# Patient Record
Sex: Female | Born: 1965 | Race: White | Hispanic: No | Marital: Married | Smoking: Never smoker
Health system: Southern US, Community
[De-identification: ages and names within clinical notes are randomized; demographics above are authoritative.]

## PROBLEM LIST (undated history)

## (undated) DIAGNOSIS — E782 Mixed hyperlipidemia: Secondary | ICD-10-CM

## (undated) DIAGNOSIS — T7840XA Allergy, unspecified, initial encounter: Secondary | ICD-10-CM

## (undated) DIAGNOSIS — K760 Fatty (change of) liver, not elsewhere classified: Secondary | ICD-10-CM

## (undated) DIAGNOSIS — R079 Chest pain, unspecified: Secondary | ICD-10-CM

## (undated) DIAGNOSIS — G4730 Sleep apnea, unspecified: Secondary | ICD-10-CM

## (undated) DIAGNOSIS — R002 Palpitations: Secondary | ICD-10-CM

## (undated) DIAGNOSIS — N979 Female infertility, unspecified: Secondary | ICD-10-CM

## (undated) DIAGNOSIS — K219 Gastro-esophageal reflux disease without esophagitis: Secondary | ICD-10-CM

## (undated) DIAGNOSIS — R1013 Epigastric pain: Secondary | ICD-10-CM

## (undated) DIAGNOSIS — F419 Anxiety disorder, unspecified: Secondary | ICD-10-CM

## (undated) DIAGNOSIS — F32A Depression, unspecified: Secondary | ICD-10-CM

## (undated) DIAGNOSIS — S0300XA Dislocation of jaw, unspecified side, initial encounter: Secondary | ICD-10-CM

## (undated) HISTORY — DX: Allergy, unspecified, initial encounter: T78.40XA

## (undated) HISTORY — DX: Dislocation of jaw, unspecified side, initial encounter: S03.00XA

## (undated) HISTORY — DX: Anxiety disorder, unspecified: F41.9

## (undated) HISTORY — DX: Mixed hyperlipidemia: E78.2

## (undated) HISTORY — DX: Palpitations: R00.2

## (undated) HISTORY — DX: Chest pain, unspecified: R07.9

## (undated) HISTORY — DX: Female infertility, unspecified: N97.9

## (undated) HISTORY — PX: BREAST BIOPSY: SHX20

## (undated) HISTORY — DX: Sleep apnea, unspecified: G47.30

## (undated) HISTORY — DX: Fatty (change of) liver, not elsewhere classified: K76.0

## (undated) HISTORY — DX: Depression, unspecified: F32.A

---
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
## 1998-08-13 ENCOUNTER — Other Ambulatory Visit: Admission: RE | Admit: 1998-08-13 | Discharge: 1998-08-13 | Payer: Self-pay | Admitting: *Deleted

## 1999-09-01 ENCOUNTER — Other Ambulatory Visit: Admission: RE | Admit: 1999-09-01 | Discharge: 1999-09-01 | Payer: Self-pay | Admitting: Obstetrics and Gynecology

## 2000-07-02 ENCOUNTER — Inpatient Hospital Stay (HOSPITAL_COMMUNITY): Admission: AD | Admit: 2000-07-02 | Discharge: 2000-07-04 | Payer: Self-pay | Admitting: Obstetrics and Gynecology

## 2000-08-08 ENCOUNTER — Other Ambulatory Visit: Admission: RE | Admit: 2000-08-08 | Discharge: 2000-08-08 | Payer: Self-pay | Admitting: Obstetrics and Gynecology

## 2001-08-23 ENCOUNTER — Other Ambulatory Visit: Admission: RE | Admit: 2001-08-23 | Discharge: 2001-08-23 | Payer: Self-pay | Admitting: Obstetrics and Gynecology

## 2002-04-19 ENCOUNTER — Encounter: Admission: RE | Admit: 2002-04-19 | Discharge: 2002-04-19 | Payer: Self-pay | Admitting: Obstetrics and Gynecology

## 2002-04-19 ENCOUNTER — Encounter: Payer: Self-pay | Admitting: Obstetrics and Gynecology

## 2002-06-13 ENCOUNTER — Ambulatory Visit (HOSPITAL_COMMUNITY): Admission: RE | Admit: 2002-06-13 | Discharge: 2002-06-13 | Payer: Self-pay | Admitting: Family Medicine

## 2002-06-13 ENCOUNTER — Encounter: Payer: Self-pay | Admitting: Family Medicine

## 2002-06-18 ENCOUNTER — Encounter (HOSPITAL_COMMUNITY): Admission: RE | Admit: 2002-06-18 | Discharge: 2002-07-18 | Payer: Self-pay | Admitting: Family Medicine

## 2002-09-11 ENCOUNTER — Other Ambulatory Visit: Admission: RE | Admit: 2002-09-11 | Discharge: 2002-09-11 | Payer: Self-pay | Admitting: Obstetrics and Gynecology

## 2002-09-18 ENCOUNTER — Ambulatory Visit (HOSPITAL_COMMUNITY): Admission: RE | Admit: 2002-09-18 | Discharge: 2002-09-18 | Payer: Self-pay | Admitting: Neurosurgery

## 2002-09-18 ENCOUNTER — Encounter: Payer: Self-pay | Admitting: Neurosurgery

## 2002-09-19 ENCOUNTER — Ambulatory Visit (HOSPITAL_COMMUNITY): Admission: RE | Admit: 2002-09-19 | Discharge: 2002-09-19 | Payer: Self-pay | Admitting: Neurosurgery

## 2002-09-20 ENCOUNTER — Encounter: Payer: Self-pay | Admitting: Neurosurgery

## 2002-10-02 ENCOUNTER — Encounter: Admission: RE | Admit: 2002-10-02 | Discharge: 2002-10-02 | Payer: Self-pay | Admitting: Neurosurgery

## 2002-10-02 ENCOUNTER — Encounter: Payer: Self-pay | Admitting: Neurosurgery

## 2002-10-15 ENCOUNTER — Encounter: Admission: RE | Admit: 2002-10-15 | Discharge: 2002-10-15 | Payer: Self-pay | Admitting: Neurosurgery

## 2002-10-15 ENCOUNTER — Encounter: Payer: Self-pay | Admitting: Neurosurgery

## 2002-11-06 ENCOUNTER — Encounter: Admission: RE | Admit: 2002-11-06 | Discharge: 2002-11-06 | Payer: Self-pay | Admitting: Neurosurgery

## 2002-11-06 ENCOUNTER — Encounter: Payer: Self-pay | Admitting: Neurosurgery

## 2003-04-04 ENCOUNTER — Encounter: Payer: Self-pay | Admitting: Radiology

## 2003-04-04 ENCOUNTER — Encounter: Payer: Self-pay | Admitting: Neurosurgery

## 2003-04-04 ENCOUNTER — Encounter: Admission: RE | Admit: 2003-04-04 | Discharge: 2003-04-04 | Payer: Self-pay | Admitting: Neurosurgery

## 2003-08-07 ENCOUNTER — Ambulatory Visit (HOSPITAL_COMMUNITY): Admission: RE | Admit: 2003-08-07 | Discharge: 2003-08-07 | Payer: Self-pay | Admitting: Internal Medicine

## 2003-09-26 ENCOUNTER — Other Ambulatory Visit: Admission: RE | Admit: 2003-09-26 | Discharge: 2003-09-26 | Payer: Self-pay | Admitting: Obstetrics and Gynecology

## 2005-01-26 ENCOUNTER — Other Ambulatory Visit: Admission: RE | Admit: 2005-01-26 | Discharge: 2005-01-26 | Payer: Self-pay | Admitting: Obstetrics and Gynecology

## 2005-01-26 ENCOUNTER — Encounter: Admission: RE | Admit: 2005-01-26 | Discharge: 2005-01-26 | Payer: Self-pay | Admitting: Obstetrics and Gynecology

## 2005-02-23 ENCOUNTER — Ambulatory Visit (HOSPITAL_COMMUNITY): Admission: RE | Admit: 2005-02-23 | Discharge: 2005-02-23 | Payer: Self-pay | Admitting: Family Medicine

## 2005-10-10 HISTORY — PX: KNEE SURGERY: SHX244

## 2006-02-16 ENCOUNTER — Encounter: Admission: RE | Admit: 2006-02-16 | Discharge: 2006-02-16 | Payer: Self-pay | Admitting: Obstetrics and Gynecology

## 2006-02-16 ENCOUNTER — Other Ambulatory Visit: Admission: RE | Admit: 2006-02-16 | Discharge: 2006-02-16 | Payer: Self-pay | Admitting: Obstetrics and Gynecology

## 2006-05-17 ENCOUNTER — Ambulatory Visit (HOSPITAL_BASED_OUTPATIENT_CLINIC_OR_DEPARTMENT_OTHER): Admission: RE | Admit: 2006-05-17 | Discharge: 2006-05-17 | Payer: Self-pay | Admitting: Orthopedic Surgery

## 2007-03-08 ENCOUNTER — Encounter: Admission: RE | Admit: 2007-03-08 | Discharge: 2007-03-08 | Payer: Self-pay | Admitting: Obstetrics and Gynecology

## 2007-11-19 ENCOUNTER — Ambulatory Visit (HOSPITAL_COMMUNITY): Admission: RE | Admit: 2007-11-19 | Discharge: 2007-11-19 | Payer: Self-pay | Admitting: Family Medicine

## 2008-04-16 ENCOUNTER — Encounter: Admission: RE | Admit: 2008-04-16 | Discharge: 2008-04-16 | Payer: Self-pay | Admitting: Obstetrics and Gynecology

## 2008-10-10 HISTORY — PX: BLADDER REPAIR: SHX76

## 2009-01-13 ENCOUNTER — Ambulatory Visit (HOSPITAL_COMMUNITY): Admission: RE | Admit: 2009-01-13 | Discharge: 2009-01-13 | Payer: Self-pay | Admitting: Family Medicine

## 2009-08-20 ENCOUNTER — Ambulatory Visit (HOSPITAL_COMMUNITY): Admission: RE | Admit: 2009-08-20 | Discharge: 2009-08-21 | Payer: Self-pay | Admitting: Obstetrics and Gynecology

## 2009-10-08 ENCOUNTER — Encounter: Admission: RE | Admit: 2009-10-08 | Discharge: 2009-10-08 | Payer: Self-pay | Admitting: Obstetrics and Gynecology

## 2010-09-27 ENCOUNTER — Ambulatory Visit: Payer: Self-pay | Admitting: Internal Medicine

## 2010-10-13 ENCOUNTER — Encounter
Admission: RE | Admit: 2010-10-13 | Discharge: 2010-10-13 | Payer: Self-pay | Source: Home / Self Care | Attending: Obstetrics and Gynecology | Admitting: Obstetrics and Gynecology

## 2010-10-20 ENCOUNTER — Encounter
Admission: RE | Admit: 2010-10-20 | Discharge: 2010-10-20 | Payer: Self-pay | Source: Home / Self Care | Attending: Obstetrics and Gynecology | Admitting: Obstetrics and Gynecology

## 2010-10-31 ENCOUNTER — Encounter: Payer: Self-pay | Admitting: Obstetrics and Gynecology

## 2010-12-02 ENCOUNTER — Encounter (HOSPITAL_BASED_OUTPATIENT_CLINIC_OR_DEPARTMENT_OTHER): Payer: BC Managed Care – PPO | Admitting: Internal Medicine

## 2010-12-02 ENCOUNTER — Ambulatory Visit (HOSPITAL_COMMUNITY)
Admission: RE | Admit: 2010-12-02 | Discharge: 2010-12-02 | Disposition: A | Discharge status: Home / Self Care | Payer: BC Managed Care – PPO | Source: Ambulatory Visit | Attending: Internal Medicine | Admitting: Internal Medicine

## 2010-12-02 DIAGNOSIS — E785 Hyperlipidemia, unspecified: Secondary | ICD-10-CM | POA: Insufficient documentation

## 2010-12-02 DIAGNOSIS — K921 Melena: Secondary | ICD-10-CM | POA: Insufficient documentation

## 2010-12-02 DIAGNOSIS — K5909 Other constipation: Secondary | ICD-10-CM | POA: Insufficient documentation

## 2010-12-02 DIAGNOSIS — K644 Residual hemorrhoidal skin tags: Secondary | ICD-10-CM | POA: Insufficient documentation

## 2010-12-02 DIAGNOSIS — K5900 Constipation, unspecified: Secondary | ICD-10-CM

## 2010-12-25 NOTE — Op Note (Signed)
  NAMEKEELIE, Nancy Robertson                ACCOUNT NO.:  [id number]  MEDICAL RECORD NO.:  [id number]           PATIENT TYPE:  O  LOCATION:  DAYP                          FACILITY:  [facility]  PHYSICIAN:  Lionel December, M.D.    DATE OF BIRTH:  [birth year]  DATE OF PROCEDURE: DATE OF DISCHARGE:                              OPERATIVE REPORT   PROCEDURE:  Colonoscopy.  INDICATION:  Randal is a 45 year old Caucasian female who had recurrent hematochezia starting some time in July last year through end of November.  She recalls that she was taking lot of Advil during this time.  Most of these episodes were associated with constipation.  She is using the MiraLax on p.r.n. basis.  She is undergoing diagnostic colonoscopy.  FAMILY HISTORY:  Negative for colorectal carcinoma.  Procedure risk reviewed with the patient, informed consent was obtained.  MEDS FOR CONSCIOUS SEDATION:  Demerol 50 mg IV, Versed 10 mg IV.  FINDINGS:  Procedure performed in endoscopy suite.  The patient's vital signs and O2 sat were monitored during the procedure and remained stable.  The patient was placed in left lateral recumbent position, rectal examination performed.  No abnormality noted on external or digital exam.  Pentax videoscope was placed through rectum and advanced under vision into sigmoid colon and beyond.  Preparation was satisfactory.  Scope was passed into cecum which was identified by appendiceal orifice and ileocecal valve.  Pictures were taken for the record.  As the scope was withdrawn, colonic mucosa was carefully examined.  No polyps, tumor, masses, or diverticular changes were noted. Rectal mucosa similarly was normal.  Scope was retroflexed to examine anorectal junction and small hemorrhoids noted below the dentate line. Endoscope was then withdrawn.  Withdrawal time was 7 minutes.  The patient tolerated the procedure well.  FINAL DIAGNOSIS:  Normal colonoscopy except external  hemorrhoids.  RECOMMENDATIONS:  She is continue high-fiber diet.  She can take MiraLax daily or every other day, but she can titrate the dose down.     Lionel December, M.D.     NR/MEDQ  D:  12/02/2010  T:  12/02/2010  Job:  [id number]  cc:   Donna Bernard, M.D. Fax: [phone]  Electronically Signed by Lionel December M.D. on 12/25/2010 02:21:49 PM

## 2011-01-12 LAB — BASIC METABOLIC PANEL
BUN: 6 mg/dL (ref 6–23)
CO2: 26 mEq/L (ref 19–32)
Calcium: 8.5 mg/dL (ref 8.4–10.5)
Chloride: 109 mEq/L (ref 96–112)
Creatinine, Ser: 0.56 mg/dL (ref 0.4–1.2)
GFR calc Af Amer: >60 mL/min (ref >60)
GFR calc non Af Amer: >60 mL/min (ref >60)
Glucose, Bld: 109 mg/dL — ABNORMAL HIGH (ref 70–99)
Potassium: 3.7 mEq/L (ref 3.5–5.1)
Sodium: 138 mEq/L (ref 135–145)

## 2011-01-12 LAB — CBC
HCT: 36.3 % (ref 36.0–46.0)
HCT: 41.5 % (ref 36.0–46.0)
Hemoglobin: 12.3 g/dL (ref 12.0–15.0)
Hemoglobin: 14.1 g/dL (ref 12.0–15.0)
MCHC: 33.9 g/dL (ref 30.0–36.0)
MCHC: 34 g/dL (ref 30.0–36.0)
MCV: 93 fL (ref 78.0–100.0)
MCV: 93.4 fL (ref 78.0–100.0)
Platelets: 191 10*3/uL (ref 150–400)
Platelets: 199 10*3/uL (ref 150–400)
RBC: 3.89 MIL/uL (ref 3.87–5.11)
RBC: 4.47 MIL/uL (ref 3.87–5.11)
RDW: 12.5 % (ref 11.5–15.5)
RDW: 12.7 % (ref 11.5–15.5)
WBC: 6.1 10*3/uL (ref 4.0–10.5)
WBC: 9.8 10*3/uL (ref 4.0–10.5)

## 2011-01-12 LAB — HCG, SERUM, QUALITATIVE: Preg, Serum: NEGATIVE

## 2011-02-22 NOTE — Procedures (Signed)
Nancy Robertson, Nancy Robertson                ACCOUNT NO.:  [id number]   MEDICAL RECORD NO.:  [id number]          PATIENT TYPE:  OUT   LOCATION:  DFTL                          FACILITY:  [facility]   PHYSICIAN:  Scott A. Gerda Diss, MD    DATE OF BIRTH:  1966/09/03   DATE OF PROCEDURE:  01/13/2009  DATE OF DISCHARGE:                                  STRESS TEST   CHIEF COMPLAINT:  Left upper chest discomfort, left arm pain with  activity multiple times over the past weekend, also family history of  heart disease.   RESTING EKG:  No acute ST-segment changes, normal sinus rhythm.   ARRHYTHMIAS TO EXERCISE:  None.   SYMPTOMATOLOGY:  The patient did get a little short of breath when she  has had maximal heart rate of 176.   Blood pressure response to exercise had a mild increase in blood  pressure, but no significant hypertensive response with exercise.   ST-SEGMENT RESPONSE TO EXERCISE:  The patient did not have any  significant ST-segment depressions, they were normal and in recovery  phase they looked great.  No acute ST-segment changes were noted.   RECOVERY PHASE:  Uneventful.   INTERPRETATION:  Normal stress test.  No sign of cardiac involvement,  should do fine overall notifies ongoing problems.      Scott A. Gerda Diss, MD  Electronically Signed     SAL/MEDQ  D:  01/13/2009  T:  01/13/2009  Job:  [id number]

## 2011-02-25 NOTE — Op Note (Signed)
Nancy Robertson, MOLDEN                          ACCOUNT NO.:  [id number]   MEDICAL RECORD NO.:  [id number]                   PATIENT TYPE:  AMB   LOCATION:  DAY                                  FACILITY:  [facility]   PHYSICIAN:  Lionel December, M.D.                 DATE OF BIRTH:  1965/12/15   DATE OF PROCEDURE:  08/07/2003  DATE OF DISCHARGE:                                 OPERATIVE REPORT   PROCEDURE:  Esophagogastroduodenoscopy followed by total colonoscopy.   INDICATIONS FOR PROCEDURE:  Aariana is a 45 year old Caucasian female with  chronic back pain who is on Celebrex who has nocturnal regurgitation,  epigastric pain, as well as lower abdominal pain, constipation, and  intermittent hematochezia.  She is doing better with MiraLax.  She is  undergoing diagnostic EGD and colonoscopy.  Family history is significant  for colonic adenomas in her father.  The procedure risks were reviewed with  the patient, and informed consent was obtained.   PREOPERATIVE MEDICATIONS:  Cetacaine spray for pharyngeal topical  anesthesia, Demerol 40 mg IV, Versed 17 mg IV in divided doses.   FINDINGS:  The procedure was performed in the endoscopy suite.  The  patient's vital signs and O2 saturations were monitored during the procedure  and remained stable.   PROCEDURE #1, ESOPHAGOGASTRODUODENOSCOPY:  The patient was placed in the  left lateral recumbent position, and the Olympus videoscope was passed via  the oropharynx without any difficulty into the esophagus.   Esophagus:  The mucosa of the esophagus was normal throughout.  The  squamocolumnar junction was unremarkable.  No hernia was noted.   Stomach:  It was empty and distended very well with insufflation.  The folds  of the proximal stomach were normal.  Examination of  the mucosa at body,  antrum, pyloric channel, as well as angularis, fundus, and cardia and was  normal.   Duodenum:  Examination of the bulb revealed normal mucosa.  The scope  was  passed to the second part of the duodenum where the mucosa and folds were  normal.  The endoscope was withdrawn and the patient prepared for procedure  #2.   PROCEDURE #2, TOTAL COLONOSCOPY:  Rectal was examination performed.  No  abnormality noted on external or digital exam.  The Olympus videoscope was  placed into the rectum and advanced into the region of the sigmoid colon and  beyond.  The preparation was satisfactory.  The scope was passed into the  cecum which was identified by the appendiceal orifice and ileocecal valve.  Pictures were taken for the record.  As the scope was withdrawn, the colonic  mucosa was once again carefully examined and was normal throughout.  The  rectal mucosa similarly was normal.  The scope was retroflexed to examine  the anorectal junction, and small hemorrhoids were noted below the dentate  line.  The endoscope was straightened and withdrawn.  The patient tolerated  the procedures well.    FINAL DIAGNOSES:  1. Normal esophagogastroduodenoscopy.  No evidence of peptic ulcer disease     secondary to chronic nonsteroidal anti-inflammatory drug therapy or     endoscopic evidence of reflux esophagitis.  2. Small external hemorrhoids; otherwise, normal colonoscopy.   RECOMMENDATIONS:  1. She should continue antireflux measures and use Nexium on demand.  2. High fiber diet and Citrucel one tablespoon full daily.  3. She will stay on MiraLax.  However, when MiraLax quits working, will     consider Zelnorm.  4. She will return to our office on an as needed basis.      ___________________________________________                                            Lionel December, M.D.   NR/MEDQ  D:  08/07/2003  T:  08/07/2003  Job:  [id number]   cc:   Lorin Picket A. Gerda Diss, M.D.  [address], [address]  [place]  [place] [address]  Fax: [phone]

## 2011-02-25 NOTE — Consult Note (Signed)
[name], Robertson                            ACCOUNT NO.:  [id number]   MEDICAL RECORD NO.:  [id number]                  PATIENT TYPE:   LOCATION:                                       FACILITY:  [facility]   PHYSICIAN:  Lionel December, M.D.                 DATE OF BIRTH:  August 07, 1966   DATE OF CONSULTATION:  07/17/2003  DATE OF DISCHARGE:                                   CONSULTATION   GASTROENTEROLOGY CONSULTATION:   PHYSICIAN REQUESTING CONSULTATION:  Scott A. Gerda Diss, M.D.   REASON FOR CONSULTATION:  Abdominal pain.   HISTORY OF PRESENT ILLNESS:  Nancy Robertson is a pleasant 45 year old Caucasian female  patient of Dr. Lilyan Punt who presents today for further evaluation of  abdominal pain.  She states that she has been having constipation for years.  She generally has a bowel movement every 3 days.  In June and July she was  noticing bright red blood per rectum, particularly on the outside of the  stool as well as on the toilet tissue.  She is also having some internal  rectal discomfort.  She notes that she was taking a significant amount of  Aleve during this time for back pain.  In the past she had been on  ibuprofen, Advil, and Vioxx.  Currently she is taking Celebrex.  She has had  some lower abdominal discomfort.  Sometimes crampy and bloating-type  feeling.  She massages her abdomen and it seems to help.  Denies any nausea  or vomiting or heartburn.  She saw Dr. Lilyan Punt who obtained blood work  which revealed normal CBC, MET-7, TSH.  He started her on MiraLax which is  working Adult nurse.  She is now having at least one to two bowel movements  daily.  She has seen no further rectal bleeding.  Her lower abdominal  discomfort has improved as well.  The patient tells me that she was told by  Dr. Marina Goodell in 1998 that she needed to have a colonoscopy in 3 years.  He had  performed colonoscopy at that time but she does not recall the results.  We  are trying to obtain a copy of this.   Her father, Aurelio Brash, whom we have  seen as a patient has had multiple precancerous polyps removed.  There is no  colon cancer in the family, however.  No history of inflammatory bowel  disease.   CURRENT MEDICATIONS:  1. Celebrex 200 mg once daily.  2. MiraLax 17 g at bedtime.  3. Singulair 10 mg once daily.  4. Entex seasonal.  5. Nexium 40 mg once daily.   ALLERGIES:  PENICILLIN and ERYTHROMYCIN.   PAST MEDICAL HISTORY:  Internal hemorrhoids.  L5-S1 disk problem receiving  epidural and facet injections by Dr. Newell Coral in [place].  She is  contemplating surgery.  She has been on multiple NSAIDs over the last 1-2  years, currently on Celebrex.  History of seasonal allergies with associated  asthma.   PAST SURGICAL HISTORY:  Colonoscopy as outlined above.   FAMILY HISTORY:  Father has a history of precancerous polyps as outlined  above.  He is also a diabetic.  No family history of inflammatory bowel  disease.   SOCIAL HISTORY:  She has been married for 20 years and has two children.  She works for [employer].  Denies any tobacco or alcohol  use.   REVIEW OF SYSTEMS:  GI:  Please see HPI.  GENERAL:  Denies any weight loss.  CARDIOPULMONARY:  Denies any chest pain or shortness of breath.   PHYSICAL EXAMINATION:  VITAL SIGNS:  Weight 166, height 5 feet 6-1/2 inches,  temperature 98.3, blood pressure 110/70, pulse 74.  GENERAL:  Pleasant, well-nourished, well-developed Caucasian female in no  acute distress.  SKIN:  Warm and dry; no jaundice.  HEENT:  Conjunctivae are pink.  Sclerae are nonicteric.  Oropharyngeal  mucosa moist and pink; no lesions, erythema, or exudate.  No lymphadenopathy  or thyromegaly.  CHEST:  Lungs are clear to auscultation.  CARDIAC:  Regular rate and rhythm.  Normal S1, S2.  No murmurs, rubs, or  gallops.  ABDOMEN:  Positive bowel sounds, soft, nondistended.  She has mild to  moderate tenderness in the lower abdomen to deep  palpation (right lower  quadrant greater than left lower quadrant).  No rebound tenderness or  guarding.  No organomegaly or masses.  EXTREMITIES:  No edema.   IMPRESSION:  [name] is a pleasant 45 year old female who has several-month  history of lower abdominal discomfort, chronic constipation, chronic  intermittent hematochezia.  As outlined above, bleeding seemed to be worse  couple months ago while she was on Aleve.  She is now on MiraLax and moving  her bowels regularly and is feeling better.  Her rectal bleeding has now  stopped.  She is requesting colonoscopy at this time for further evaluation  of her symptoms.  In addition, she was told by Dr. Marina Goodell she needed a  colonoscopy every 3 years.  I feel it is reasonable at this time to further  evaluate her symptoms to proceed with colonoscopy.   PLAN:  1. Colonoscopy.  2. We will obtain copy of colonoscopy report done in 1998 by Dr. Marina Goodell.  3. She will continue MiraLax for now, prescription given for 527 g take 17 g     p.o. at bedtime with three refills.  4. She will continue Nexium 40 mg daily for now.   I would like to thank Dr. Lilyan Punt for allowing [place] to take part in the  care of this patient.     ________________________________________  ___________________________________________  Tana Coast, P.A.                         Lionel December, M.D.   LL/MEDQ  D:  07/17/2003  T:  07/17/2003  Job:  [id number]   cc:   Lorin Picket A. Gerda Diss, M.D.  [address], [address]  [place]  [place] [address]  Fax: [phone]

## 2011-02-25 NOTE — Op Note (Signed)
[name], Nancy Robertson                ACCOUNT NO.:  [id number]   MEDICAL RECORD NO.:  [id number]          PATIENT TYPE:  AMB   LOCATION:  DSC                          FACILITY:  [facility]   PHYSICIAN:  Mila Homer. Sherlean Foot, M.D. DATE OF BIRTH:  [birth year]   DATE OF PROCEDURE:  05/17/2006  DATE OF DISCHARGE:                                 OPERATIVE REPORT   SURGEON:  Mila Homer. Sherlean Foot, M.D.   ASSISTANT:  None.   PREOPERATIVE DIAGNOSIS:  Right knee medial meniscal tear.   POSTOPERATIVE DIAGNOSIS:  Right knee medial meniscal tear.   PROCEDURE:  Right knee arthroscopy, partial medial meniscectomy.   ANESTHESIA:  MAC.   INDICATIONS FOR PROCEDURE:  The patient is a 45 year old white female with  mechanical symptoms and MRI evidence of a small tear.  Informed consent was  obtained.   DESCRIPTION OF PROCEDURE:  The patient was taken to the operating room and  administered MAC anesthesia.  The right leg was prepped and draped in the  usual sterile fashion.  Inferolateral and inferomedial portals were created  with a #11 blade, blunt trocar and cannula.  Diagnostic arthroscopy revealed  no chondromalacia in any of the 3 compartments.  The ACL and the PCL  appeared normal.  The lateral compartment was completely pristine.  The  medial compartment had a large posterior horn medial meniscus tear.  This  was inconsistent with the MRI, and it actually extended from the root of the  meniscus on around to the midportion at the insertion of the MCL and the  peripheral fibers.  Obviously, this was the pathologic lesion.  A posterior  horn partial medial meniscectomy was performed with straight and upbiting  basket forceps and a Biochemist, clinical.  I then blunted the tissue back  further with an ArthroCare Capture Wand.  I then lavaged the knee, closed  with 4-0 nylon sutures, dressed with Adaptic, 4 x 4, sterile Webril and an  Ace wrap.  Complications:  None.  Drains:  None.     ______________________________  Mila Homer. Sherlean Foot, M.D.     SDL/MEDQ  D:  05/17/2006  T:  05/18/2006  Job:  [id number]

## 2011-10-13 ENCOUNTER — Other Ambulatory Visit: Payer: Self-pay | Admitting: Obstetrics and Gynecology

## 2011-10-27 ENCOUNTER — Encounter (HOSPITAL_COMMUNITY): Payer: Self-pay | Admitting: Pharmacist

## 2011-10-28 NOTE — Patient Instructions (Addendum)
   Your procedure is scheduled on: Thursday, Jan 31st  Enter through the Main Entrance of [facility] at: 8am Pick up the phone at the desk and dial [phone] and inform us of your arrival.  Please call this number if you have any problems the morning of surgery: [phone]  Remember: Do not eat food after midnight: Wednesday Do not drink clear liquids after: Wednesday Take these medicines the morning of surgery with a SIP OF WATER:  Do not wear jewelry, make-up, or FINGER nail polish Do not wear lotions, powders, perfumes or deodorant. Do not shave 48 hours prior to surgery. Do not bring valuables to the hospital.  Leave suitcase in the car. After Surgery it may be brought to your room. For patients being admitted to the hospital, checkout time is 11:00am the day of discharge.  Patients discharged on the day of surgery will not be allowed to drive home.     Remember to use your hibiclens as instructed.Please shower with 1/2 bottle the evening before your surgery and the other 1/2 bottle the morning of surgery.  20 Mairi KIALEE KHAM  10/31/2011   Your procedure is scheduled on:  11/10/11  Enter through the Main Entrance of [facility] at 8 AM.  Pick up the phone at the desk and dial [phone].   Call this number if you have problems the morning of surgery: [phone]   Remember:   Do not eat food:After Midnight.  Do not drink clear liquids: After Midnight.  Take these medicines the morning of surgery with A SIP OF WATER: Protonix and bring Inhaler   Do not wear jewelry, make-up or nail polish.  Do not wear lotions, powders, or perfumes. You may wear deodorant.  Do not shave 48 hours prior to surgery.  Do not bring valuables to the hospital.  Contacts, dentures or bridgework may not be worn into surgery.  Leave suitcase in the car. After surgery it may be brought to your room.  For patients admitted to the hospital, checkout time is 11:00 AM the day of discharge.   Patients  discharged the day of surgery will not be allowed to drive home.  Name and phone number of your driver: NA  Special Instructions: CHG Shower Use Special Wash: 1/2 bottle night before surgery and 1/2 bottle morning of surgery.   Please read over the following fact sheets that you were given: MRSA Information

## 2011-10-29 NOTE — H&P (Addendum)
Nancy Robertson, Nancy Robertson                ACCOUNT NO.:  [id number]  MEDICAL RECORD NO.:  [id number]  LOCATION:  PERIO                         FACILITY:  [facility]  PHYSICIAN:  Osborn Coho, M.D.   DATE OF BIRTH:  [birth year]  DATE OF ADMISSION:  10/12/2011 DATE OF DISCHARGE:                             HISTORY & PHYSICAL   Nancy Robertson is a 46 year old married white female, para 2-0-0-2, presenting for a total vaginal hysterectomy because of symptomatic uterine fibroids and menorrhagia.  Nancy Robertson reports that traditionally her menstrual cycles have been between 21-35 days. However, for Nancy last several months, she has had a menstrual-like flow every 12 days that would last 7 days each time.  During that time, she is required to change a pad every 1 to 1-1/2 hours, but she admits that there is no cramping associated with these events.  She reports feeling bloated, irritable, and surges of heat, but denies any nausea, vomiting, diarrhea, dyspareunia, changes in her bowel movements or urinary function.  A pelvic ultrasound done on October 05, 2011, showed a uterus measuring 10.6 x 6.82 x 5.53 cm containing a fundal submucosal fibroid measuring 1.78 x 1.43 x 1.71 cm and a left lateral submucosal fibroid measuring 3.61 x 3.50 x 2.84 cm.  Both of Nancy Robertson's ovaries appeared normal on that study.  Additionally, Nancy Robertson's Pap smear at that same time showed epithelial cell abnormality-glandular cells with atypical endocervical cells, not otherwise specified.  Consequently, Nancy Robertson underwent a colposcopy and endometrial biopsy with results showing chronic cervicitis and endocervicitis with reactive epithelial changes.  There were no features, however, indicative of hyperplasia atypia, or malignancy.  Both medical and surgical options were given to Nancy Robertson for management of her symptom , however, given that she has completed her childbearing and Nancy life altering nature of her symptoms,  Nancy Robertson desires to proceed with definitive therapy in Nancy form of hysterectomy.  PAST MEDICAL HISTORY:  OB History:  Gravida 2, para 2-0-0-2.  Nancy Robertson had 2 spontaneous vaginal births, weighing 8 pounds 1 ounce and 8 pounds 5 ounces respectively.  Menarche at 46 years old.  Last menstrual period September 22, 2011.  Nancy Robertson uses vasectomy as her method of contraception.  She denies any history of sexually transmitted diseases.  Nancy Robertson's only abnormal Pap smear was that mentioned in her history of present illness.  Medical History:  Mitral valve prolapse, asthma, endometriosis, stress urinary incontinence, anal lichen simplex, sacral back pain, and hyperprolactinemia.  Surgical History:  1989 laparoscopy, laparoscopic ovarian cystectomy, and endometriosis; 2007 right knee surgery; 2010 placement of tension- free vaginal tape and anterior-posterior colporrhaphy.  Denies any problems with anesthesia or history of blood transfusions.  FAMILY HISTORY:  Cardiovascular disease, asthma, emphysema, hypertension, diabetes, ovarian cancer (maternal aunt in her fourth decade), colon cancer, and renal stones.  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  HABITS:  She rarely consumes alcohol.  She denies any tobacco or illicit drug use.  CURRENT MEDICATIONS: 1. Omega-3 essential fatty acids. 2. Detrol LA 4 mg daily. 3. Protonix 40 mg daily.  Nancy  Robertson is allergic to PENICILLIN, but does not know her reaction. She also admits to sensitivity to MACROLIDES in that they cause severe gastrointestinal upset.  Denies any sensitivities to latex shellfish, soy, or peanuts.  REVIEW OF SYSTEMS:  Nancy Robertson wears glasses.  She occasionally has heartburn.  She has difficulty from time to time stopping her urine flow, but she denies any chest pain, shortness of breath, headache, vision changes, nausea,  vomiting, diarrhea, dysuria, hematuria, incontinence myalgias, arthralgias, skin rashes and except as is mentioned in history of present illness, Nancy Robertson's review of systems is otherwise negative.  PHYSICAL EXAMINATION:  VITAL SIGNS:  Blood pressure 108/66, pulse is 68, temperature 98.9 degrees Fahrenheit orally, weight is 185, height is 5 feet 6 inches tall, body mass index is 29.4. NECK:  Supple without masses.  There is no thyromegaly or cervical adenopathy. HEART:  Regular rate and rhythm. LUNGS:  Clear. BACK:  No CVA tenderness. ABDOMEN:  No tenderness, guarding, rebound, or organomegaly.  There are no palpable masses. EXTREMITIES:  No clubbing, cyanosis, or edema. PELVIC:  EGBUS is normal.  Vagina is normal.  Cervix is nontender without lesions.  Uterus appears normal size, shape, and consistency. It is retroverted.  There is no tenderness.  Adnexa, no tenderness or masses.  IMPRESSION: 1. Menorrhagia. 2. Uterine fibroids.  DISPOSITION:  A discussion was held with Nancy Robertson regarding indications for her procedure along with its risks which include but are not limited to reaction to anesthesia, damage to adjacent organs, infection, excessive bleeding, and Nancy possible need for an open abdominal incision.  Nancy Robertson verbalized understanding of these risks and she has consented to proceed with a total vaginal hysterectomy with Nancy possibility of a bilateral salpingo-oophorectomy and Nancy possibility of cystoscopy at [facility] on November 10, 2011, at 9:30 a.m.     Kalisa Girtman J. Lowell Guitar, P.A.-C   ______________________________ Osborn Coho, M.D.    EJP/MEDQ  D:  10/28/2011  T:  10/29/2011  Job:  [id number]  No change in H&P. No change in plan of care. Pt wants ovaries and tubes removed even if I need to convert to laparoscopy to remove them.  Pt understands Nancy risks/benefits/alternatives of removing ovaries and says they are already failing and  she just wants them out and has done a lot of research regarding Nancy issue. - AYR

## 2011-10-31 ENCOUNTER — Encounter (HOSPITAL_COMMUNITY): Payer: Self-pay

## 2011-10-31 ENCOUNTER — Encounter (HOSPITAL_COMMUNITY)
Admission: RE | Admit: 2011-10-31 | Discharge: 2011-10-31 | Disposition: A | Discharge status: Home / Self Care | Payer: BC Managed Care – PPO | Source: Ambulatory Visit | Attending: Obstetrics and Gynecology | Admitting: Obstetrics and Gynecology

## 2011-10-31 HISTORY — DX: Gastro-esophageal reflux disease without esophagitis: K21.9

## 2011-10-31 LAB — CBC
HCT: 42.1 % (ref 36.0–46.0)
Hemoglobin: 14.3 g/dL (ref 12.0–15.0)
MCH: 30.7 pg (ref 26.0–34.0)
MCHC: 34 g/dL (ref 30.0–36.0)
MCV: 90.3 fL (ref 78.0–100.0)
Platelets: 205 10*3/uL (ref 150–400)
RBC: 4.66 MIL/uL (ref 3.87–5.11)
RDW: 13 % (ref 11.5–15.5)
WBC: 6.3 10*3/uL (ref 4.0–10.5)

## 2011-10-31 LAB — SURGICAL PCR SCREEN
MRSA, PCR: NEGATIVE
Staphylococcus aureus: NEGATIVE

## 2011-11-09 MED ORDER — GENTAMICIN SULFATE 40 MG/ML IJ SOLN
INTRAVENOUS | Status: AC
  Administered 2011-11-10: 100 mL via INTRAVENOUS
  Filled 2011-11-09: qty 2.5

## 2011-11-10 ENCOUNTER — Encounter (HOSPITAL_COMMUNITY): Payer: Self-pay | Admitting: Anesthesiology

## 2011-11-10 ENCOUNTER — Encounter (HOSPITAL_COMMUNITY): Payer: Self-pay | Admitting: *Deleted

## 2011-11-10 ENCOUNTER — Encounter (HOSPITAL_COMMUNITY)
Admission: RE | Disposition: A | Discharge status: Home / Self Care | Payer: Self-pay | Source: Ambulatory Visit | Attending: Obstetrics and Gynecology

## 2011-11-10 ENCOUNTER — Ambulatory Visit (HOSPITAL_COMMUNITY)
Admission: RE | Admit: 2011-11-10 | Discharge: 2011-11-11 | Disposition: A | Discharge status: Home / Self Care | Payer: BC Managed Care – PPO | Source: Ambulatory Visit | Attending: Obstetrics and Gynecology | Admitting: Obstetrics and Gynecology

## 2011-11-10 ENCOUNTER — Ambulatory Visit (HOSPITAL_COMMUNITY): Payer: BC Managed Care – PPO | Admitting: Anesthesiology

## 2011-11-10 ENCOUNTER — Other Ambulatory Visit: Payer: Self-pay | Admitting: Obstetrics and Gynecology

## 2011-11-10 DIAGNOSIS — D251 Intramural leiomyoma of uterus: Secondary | ICD-10-CM | POA: Insufficient documentation

## 2011-11-10 DIAGNOSIS — D250 Submucous leiomyoma of uterus: Secondary | ICD-10-CM | POA: Insufficient documentation

## 2011-11-10 DIAGNOSIS — N831 Corpus luteum cyst of ovary, unspecified side: Secondary | ICD-10-CM | POA: Insufficient documentation

## 2011-11-10 DIAGNOSIS — N920 Excessive and frequent menstruation with regular cycle: Secondary | ICD-10-CM | POA: Insufficient documentation

## 2011-11-10 DIAGNOSIS — D252 Subserosal leiomyoma of uterus: Secondary | ICD-10-CM | POA: Insufficient documentation

## 2011-11-10 HISTORY — PX: CYSTOSCOPY: SHX5120

## 2011-11-10 HISTORY — PX: SALPINGOOPHORECTOMY: SHX82

## 2011-11-10 HISTORY — PX: VAGINAL HYSTERECTOMY: SHX2639

## 2011-11-10 LAB — HCG, SERUM, QUALITATIVE: Preg, Serum: NEGATIVE

## 2011-11-10 SURGERY — HYSTERECTOMY, VAGINAL
Anesthesia: General | Wound class: Clean Contaminated

## 2011-11-10 MED ORDER — FENTANYL CITRATE 0.05 MG/ML IJ SOLN
INTRAMUSCULAR | Status: AC
  Administered 2011-11-10: 50 ug via INTRAVENOUS
  Filled 2011-11-10: qty 2

## 2011-11-10 MED ORDER — FENTANYL CITRATE 0.05 MG/ML IJ SOLN
INTRAMUSCULAR | Status: DC | PRN
  Administered 2011-11-10: 50 ug via INTRAVENOUS
  Administered 2011-11-10 (×2): 100 ug via INTRAVENOUS

## 2011-11-10 MED ORDER — ONDANSETRON HCL 4 MG/2ML IJ SOLN
INTRAMUSCULAR | Status: AC
  Administered 2011-11-10: 4 mg via INTRAVENOUS
  Filled 2011-11-10: qty 2

## 2011-11-10 MED ORDER — HYDROMORPHONE HCL PF 1 MG/ML IJ SOLN
INTRAMUSCULAR | Status: AC
  Administered 2011-11-10: 0.5 mg via INTRAVENOUS
  Filled 2011-11-10: qty 1

## 2011-11-10 MED ORDER — LIDOCAINE HCL (CARDIAC) 20 MG/ML IV SOLN
INTRAVENOUS | Status: AC
  Filled 2011-11-10: qty 5

## 2011-11-10 MED ORDER — INDIGOTINDISULFONATE SODIUM 8 MG/ML IJ SOLN
INTRAMUSCULAR | Status: AC
  Filled 2011-11-10: qty 5

## 2011-11-10 MED ORDER — PROPOFOL 10 MG/ML IV EMUL
INTRAVENOUS | Status: AC
  Filled 2011-11-10: qty 20

## 2011-11-10 MED ORDER — ONDANSETRON HCL 4 MG/2ML IJ SOLN
INTRAMUSCULAR | Status: AC
  Filled 2011-11-10: qty 2

## 2011-11-10 MED ORDER — ROCURONIUM BROMIDE 100 MG/10ML IV SOLN
INTRAVENOUS | Status: DC | PRN
  Administered 2011-11-10: 40 mg via INTRAVENOUS

## 2011-11-10 MED ORDER — GLYCOPYRROLATE 0.2 MG/ML IJ SOLN
INTRAMUSCULAR | Status: DC | PRN
  Administered 2011-11-10 (×2): 0.2 mg via INTRAVENOUS

## 2011-11-10 MED ORDER — LIDOCAINE HCL (CARDIAC) 20 MG/ML IV SOLN
INTRAVENOUS | Status: DC | PRN
  Administered 2011-11-10: 100 mg via INTRAVENOUS

## 2011-11-10 MED ORDER — INDIGOTINDISULFONATE SODIUM 8 MG/ML IJ SOLN
INTRAMUSCULAR | Status: DC | PRN
  Administered 2011-11-10: 3 mL via INTRAVENOUS

## 2011-11-10 MED ORDER — FENTANYL CITRATE 0.05 MG/ML IJ SOLN
INTRAMUSCULAR | Status: AC
  Filled 2011-11-10: qty 5

## 2011-11-10 MED ORDER — PROPOFOL 10 MG/ML IV EMUL
INTRAVENOUS | Status: DC | PRN
  Administered 2011-11-10: 200 mg via INTRAVENOUS

## 2011-11-10 MED ORDER — ALBUTEROL SULFATE HFA 108 (90 BASE) MCG/ACT IN AERS: 2.0000 | INHALATION_SPRAY | Freq: Four times a day (QID) | RESPIRATORY_TRACT | Status: DC | PRN

## 2011-11-10 MED ORDER — HYDROMORPHONE HCL PF 1 MG/ML IJ SOLN
INTRAMUSCULAR | Status: AC
  Filled 2011-11-10: qty 1

## 2011-11-10 MED ORDER — DOCUSATE SODIUM 100 MG PO CAPS: 100.0000 mg | ORAL_CAPSULE | Freq: Two times a day (BID) | ORAL | Status: DC | PRN

## 2011-11-10 MED ORDER — METOCLOPRAMIDE HCL 5 MG/ML IJ SOLN
10.0000 mg | Freq: Once | INTRAMUSCULAR | Status: AC | PRN
  Administered 2011-11-10: 10 mg via INTRAVENOUS

## 2011-11-10 MED ORDER — VASOPRESSIN 20 UNIT/ML IJ SOLN
INTRAMUSCULAR | Status: DC | PRN
  Administered 2011-11-10: 20 [IU] via INTRAMUSCULAR

## 2011-11-10 MED ORDER — KETOROLAC TROMETHAMINE 30 MG/ML IJ SOLN
15.0000 mg | Freq: Once | INTRAMUSCULAR | Status: AC | PRN
  Administered 2011-11-10: 30 mg via INTRAVENOUS

## 2011-11-10 MED ORDER — PANTOPRAZOLE SODIUM 40 MG PO TBEC
40.0000 mg | DELAYED_RELEASE_TABLET | Freq: Every day | ORAL | Status: DC
  Administered 2011-11-11: 40 mg via ORAL
  Filled 2011-11-10 (×2): qty 1

## 2011-11-10 MED ORDER — FENTANYL CITRATE 0.05 MG/ML IJ SOLN
25.0000 ug | INTRAMUSCULAR | Status: DC | PRN
  Administered 2011-11-10 (×3): 50 ug via INTRAVENOUS

## 2011-11-10 MED ORDER — MIDAZOLAM HCL 5 MG/5ML IJ SOLN
INTRAMUSCULAR | Status: DC | PRN
  Administered 2011-11-10: 2 mg via INTRAVENOUS

## 2011-11-10 MED ORDER — SODIUM CHLORIDE 0.9 % IJ SOLN: 9.0000 mL | INTRAMUSCULAR | Status: DC | PRN

## 2011-11-10 MED ORDER — KETOROLAC TROMETHAMINE 30 MG/ML IJ SOLN
INTRAMUSCULAR | Status: AC
  Administered 2011-11-10: 30 mg via INTRAVENOUS
  Filled 2011-11-10: qty 1

## 2011-11-10 MED ORDER — DIPHENHYDRAMINE HCL 12.5 MG/5ML PO ELIX: 12.5000 mg | ORAL_SOLUTION | Freq: Four times a day (QID) | ORAL | Status: DC | PRN

## 2011-11-10 MED ORDER — DIPHENHYDRAMINE HCL 50 MG/ML IJ SOLN: 12.5000 mg | Freq: Four times a day (QID) | INTRAMUSCULAR | Status: DC | PRN

## 2011-11-10 MED ORDER — STERILE WATER FOR IRRIGATION IR SOLN
Status: DC | PRN
  Administered 2011-11-10: 1000 mL via INTRAVESICAL

## 2011-11-10 MED ORDER — NALOXONE HCL 0.4 MG/ML IJ SOLN: 0.4000 mg | INTRAMUSCULAR | Status: DC | PRN

## 2011-11-10 MED ORDER — VASOPRESSIN 20 UNIT/ML IJ SOLN
INTRAMUSCULAR | Status: AC
  Filled 2011-11-10: qty 1

## 2011-11-10 MED ORDER — DEXAMETHASONE SODIUM PHOSPHATE 10 MG/ML IJ SOLN
INTRAMUSCULAR | Status: AC
  Filled 2011-11-10: qty 1

## 2011-11-10 MED ORDER — OXYCODONE-ACETAMINOPHEN 5-325 MG PO TABS
1.0000 | ORAL_TABLET | ORAL | Status: DC | PRN
  Administered 2011-11-11: 1 via ORAL
  Filled 2011-11-10: qty 1

## 2011-11-10 MED ORDER — NEOSTIGMINE METHYLSULFATE 1 MG/ML IJ SOLN
INTRAMUSCULAR | Status: DC | PRN
  Administered 2011-11-10: 1 mg via INTRAVENOUS

## 2011-11-10 MED ORDER — MENTHOL 3 MG MT LOZG: 1.0000 | LOZENGE | OROMUCOSAL | Status: DC | PRN

## 2011-11-10 MED ORDER — SODIUM CHLORIDE 0.9 % IJ SOLN
INTRAMUSCULAR | Status: DC | PRN
  Administered 2011-11-10: 50 mL

## 2011-11-10 MED ORDER — METOCLOPRAMIDE HCL 5 MG/ML IJ SOLN
INTRAMUSCULAR | Status: AC
  Administered 2011-11-10: 10 mg via INTRAVENOUS
  Filled 2011-11-10: qty 2

## 2011-11-10 MED ORDER — ONDANSETRON HCL 4 MG/2ML IJ SOLN
4.0000 mg | Freq: Four times a day (QID) | INTRAMUSCULAR | Status: DC | PRN
  Administered 2011-11-10 (×2): 4 mg via INTRAVENOUS
  Filled 2011-11-10: qty 2

## 2011-11-10 MED ORDER — HYDROMORPHONE 0.3 MG/ML IV SOLN
INTRAVENOUS | Status: AC
  Filled 2011-11-10: qty 25

## 2011-11-10 MED ORDER — IBUPROFEN 600 MG PO TABS
600.0000 mg | ORAL_TABLET | Freq: Four times a day (QID) | ORAL | Status: DC | PRN
  Administered 2011-11-11: 600 mg via ORAL
  Filled 2011-11-10: qty 1

## 2011-11-10 MED ORDER — MIDAZOLAM HCL 2 MG/2ML IJ SOLN
INTRAMUSCULAR | Status: AC
  Filled 2011-11-10: qty 2

## 2011-11-10 MED ORDER — HYDROMORPHONE 0.3 MG/ML IV SOLN
INTRAVENOUS | Status: DC
  Administered 2011-11-10: 1.15 mg via INTRAVENOUS
  Administered 2011-11-10: 4 mL via INTRAVENOUS
  Administered 2011-11-10: 15:00:00 via INTRAVENOUS
  Administered 2011-11-11: 1 mg via INTRAVENOUS

## 2011-11-10 MED ORDER — HYDROMORPHONE HCL PF 1 MG/ML IJ SOLN
INTRAMUSCULAR | Status: DC | PRN
  Administered 2011-11-10 (×2): 0.5 mg via INTRAVENOUS
  Administered 2011-11-10: 1 mg via INTRAVENOUS

## 2011-11-10 MED ORDER — GLYCOPYRROLATE 0.2 MG/ML IJ SOLN
INTRAMUSCULAR | Status: AC
  Filled 2011-11-10: qty 2

## 2011-11-10 MED ORDER — ONDANSETRON HCL 4 MG/2ML IJ SOLN
INTRAMUSCULAR | Status: DC | PRN
  Administered 2011-11-10: 4 mg via INTRAVENOUS

## 2011-11-10 MED ORDER — ROCURONIUM BROMIDE 50 MG/5ML IV SOLN
INTRAVENOUS | Status: AC
  Filled 2011-11-10: qty 1

## 2011-11-10 MED ORDER — LACTATED RINGERS IV SOLN
INTRAVENOUS | Status: DC
  Administered 2011-11-10: 125 mL/h via INTRAVENOUS
  Administered 2011-11-10 – 2011-11-11 (×2): via INTRAVENOUS

## 2011-11-10 MED ORDER — HYDROMORPHONE HCL PF 1 MG/ML IJ SOLN
0.2500 mg | INTRAMUSCULAR | Status: DC | PRN
  Administered 2011-11-10 (×4): 0.5 mg via INTRAVENOUS

## 2011-11-10 MED ORDER — LACTATED RINGERS IV SOLN
INTRAVENOUS | Status: DC
  Administered 2011-11-10 (×3): via INTRAVENOUS

## 2011-11-10 MED ORDER — DEXAMETHASONE SODIUM PHOSPHATE 4 MG/ML IJ SOLN
INTRAMUSCULAR | Status: DC | PRN
  Administered 2011-11-10: 10 mg via INTRAVENOUS

## 2011-11-10 MED ORDER — NEOSTIGMINE METHYLSULFATE 1 MG/ML IJ SOLN
INTRAMUSCULAR | Status: AC
  Filled 2011-11-10: qty 10

## 2011-11-10 SURGICAL SUPPLY — 39 items
CANISTER SUCTION 2500CC (MISCELLANEOUS) ×3 IMPLANT
CLOTH BEACON ORANGE TIMEOUT ST (SAFETY) ×3 IMPLANT
CONT PATH 16OZ SNAP LID 3702 (MISCELLANEOUS) IMPLANT
DECANTER SPIKE VIAL GLASS SM (MISCELLANEOUS) IMPLANT
DRAPE CAMERA CLOSED 9X96 (DRAPES) ×3 IMPLANT
DRAPE HYSTEROSCOPY (DRAPE) IMPLANT
DRAPE PROXIMA HALF (DRAPES) ×3 IMPLANT
DRAPE STERI URO 9X17 APER PCH (DRAPES) ×3 IMPLANT
GLOVE BIO SURGEON STRL SZ7.5 (GLOVE) ×6 IMPLANT
GLOVE BIOGEL PI IND STRL 6.5 (GLOVE) ×2 IMPLANT
GLOVE BIOGEL PI IND STRL 7.0 (GLOVE) IMPLANT
GLOVE BIOGEL PI IND STRL 7.5 (GLOVE) ×4 IMPLANT
GLOVE BIOGEL PI INDICATOR 6.5 (GLOVE) ×1
GLOVE BIOGEL PI INDICATOR 7.0 (GLOVE) ×1
GLOVE BIOGEL PI INDICATOR 7.5 (GLOVE) ×2
GLOVE INDICATOR 7.0 STRL GRN (GLOVE) ×2 IMPLANT
GLOVE NEODERM STER SZ 7 (GLOVE) ×2 IMPLANT
GLOVE SURG SS PI 7.0 STRL IVOR (GLOVE) ×1 IMPLANT
GOWN PREVENTION PLUS LG XLONG (DISPOSABLE) ×8 IMPLANT
GOWN STRL REIN XL XLG (GOWN DISPOSABLE) ×4 IMPLANT
GOWN SURGICAL LARGE (GOWNS) ×1 IMPLANT
NDL SPNL 22GX3.5 QUINCKE BK (NEEDLE) IMPLANT
NEEDLE HYPO 22GX1.5 SAFETY (NEEDLE) IMPLANT
NEEDLE MAYO .5 CIRCLE (NEEDLE) IMPLANT
NEEDLE SPNL 22GX3.5 QUINCKE BK (NEEDLE) IMPLANT
NS IRRIG 1000ML POUR BTL (IV SOLUTION) ×3 IMPLANT
PACK VAGINAL WOMENS (CUSTOM PROCEDURE TRAY) ×3 IMPLANT
SET CYSTO W/LG BORE CLAMP LF (SET/KITS/TRAYS/PACK) ×3 IMPLANT
SUT VIC AB 0 CT1 18XCR BRD8 (SUTURE) ×6 IMPLANT
SUT VIC AB 0 CT1 27 (SUTURE)
SUT VIC AB 0 CT1 27XBRD ANBCTR (SUTURE) IMPLANT
SUT VIC AB 0 CT1 8-18 (SUTURE) ×9
SUT VIC AB 2-0 SH 27 (SUTURE) ×9
SUT VIC AB 2-0 SH 27XBRD (SUTURE) ×2 IMPLANT
SUT VICRYL 0 TIES 12 18 (SUTURE) ×3 IMPLANT
SYR TB 1ML 25GX5/8 (SYRINGE) ×3 IMPLANT
TOWEL OR 17X24 6PK STRL BLUE (TOWEL DISPOSABLE) ×6 IMPLANT
TRAY FOLEY CATH 14FR (SET/KITS/TRAYS/PACK) ×3 IMPLANT
WATER STERILE IRR 1000ML POUR (IV SOLUTION) ×3 IMPLANT

## 2011-11-10 NOTE — Anesthesia Preprocedure Evaluation (Addendum)
Anesthesia Evaluation  Patient identified by MRN, date of birth, ID band Patient awake    Reviewed: Allergy & Precautions, H&P , NPO status , Patient's Chart, lab work & pertinent test results, reviewed documented beta blocker date and time   History of Anesthesia Complications Negative for: history of anesthetic complications  Airway Mallampati: II TM Distance: >3 FB Neck ROM: full    Dental  (+) Teeth Intact and Caps   Pulmonary asthma (last inhaler use 5/12, seasonal) , Recent URI  (recent cough, sinus drainage, no fever),  clear to auscultation  Pulmonary exam normal       Cardiovascular Exercise Tolerance: Good neg cardio ROS regular Normal    Neuro/Psych Low back pain x4-5 years Negative Neurological ROS  Negative Psych ROS   GI/Hepatic Neg liver ROS, GERD-  Medicated and Controlled,  Endo/Other  Negative Endocrine ROS  Renal/GU negative Renal ROS  Genitourinary negative   Musculoskeletal   Abdominal   Peds  Hematology negative hematology ROS (+)   Anesthesia Other Findings   Reproductive/Obstetrics negative OB ROS                          Anesthesia Physical Anesthesia Plan  ASA: II  Anesthesia Plan: General ETT   Post-op Pain Management:    Induction:   Airway Management Planned:   Additional Equipment:   Intra-op Plan:   Post-operative Plan:   Informed Consent: I have reviewed the patients History and Physical, chart, labs and discussed the procedure including the risks, benefits and alternatives for the proposed anesthesia with the patient or authorized representative who has indicated his/her understanding and acceptance.   Dental Advisory Given  Plan Discussed with: CRNA and Surgeon  Anesthesia Plan Comments:         Anesthesia Quick Evaluation

## 2011-11-10 NOTE — Addendum Note (Signed)
Addendum  created 11/10/11 1728 by Karleen Dolphin, CRNA   Modules edited:Notes Section

## 2011-11-10 NOTE — Anesthesia Postprocedure Evaluation (Signed)
Anesthesia Post Note  Patient: Nancy Robertson  Procedure(s) Performed:  HYSTERECTOMY VAGINAL - Total Vaginal Hysterectomy, bilateral salpingo-oopherectomy, cystoscopy; SALPINGO OOPHERECTOMY; CYSTOSCOPY  Anesthesia type: GA  Patient location: PACU  Post pain: Pain level controlled  Post assessment: Post-op Vital signs reviewed  Last Vitals:  Filed Vitals:   11/10/11 1300  BP: 126/81  Pulse: 63  Temp: 36.9 C  Resp: 16    Post vital signs: Reviewed  Level of consciousness: sedated  Complications: No apparent anesthesia complications

## 2011-11-10 NOTE — Progress Notes (Signed)
Day of Surgery Procedure(s): HYSTERECTOMY VAGINAL SALPINGO OOPHERECTOMY CYSTOSCOPY  Subjective: Patient reports nausea and vomiting earlier.  She says she feels better after she walked.  Small amount of spotting on pad.    Objective: I have reviewed patient's vital signs. UOP clear.  General: alert and no distress Resp: clear to auscultation bilaterally Cardio: regular rate and rhythm GI: soft, non-tender; bowel sounds normal; no masses,  no organomegaly Extremities: Homans sign is negative, no sign of DVT and scds are on. Vaginal Bleeding: minimal small spots on pad after walking.  Assessment: s/p Procedure(s): HYSTERECTOMY VAGINAL SALPINGO OOPHERECTOMY CYSTOSCOPY: stable  Plan: Encourage ambulation Clear liquids and npo if any further n/v. Continue SCDs Labs in am Encourage IS Good UOP  LOS: 0 days    Nancy Robertson Y 11/10/2011, 7:15 PM

## 2011-11-10 NOTE — Op Note (Addendum)
Preop Diagnosis: Fibroids, Menorrhagia   Postop Diagnosis: Fibroids, Menorrhagia   Procedure: 1. HYSTERECTOMY VAGINAL 2.CYSTOSCOPY  Anesthesia: General   Anesthesiologist: Chelsye L. Rodman Pickle, MD   Attending: Purcell Nails, MD   Assistant: Henreitta Leber, PA  Findings: Normal appearing bilateral ovaries and tubes  Pathology: Uterus, cervix, bilateral ovaries and tubes.  Wt 160 g  Fluids: 2000cc  UOP: 900cc  EBL: 175cc  Complications: None  Procedure: The patient was taken to the operating room after the risks, benefits and alternatives were discussed with the patient. The patient verbalized understanding and consent signed and witnessed. The patient was placed under general anesthesia per the anesthesiologist and a timeout was performed per protocol. The patient was prepped and draped in the normal sterile fashion in the dorsal lithotomy position. A weighted speculum was placed the patient's vagina and the anterior lip of the cervix was grasped single-tooth tenaculum. Dever retractors were placed for vaginal wall retraction. The cervix was circumscribed with the bovie after injecting the cervix with pitressin at a concentration of 20 units of Pitressin in 50 cc of normal saline.  A total of 10 cc was injected.  Once the cervix was circumscribed the posterior cul-de-sac was entered without difficulty with the mayo scissors.  Attention was then turned to the anterior cul-de-sac which was entered without difficulty as well.  Heaney clamps were used to clamp the uterosacral and cardinal ligaments and the tissue was then cut and suture ligated using 0 Vicryl. This was done bilaterally. The remaining parametrial tissue was clamped, cut and suture ligated using 0 Vicryl in a sequential and bilateral fashion up to the utero-ovarian ligaments. The fundus was exteriorized and the utero-ovarian pedicle on the patient's right was clamped with a Kelly cut and ligated with 0 Vicryl and suture  ligated with 0 Vicryl as well. The same was done on the contralateral side. The uterus was removed and handed off to be sent to pathology. The left ovary was identified and grasped with the Babcock as well as the fallopian tube on the ipsilateral side. The infundibulopelvic was clamped with a Kelly clamp and the ovary and fallopian tube were excised and the remaining pedicle was ligated with 0 Vicryl and suture-ligated with 0 Vicryl as well. The same was done on the contralateral side.  There some oozing of the right pedicle which was made hemostatic with 2-0 and 3-0 vicryl.  The bilateral ovaries and fallopian tubes appeared to be within normal limits. The angles of the cuff were sutured using the free needle and the already existent suture on the uterosacral ligament. This was done bilaterally.  The bilateral pedicles at the infundibulopelvic ligaments were noted to be hemostatic. The cuff was then repaired to the midline with figure-of-eight and interrupted stitches of 0 Vicryl.  The cuff was noted to be hemostatic.  The Foley was removed and cystoscopy was performed after administration of indigo carmine. Bilateral ureters were noted to eflux without difficulty and there were no inadvertent bladder injuries. The Foley was replaced to gravity.  The patient tolerated the procedure well and was returned to the recovery room in good condition.

## 2011-11-10 NOTE — Transfer of Care (Signed)
Immediate Anesthesia Transfer of Care Note  Patient: Nancy Robertson  Procedure(s) Performed:  HYSTERECTOMY VAGINAL - Total Vaginal Hysterectomy, bilateral salpingo-oopherectomy, cystoscopy; SALPINGO OOPHERECTOMY; CYSTOSCOPY  Patient Location: PACU  Anesthesia Type: General  Level of Consciousness: awake, alert  and oriented  Airway & Oxygen Therapy: Patient Spontanous Breathing and Patient connected to nasal cannula oxygen  Post-op Assessment: Report given to PACU RN and Post -op Vital signs reviewed and stable  Post vital signs: Reviewed and stable  Complications: No apparent anesthesia complications

## 2011-11-10 NOTE — Anesthesia Postprocedure Evaluation (Signed)
  Anesthesia Post-op Note  Patient: Nancy Robertson  Procedure(s) Performed:  HYSTERECTOMY VAGINAL - Total Vaginal Hysterectomy, bilateral salpingo-oopherectomy, cystoscopy; SALPINGO OOPHERECTOMY; CYSTOSCOPY  Patient Location: 306  Anesthesia Type: General  Level of Consciousness: awake, alert  and oriented  Airway and Oxygen Therapy: Patient Spontanous Breathing and Patient connected to nasal cannula oxygen  Post-op Pain: moderate, the patient has a PCA but is hesitant to use it because she does not like the "drugged" feeling & is afraid of becoming nauseated again. This was communicated to her assigned RN.  Post-op Assessment: Post-op Vital signs reviewed and Patient's Cardiovascular Status Stable  Post-op Vital Signs: Reviewed and stable  Complications: No apparent anesthesia complications

## 2011-11-11 ENCOUNTER — Encounter (HOSPITAL_COMMUNITY): Payer: Self-pay | Admitting: Obstetrics and Gynecology

## 2011-11-11 LAB — CBC
HCT: 37.8 % (ref 36.0–46.0)
Hemoglobin: 12.7 g/dL (ref 12.0–15.0)
MCH: 30 pg (ref 26.0–34.0)
MCHC: 33.6 g/dL (ref 30.0–36.0)
MCV: 89.4 fL (ref 78.0–100.0)
Platelets: 229 10*3/uL (ref 150–400)
RBC: 4.23 MIL/uL (ref 3.87–5.11)
RDW: 12.9 % (ref 11.5–15.5)
WBC: 10.4 10*3/uL (ref 4.0–10.5)

## 2011-11-11 MED ORDER — IBUPROFEN 600 MG PO TABS: 600.0000 mg | ORAL_TABLET | Freq: Four times a day (QID) | ORAL | Status: AC | PRN

## 2011-11-11 MED ORDER — OXYCODONE-ACETAMINOPHEN 5-325 MG PO TABS: 1.0000 | ORAL_TABLET | ORAL | Status: AC | PRN

## 2011-11-11 MED ORDER — ESTRADIOL 0.0375 MG/24HR TD PTWK
0.0375 mg | MEDICATED_PATCH | TRANSDERMAL | Status: DC
  Filled 2011-11-11: qty 1

## 2011-11-11 MED ORDER — ESTRADIOL 0.0375 MG/24HR TD PTWK: 0.0375 | MEDICATED_PATCH | TRANSDERMAL | Status: DC

## 2011-11-11 NOTE — Progress Notes (Signed)
Nancy Robertson is a89 y.o.  [id number]  Post Op Date # 1  Subjective: Patient is Doing well postoperatively. Patient has minimal pain, hasn't used PCA much as she doesn't like the way it makes her feel.  States that the heating pad is adequate and will take Ibuprofen once she eats. Diet:Liquids and crackers with peanut butter Patient has voided without difficulty and passed flatus  Activity: Ambulating in hall without difficulty  Objective: Vital signs in last 24 hours: Temp:  [97.4 F (36.3 C)-99.2 F (37.3 C)] 98.6 F (37 C) (02/01 0545) Pulse Rate:  [55-87] 76  (02/01 0545) Resp:  [16-20] 18  (02/01 0552) BP: (117-137)/(55-81) 124/73 mmHg (02/01 0545) SpO2:  [94 %-100 %] 98 % (02/01 0552) Weight:  [85.73 kg (189 lb)] 85.73 kg (189 lb) (01/31 1423)  Intake/Output from previous day: 01/31 0701 - 02/01 0700 In: 2700 [I.V.:2700] Out: 4850 [Urine:4675] Intake/Output this shift:    Lab 11/11/11 0543  WBC 10.4  HGB 12.7  HCT 37.8  PLT 229    No results found for this basename: NA:3,K:3,CL:3,CO2:3,BUN:3,CREATININE:3,CALCIUM:3,LABALBU:3,PROT:3,BILITOT:3,ALKPHOS:3,ALT:3,AST:3,GLUCOSE:3 in the last 168 hours  EXAM: General: alert and no distress Resp: clear to auscultation bilaterally Cardio: regular rate and rhythm, S1, S2 normal, no murmur, click, rub or gallop GI: soft, bowel sounds present, appropriately tender Extremities: Homans sign is negative, no sign of DVT and SCD hose in place and functioning Vaginal Bleeding: scant and dry stain   Assessment: s/p Procedure(s): HYSTERECTOMY VAGINAL SALPINGO OOPHERECTOMY CYSTOSCOPY: progressing well  Plan: Advance diet D/C PCA & IV once patient has tolerated breakfast  Begin estradiol therapy.  Discussion was held with patient about hormone therapy to include a review of risks & benefits as outlined in the [facility] Health Initiative Study for HRT & ERT  Probable discharge home today  LOS: 1 day    POWELL,ELMIRA, PA-C     11/11/2011 7:06 AM   Agree with above.  I explained the possible risks assoc with starting HRT immediately postop and pt verbalized understanding and may wait a few days before starting.  She is voiding without difficulty, tolerating po, ambulating without difficulty and pain is well controlled.  Patient was using heating pad when temp was 99.  Pt is ready for discharge this afternoon.

## 2011-11-11 NOTE — Discharge Summary (Signed)
Physician Discharge Summary  Patient ID: Nancy Robertson MRN: [id number] DOB/AGE: 47-20-1967 47 y.o.  Admit date: 11/10/2011 Discharge date: 11/11/2011  Admission Diagnoses: menorrhagia and fibroid  Discharge Diagnoses: menorrhagia and fibroid Active Problems:  * No active hospital problems. *    Discharged Condition: good  Hospital Course: s/p TVH/BSO doing well.  Routine postop course.  Consults: None  Significant Diagnostic Studies: n/a  Treatments: surgery: TVH/BSO  Discharge Exam: Blood pressure 125/75, pulse 74, temperature 99.4 F (37.4 C), temperature source Oral, resp. rate 18, height 5\' 6"  (1.676 m), weight 85.73 kg (189 lb), last menstrual period 11/04/2011, SpO2 95.00%. exam wnl at discharge documented in progress note  Disposition: Home or Self Care   Medication List  As of 11/11/2011 11:46 AM   TAKE these medications         acetaminophen 500 MG tablet   Commonly known as: TYLENOL   Take 1,000 mg by mouth as needed. For headache or pain.      albuterol 108 (90 BASE) MCG/ACT inhaler   Commonly known as: PROVENTIL HFA;VENTOLIN HFA   Inhale 2 puffs into the lungs every 6 (six) hours as needed. For shortness of breath/wheezing        celecoxib 200 MG capsule   Commonly known as: CELEBREX   Take 200 mg by mouth as needed. For Lower Back Pain.      DHA OMEGA 3 PO   Take 900 mg by mouth daily.      estradiol 0.0375 mg/24hr   Commonly known as: CLIMARA - Dosed in mg/24 hr   Place 1 patch (0.0375 mg total) onto the skin once a week.      ibuprofen 600 MG tablet   Commonly known as: ADVIL,MOTRIN   Take 1 tablet (600 mg total) by mouth every 6 (six) hours as needed for pain.      oxyCODONE-acetaminophen 5-325 MG per tablet   Commonly known as: PERCOCET   Take 1 tablet by mouth every 4 (four) hours as needed for pain.      pantoprazole 40 MG tablet   Commonly known as: PROTONIX   Take 40 mg by mouth daily.      tolterodine 4 MG 24 hr capsule   Commonly  known as: DETROL LA   Take 4 mg by mouth daily.           Follow-up Information    Follow up with Purcell Nails, MD on 11/28/2011. (as scheduled)    Contact information:   [address] [place] [address] [phone]          Signed: Purcell Nails 11/11/2011, 11:46 AM

## 2011-12-21 ENCOUNTER — Encounter (INDEPENDENT_AMBULATORY_CARE_PROVIDER_SITE_OTHER): Payer: BC Managed Care – PPO | Admitting: Obstetrics and Gynecology

## 2011-12-21 DIAGNOSIS — N920 Excessive and frequent menstruation with regular cycle: Secondary | ICD-10-CM

## 2011-12-21 DIAGNOSIS — D259 Leiomyoma of uterus, unspecified: Secondary | ICD-10-CM

## 2012-01-23 NOTE — Telephone Encounter (Signed)
This encounter was created in error - please disregard.

## 2012-02-29 ENCOUNTER — Other Ambulatory Visit: Payer: Self-pay | Admitting: Obstetrics and Gynecology

## 2012-02-29 NOTE — Telephone Encounter (Signed)
Can pt have refill on rx 

## 2012-03-01 NOTE — Telephone Encounter (Signed)
Yes, thank you.

## 2012-03-08 ENCOUNTER — Encounter (HOSPITAL_COMMUNITY): Payer: Self-pay | Admitting: *Deleted

## 2012-03-08 ENCOUNTER — Emergency Department (HOSPITAL_COMMUNITY): Payer: BC Managed Care – PPO

## 2012-03-08 ENCOUNTER — Emergency Department (HOSPITAL_COMMUNITY)
Admission: EM | Admit: 2012-03-08 | Discharge: 2012-03-08 | Disposition: A | Discharge status: Home / Self Care | Payer: BC Managed Care – PPO | Attending: Emergency Medicine | Admitting: Emergency Medicine

## 2012-03-08 DIAGNOSIS — K219 Gastro-esophageal reflux disease without esophagitis: Secondary | ICD-10-CM | POA: Insufficient documentation

## 2012-03-08 DIAGNOSIS — J45909 Unspecified asthma, uncomplicated: Secondary | ICD-10-CM | POA: Insufficient documentation

## 2012-03-08 DIAGNOSIS — R079 Chest pain, unspecified: Secondary | ICD-10-CM | POA: Insufficient documentation

## 2012-03-08 LAB — COMPREHENSIVE METABOLIC PANEL
ALT: 16 U/L (ref 0–35)
AST: 17 U/L (ref 0–37)
Albumin: 4.3 g/dL (ref 3.5–5.2)
Alkaline Phosphatase: 49 U/L (ref 39–117)
BUN: 16 mg/dL (ref 6–23)
CO2: 30 mEq/L (ref 19–32)
Calcium: 10.3 mg/dL (ref 8.4–10.5)
Chloride: 98 mEq/L (ref 96–112)
Creatinine, Ser: 0.68 mg/dL (ref 0.50–1.10)
GFR calc Af Amer: >90 mL/min (ref >90)
GFR calc non Af Amer: >90 mL/min (ref >90)
Glucose, Bld: 102 mg/dL — ABNORMAL HIGH (ref 70–99)
Potassium: 4 mEq/L (ref 3.5–5.1)
Sodium: 138 mEq/L (ref 135–145)
Total Bilirubin: 0.4 mg/dL (ref 0.3–1.2)
Total Protein: 7.5 g/dL (ref 6.0–8.3)

## 2012-03-08 LAB — DIFFERENTIAL
Basophils Absolute: 0 10*3/uL (ref 0.0–0.1)
Basophils Relative: 0 % (ref 0–1)
Eosinophils Absolute: 0.1 10*3/uL (ref 0.0–0.7)
Eosinophils Relative: 2 % (ref 0–5)
Lymphocytes Relative: 45 % (ref 12–46)
Lymphs Abs: 2.6 10*3/uL (ref 0.7–4.0)
Monocytes Absolute: 0.3 10*3/uL (ref 0.1–1.0)
Monocytes Relative: 5 % (ref 3–12)
Neutro Abs: 2.8 10*3/uL (ref 1.7–7.7)
Neutrophils Relative %: 48 % (ref 43–77)

## 2012-03-08 LAB — CBC
HCT: 39.4 % (ref 36.0–46.0)
Hemoglobin: 13.7 g/dL (ref 12.0–15.0)
MCH: 30.3 pg (ref 26.0–34.0)
MCHC: 34.8 g/dL (ref 30.0–36.0)
MCV: 87.2 fL (ref 78.0–100.0)
Platelets: 175 10*3/uL (ref 150–400)
RBC: 4.52 MIL/uL (ref 3.87–5.11)
RDW: 12.6 % (ref 11.5–15.5)
WBC: 5.7 10*3/uL (ref 4.0–10.5)

## 2012-03-08 LAB — TROPONIN I: Troponin I: <0.3 ng/mL (ref <0.30)

## 2012-03-08 NOTE — ED Provider Notes (Signed)
History     CSN: [id number]  Arrival date & time 03/08/12  1544   First MD Initiated Contact with Patient 03/08/12 1558      Chief Complaint  Patient presents with  . Chest Pain    (Consider location/radiation/quality/duration/timing/severity/associated sxs/prior treatment) HPI Comments: For the past month has had episodic tightness in chest into back.  Went to Dr. Fletcher Anon office this afternoon and was sent here.  Patient is a 46 y.o. female presenting with chest pain. The history is provided by the patient.  Chest Pain Episode onset: one month ago. Duration of episode(s) is 30 minutes. Chest pain occurs intermittently. The chest pain is resolved. The severity of the pain is mild. The quality of the pain is described as tightness. The pain does not radiate. Pertinent negatives for primary symptoms include no fever, no shortness of breath, no cough, no palpitations, no nausea and no vomiting.     Past Medical History  Diagnosis Date  . GERD (gastroesophageal reflux disease)   . Asthma     seasonal only    Past Surgical History  Procedure Date  . Knee surgery 2007    right  . Bladder repair 2010  . Vaginal hysterectomy 11/10/2011    Procedure: HYSTERECTOMY VAGINAL;  Surgeon: Purcell Nails, MD;  Location: [facility];  Service: Gynecology;  Laterality: N/A;  Total Vaginal Hysterectomy, bilateral salpingo-oopherectomy, cystoscopy  . Salpingoophorectomy 11/10/2011    Procedure: SALPINGO OOPHERECTOMY;  Surgeon: Purcell Nails, MD;  Location: [facility];  Service: Gynecology;  Laterality: Bilateral;  . Cystoscopy 11/10/2011    Procedure: CYSTOSCOPY;  Surgeon: Purcell Nails, MD;  Location: [facility];  Service: Gynecology;  Laterality: N/A;    History reviewed. No pertinent family history.  History  Substance Use Topics  . Smoking status: Never Smoker   . Smokeless tobacco: Not on file  . Alcohol Use: Yes     occasionally wine     OB History    Grav Para Term Preterm Abortions  TAB SAB Ect Mult Living   2 2              Review of Systems  Constitutional: Negative for fever.  Respiratory: Negative for cough and shortness of breath.   Cardiovascular: Positive for chest pain. Negative for palpitations.  Gastrointestinal: Negative for nausea and vomiting.  All other systems reviewed and are negative.    Allergies  Azithromycin; Erythromycin; and Penicillins  Home Medications   Current Outpatient Rx  Name Route Sig Dispense Refill  . ACETAMINOPHEN 500 MG PO TABS Oral Take 1,000 mg by mouth as needed. For headache or pain.    . ALBUTEROL SULFATE HFA 108 (90 BASE) MCG/ACT IN AERS Inhalation Inhale 2 puffs into the lungs every 6 (six) hours as needed. For shortness of breath/wheezing     . CELECOXIB 200 MG PO CAPS Oral Take 200 mg by mouth as needed. For Lower Back Pain.    Marland Kitchen DETROL LA 4 MG PO CP24  TAKE 1 CAPSULE BY MOUTH EVERY DAY 30 capsule 0  . DHA OMEGA 3 PO Oral Take 900 mg by mouth daily.    Marland Kitchen ESTRADIOL 0.0375 MG/24HR TD PTWK Transdermal Place 1 patch (0.0375 mg total) onto the skin once a week. 4 patch 0  . PANTOPRAZOLE SODIUM 40 MG PO TBEC Oral Take 40 mg by mouth daily.      BP 121/56  Pulse 71  Temp(Src) 97.7 F (36.5 C) (Oral)  Resp 20  Ht 5'  7" (1.702 m)  Wt 182 lb (82.555 kg)  BMI 28.51 kg/m2  SpO2 97%  LMP 10/13/2011  Physical Exam  Nursing note and vitals reviewed. Constitutional: She is oriented to person, place, and time. She appears well-developed and well-nourished. No distress.  HENT:  Head: Normocephalic and atraumatic.  Neck: Normal range of motion. Neck supple.  Cardiovascular: Normal rate and regular rhythm.  Exam reveals no gallop and no friction rub.   No murmur heard. Pulmonary/Chest: Effort normal and breath sounds normal. No respiratory distress. She has no wheezes.  Abdominal: Soft. Bowel sounds are normal. She exhibits no distension. There is no tenderness.  Musculoskeletal: Normal range of motion.  Neurological:  She is alert and oriented to person, place, and time.  Skin: Skin is warm and dry. She is not diaphoretic.    ED Course  Procedures (including critical care time)   Labs Reviewed  CBC  DIFFERENTIAL  COMPREHENSIVE METABOLIC PANEL  TROPONIN I   No results found.   No diagnosis found.   Date: 03/08/2012  Rate: 66  Rhythm: normal sinus rhythm  QRS Axis: normal  Intervals: normal  ST/T Wave abnormalities: normal  Conduction Disutrbances:none  Narrative Interpretation:   Old EKG Reviewed: none available    MDM  The labs and ekg are okay.  She is symptom-free and I believe stable for follow up with pcp to arrange outpatient stress testing.  To return prn if worsens.        Geoffery Lyons, MD 03/08/12 [phone]

## 2012-03-08 NOTE — ED Notes (Signed)
Pt c/o pressure in her central chest off and on x 1 month. Pt states that it started at 1130 and has stayed. States that sometimes it radiates to her back. Pt also c/o tightening in her jaws. Blood pressure was up at Dr. Cathlyn Parsons office and pt told to come to ED.

## 2012-03-13 ENCOUNTER — Telehealth: Payer: Self-pay

## 2012-03-13 NOTE — Telephone Encounter (Signed)
Called pt's [facility] pharmacy to authorize a 90 day supply of Detrol LA 4 mg tabs sig: 1 PO QD. No addt'l RF's at this time per AR. Alvino Chapel, CMA

## 2012-04-13 ENCOUNTER — Encounter: Payer: Self-pay | Admitting: Cardiovascular Disease

## 2012-04-13 ENCOUNTER — Ambulatory Visit: Payer: BC Managed Care – PPO | Admitting: Cardiology

## 2012-04-16 ENCOUNTER — Other Ambulatory Visit: Payer: Self-pay | Admitting: Obstetrics and Gynecology

## 2012-04-16 NOTE — Telephone Encounter (Signed)
[place] can this pt this rx. You did give pt this rx in 2/13.

## 2012-04-16 NOTE — Telephone Encounter (Signed)
LM FOR PT TO CALL BACK. 

## 2012-04-16 NOTE — Telephone Encounter (Signed)
Patient is S/P TVH/BSO in January 2013 requesting a refill on her Minivelle 0.375.  She may have a prescription with refills x 1 year.  Cordaro Mukai \, PA-C

## 2012-04-17 ENCOUNTER — Ambulatory Visit (INDEPENDENT_AMBULATORY_CARE_PROVIDER_SITE_OTHER): Payer: BC Managed Care – PPO | Admitting: Cardiovascular Disease

## 2012-04-17 ENCOUNTER — Telehealth: Payer: Self-pay

## 2012-04-17 ENCOUNTER — Other Ambulatory Visit: Payer: Self-pay | Admitting: Cardiovascular Disease

## 2012-04-17 ENCOUNTER — Encounter: Payer: Self-pay | Admitting: Cardiovascular Disease

## 2012-04-17 VITALS — BP 121/77 | HR 64 | Ht 66.0 in | Wt 185.4 lb

## 2012-04-17 DIAGNOSIS — R002 Palpitations: Secondary | ICD-10-CM

## 2012-04-17 DIAGNOSIS — IMO0001 Reserved for inherently not codable concepts without codable children: Secondary | ICD-10-CM | POA: Insufficient documentation

## 2012-04-17 DIAGNOSIS — R079 Chest pain, unspecified: Secondary | ICD-10-CM

## 2012-04-17 DIAGNOSIS — S0300XA Dislocation of jaw, unspecified side, initial encounter: Secondary | ICD-10-CM | POA: Insufficient documentation

## 2012-04-17 DIAGNOSIS — E782 Mixed hyperlipidemia: Secondary | ICD-10-CM | POA: Insufficient documentation

## 2012-04-17 DIAGNOSIS — K219 Gastro-esophageal reflux disease without esophagitis: Secondary | ICD-10-CM

## 2012-04-17 DIAGNOSIS — Z136 Encounter for screening for cardiovascular disorders: Secondary | ICD-10-CM

## 2012-04-17 HISTORY — DX: Mixed hyperlipidemia: E78.2

## 2012-04-17 HISTORY — DX: Chest pain, unspecified: R07.9

## 2012-04-17 HISTORY — DX: Dislocation of jaw, unspecified side, initial encounter: S03.00XA

## 2012-04-17 HISTORY — DX: Palpitations: R00.2

## 2012-04-17 MED ORDER — ESTRADIOL 0.0375 MG/24HR TD PTTW: 1.0000 | MEDICATED_PATCH | TRANSDERMAL | Status: DC

## 2012-04-17 NOTE — Assessment & Plan Note (Signed)
Continue protonix and encouraged weight loss and low carb diet

## 2012-04-17 NOTE — Assessment & Plan Note (Signed)
Bengin no need for further w/u

## 2012-04-17 NOTE — Patient Instructions (Addendum)
**Note De-Identified Tynisha Ogan Obfuscation** Your physician has requested that you have an exercise tolerance test. For further information please visit [email or web address] Please also follow instruction sheet, as given.  Your physician recommends that you have a Calcium score. This procedure is done in [place] office, we will arrange  Your physician recommends that you schedule a follow-up appointment in: as needed, we will contact you with results of tests

## 2012-04-17 NOTE — Assessment & Plan Note (Signed)
She is hesitant to take statin but LDL very high and red yeast rice wont reduce it enough.  Will order calcium score and if 0 she may prefer to try life style modifications If high she will start statin

## 2012-04-17 NOTE — Addendum Note (Signed)
**Note De-Identified Kelvin Sennett Obfuscation** Addended by: Demetrios Loll on: 04/17/2012 02:08 PM   Modules accepted: Orders

## 2012-04-17 NOTE — Assessment & Plan Note (Signed)
Atypical  F/U ETT  

## 2012-04-17 NOTE — Progress Notes (Signed)
Patient ID: Nancy Robertson, female   DOB: [birth year], 46 y.o.   MRN: [id number] 46 yo patient referred by Dr Gerda Diss for chest pain.  Seen in his office 5/30. Pain in middle of chest through back.  Happening last 3-4 weeks.  Felt heart quivering with cramps and fatigue after.  Sent to ER R/O and no further F/U  No pain last week when on vacaton.  Has palpitations related to recent hormone replacement changes.  Thinks pains are related to stress but is very concerned about her family history of premature CAD.  She is sedentary Reviewed labs from [place] and recent LDL was 150  She has started red yeast rice and has F/U labs due August.  She has concerns about long term side effects of statins t ROS: Denies fever, malais, weight loss, blurry vision, decreased visual acuity, cough, sputum, SOB, hemoptysis, pleuritic pain, palpitaitons, heartburn, abdominal pain, melena, lower extremity edema, claudication, or rash.  All other systems reviewed and negative   General: Affect appropriate Healthy:  appears stated age HEENT: normal Neck supple with no adenopathy JVP normal no bruits no thyromegaly Lungs clear with no wheezing and good diaphragmatic motion Heart:  S1/S2 no murmur,rub, gallop or click PMI normal Abdomen: benighn, BS positve, no tenderness, no AAA no bruit.  No HSM or HJR Distal pulses intact with no bruits No edema Neuro non-focal Skin warm and dry No muscular weakness  Medications Current Outpatient Prescriptions  Medication Sig Dispense Refill  . acetaminophen (TYLENOL) 500 MG tablet Take 1,000 mg by mouth as needed. For headache or pain.      Marland Kitchen albuterol (PROVENTIL HFA;VENTOLIN HFA) 108 (90 BASE) MCG/ACT inhaler Inhale 2 puffs into the lungs every 6 (six) hours as needed. For shortness of breath/wheezing       . Biotin 5000 MCG TABS Take 10,000 mcg by mouth daily.      . celecoxib (CELEBREX) 200 MG capsule Take 200 mg by mouth as needed. For Lower Back Pain.      . Coenzyme Q10  200 MG capsule Take 400 mg by mouth every morning.      Marland Kitchen estradiol (MINIVELLE) 0.0375 MG/24HR Place 1 patch onto the skin 2 (two) times a week.  8 patch  11  . Misc Natural Products (PROGESTERONE EX) Apply 1 application topically daily.      . pantoprazole (PROTONIX) 40 MG tablet Take 40 mg by mouth every morning.       . RED YEAST RICE EXTRACT PO Take 2 tablets by mouth every morning.        Allergies Azithromycin; Erythromycin; and Penicillins  Family History: No family history on file.  Social History: History   Social History  . Marital Status: Married    Spouse Name: N/A    Number of Children: N/A  . Years of Education: N/A   Occupational History  . Not on file.   Social History Main Topics  . Smoking status: Never Smoker   . Smokeless tobacco: Not on file  . Alcohol Use: Yes     occasionally wine   . Drug Use: No  . Sexually Active: Yes    Birth Control/ Protection: None, Surgical   Other Topics Concern  . Not on file   Social History Narrative  . No narrative on file    Electrocardiogram:  SR rate 66 poor R wave progression othewise normal.  03/09/12  Assessment and Plan

## 2012-04-17 NOTE — Telephone Encounter (Signed)
DONE3

## 2012-04-20 ENCOUNTER — Other Ambulatory Visit: Payer: BC Managed Care – PPO

## 2012-05-02 ENCOUNTER — Encounter: Payer: Self-pay | Admitting: *Deleted

## 2012-05-03 ENCOUNTER — Encounter: Payer: Self-pay | Admitting: Nurse Practitioner

## 2012-05-03 ENCOUNTER — Ambulatory Visit (INDEPENDENT_AMBULATORY_CARE_PROVIDER_SITE_OTHER)
Admission: RE | Admit: 2012-05-03 | Discharge: 2012-05-03 | Disposition: A | Discharge status: Home / Self Care | Payer: Self-pay | Source: Ambulatory Visit | Attending: Cardiovascular Disease | Admitting: Cardiovascular Disease

## 2012-05-03 ENCOUNTER — Ambulatory Visit (INDEPENDENT_AMBULATORY_CARE_PROVIDER_SITE_OTHER): Payer: BC Managed Care – PPO | Admitting: Nurse Practitioner

## 2012-05-03 DIAGNOSIS — Z136 Encounter for screening for cardiovascular disorders: Secondary | ICD-10-CM

## 2012-05-03 DIAGNOSIS — R079 Chest pain, unspecified: Secondary | ICD-10-CM

## 2012-05-03 NOTE — Procedures (Signed)
Exercise Treadmill Test  Pre-Exercise Testing Evaluation Rhythm: normal sinus  Rate: 68   PR:  .18 QRS:  .08  QT:  .42 QTc: .45     Test  Exercise Tolerance Test Ordering MD: Charlton Haws, MD  Interpreting MD: Ward Givens , NP  Unique Test No: 1  Treadmill:  1  Indication for ETT: chest pain - rule out ischemia  Contraindication to ETT: No   Stress Modality: exercise - treadmill  Cardiac Imaging Performed: non   Protocol: standard Bruce - maximal  Max BP:  160/87  Max MPHR (bpm):  173 85% MPR (bpm):  147  MPHR obtained (bpm):  160 % MPHR obtained:  93%  Reached 85% MPHR (min:sec):  6:38 Total Exercise Time (min-sec):  9:00  Workload in METS:  10.1 Borg Scale: 17  Reason ETT Terminated:  dyspnea    ST Segment Analysis At Rest: normal ST segments - no evidence of significant ST depression With Exercise: no evidence of significant ST depression  Other Information Arrhythmia:  No Angina during ETT:  absent (0) Quality of ETT:  diagnostic  ETT Interpretation:  normal - no evidence of ischemia by ST analysis  Comments: Good exercise tolerance.  Minimal upsloping ST depression noted with exercise.  No chest pain.  Recommendations: F/U Dr. Eden Emms as scheduled.

## 2012-05-04 ENCOUNTER — Telehealth: Payer: Self-pay | Admitting: *Deleted

## 2012-05-04 NOTE — Telephone Encounter (Signed)
Advised patient and will forward to Dr Eden Emms

## 2012-05-04 NOTE — Telephone Encounter (Signed)
Message copied by Burnell Blanks on Fri May 04, 2012  3:52 PM ------      Message from: Cassell Clement      Created: Thu May 03, 2012  4:34 PM       Treadmill test was normal.  Please inform patient of results. Please forward to Dr. Eden Emms.

## 2012-05-09 ENCOUNTER — Telehealth: Payer: Self-pay | Admitting: Cardiovascular Disease

## 2012-05-09 NOTE — Telephone Encounter (Signed)
PT AWARE OF CA SCORE AND GXT RESULTS./CY

## 2012-05-09 NOTE — Telephone Encounter (Signed)
Fu call °Pt returning your call  °

## 2012-06-01 ENCOUNTER — Ambulatory Visit: Payer: BC Managed Care – PPO | Admitting: Cardiovascular Disease

## 2012-07-02 ENCOUNTER — Other Ambulatory Visit: Payer: Self-pay | Admitting: Obstetrics and Gynecology

## 2012-07-02 DIAGNOSIS — Z1231 Encounter for screening mammogram for malignant neoplasm of breast: Secondary | ICD-10-CM

## 2012-07-03 ENCOUNTER — Ambulatory Visit: Payer: BC Managed Care – PPO

## 2012-07-17 ENCOUNTER — Ambulatory Visit: Payer: BC Managed Care – PPO

## 2012-07-18 ENCOUNTER — Ambulatory Visit
Admission: RE | Admit: 2012-07-18 | Discharge: 2012-07-18 | Disposition: A | Discharge status: Home / Self Care | Payer: BC Managed Care – PPO | Source: Ambulatory Visit | Attending: Obstetrics and Gynecology | Admitting: Obstetrics and Gynecology

## 2012-07-18 DIAGNOSIS — Z1231 Encounter for screening mammogram for malignant neoplasm of breast: Secondary | ICD-10-CM

## 2012-07-24 ENCOUNTER — Encounter: Payer: Self-pay | Admitting: Obstetrics and Gynecology

## 2012-07-24 NOTE — Progress Notes (Signed)
Quick Note:  Please send "Dense breast" letter to patient and document in chart when letter is sent. ______ 

## 2012-08-28 ENCOUNTER — Telehealth: Payer: Self-pay | Admitting: Obstetrics and Gynecology

## 2012-08-28 NOTE — Telephone Encounter (Signed)
SR pt 

## 2012-08-28 NOTE — Telephone Encounter (Signed)
Returned pt call. Answered questions regarding dense breast letter. Scheduled pt Aex (requested AR) pt voiced understanding Sansa Alkema, Herbert Seta

## 2012-09-22 ENCOUNTER — Other Ambulatory Visit: Payer: Self-pay | Admitting: Obstetrics and Gynecology

## 2012-10-23 ENCOUNTER — Other Ambulatory Visit: Payer: Self-pay | Admitting: Obstetrics and Gynecology

## 2012-10-23 MED ORDER — ESTRADIOL 0.0375 MG/24HR TD PTTW: 1.0000 | MEDICATED_PATCH | TRANSDERMAL | Status: DC

## 2012-10-23 NOTE — Telephone Encounter (Signed)
SR pt 

## 2012-10-23 NOTE — Telephone Encounter (Signed)
Rx sent to [facility], pt aware and voiced understanding Pt has aex scheduled with AR on 11/09/12  Darien Ramus, CMA

## 2012-11-09 ENCOUNTER — Ambulatory Visit: Payer: BC Managed Care – PPO | Admitting: Obstetrics and Gynecology

## 2012-11-09 ENCOUNTER — Encounter: Payer: Self-pay | Admitting: Obstetrics and Gynecology

## 2012-11-09 VITALS — BP 110/70 | HR 70 | Resp 16 | Ht 67.0 in | Wt 192.0 lb

## 2012-11-09 DIAGNOSIS — Z01419 Encounter for gynecological examination (general) (routine) without abnormal findings: Secondary | ICD-10-CM

## 2012-11-09 DIAGNOSIS — Z124 Encounter for screening for malignant neoplasm of cervix: Secondary | ICD-10-CM

## 2012-11-09 DIAGNOSIS — R3915 Urgency of urination: Secondary | ICD-10-CM

## 2012-11-09 MED ORDER — TOLTERODINE TARTRATE ER 4 MG PO CP24: 4.0000 mg | ORAL_CAPSULE | Freq: Every day | ORAL | Status: DC

## 2012-11-09 MED ORDER — ESTRADIOL 0.0375 MG/24HR TD PTTW: 1.0000 | MEDICATED_PATCH | TRANSDERMAL | Status: DC

## 2012-11-09 MED ORDER — CLOBETASOL PROPIONATE 0.05 % EX OINT: TOPICAL_OINTMENT | Freq: Two times a day (BID) | CUTANEOUS | Status: DC | PRN

## 2012-11-09 NOTE — Progress Notes (Signed)
Contraception Hysterectomy Last pap 11/08/2010 Abnormal Last Mammo 09/2012 WNL Last Colonoscopy 11/2010 WNL Last Dexa Scan None Primary MD Lilyan Punt Abuse at Home None  C/o white patches near rectum previously dx'd by bx (by derm) as lichen sclerosis per pt tx'd with clobetasol.  Also c/o urgency at times and requests refill of estrogen patch.  Filed Vitals:   11/09/12 1002  BP: 110/70  Pulse: 70  Resp: 16   ROS: noncontributory  Physical Examination: General appearance - alert, well appearing, and in no distress Neck - supple, no significant adenopathy Chest - clear to auscultation, no wheezes, rales or rhonchi, symmetric air entry Heart - normal rate and regular rhythm Abdomen - soft, nontender, nondistended, no masses or organomegaly Breasts - breasts appear normal, no suspicious masses, no skin or nipple changes or axillary nodes Pelvic - normal external genitalia, vulva, vagina, no palpable masses, white patches near anus (no itching) Back exam - no CVAT Extremities - no edema, redness or tenderness in the calves or thighs  A/P Lichen sclerosis - clobetasol rx sent Minivelle patch rx sent Urgency is returning - rec kegel exercises, check ucx, if sxs persist, rx for detrol la already sent to pharmacy with refills for the yr. - Pt may call requesting script but it has already been sent.

## 2012-11-11 LAB — URINE CULTURE: Colony Count: 10000

## 2012-11-13 LAB — PAP IG W/ RFLX HPV ASCU

## 2012-12-07 ENCOUNTER — Emergency Department (HOSPITAL_COMMUNITY): Payer: BC Managed Care – PPO

## 2012-12-07 ENCOUNTER — Encounter (HOSPITAL_COMMUNITY): Payer: Self-pay | Admitting: *Deleted

## 2012-12-07 ENCOUNTER — Emergency Department (HOSPITAL_COMMUNITY)
Admission: EM | Admit: 2012-12-07 | Discharge: 2012-12-08 | Disposition: A | Discharge status: Home / Self Care | Payer: BC Managed Care – PPO | Attending: Emergency Medicine | Admitting: Emergency Medicine

## 2012-12-07 DIAGNOSIS — Z8639 Personal history of other endocrine, nutritional and metabolic disease: Secondary | ICD-10-CM | POA: Insufficient documentation

## 2012-12-07 DIAGNOSIS — Z79899 Other long term (current) drug therapy: Secondary | ICD-10-CM | POA: Insufficient documentation

## 2012-12-07 DIAGNOSIS — R109 Unspecified abdominal pain: Secondary | ICD-10-CM

## 2012-12-07 DIAGNOSIS — K529 Noninfective gastroenteritis and colitis, unspecified: Secondary | ICD-10-CM

## 2012-12-07 DIAGNOSIS — R112 Nausea with vomiting, unspecified: Secondary | ICD-10-CM | POA: Insufficient documentation

## 2012-12-07 DIAGNOSIS — Z792 Long term (current) use of antibiotics: Secondary | ICD-10-CM | POA: Insufficient documentation

## 2012-12-07 DIAGNOSIS — R319 Hematuria, unspecified: Secondary | ICD-10-CM | POA: Insufficient documentation

## 2012-12-07 DIAGNOSIS — Z862 Personal history of diseases of the blood and blood-forming organs and certain disorders involving the immune mechanism: Secondary | ICD-10-CM | POA: Insufficient documentation

## 2012-12-07 DIAGNOSIS — K219 Gastro-esophageal reflux disease without esophagitis: Secondary | ICD-10-CM | POA: Insufficient documentation

## 2012-12-07 DIAGNOSIS — Z87828 Personal history of other (healed) physical injury and trauma: Secondary | ICD-10-CM | POA: Insufficient documentation

## 2012-12-07 DIAGNOSIS — K625 Hemorrhage of anus and rectum: Secondary | ICD-10-CM

## 2012-12-07 DIAGNOSIS — R197 Diarrhea, unspecified: Secondary | ICD-10-CM | POA: Insufficient documentation

## 2012-12-07 DIAGNOSIS — J45909 Unspecified asthma, uncomplicated: Secondary | ICD-10-CM | POA: Insufficient documentation

## 2012-12-07 DIAGNOSIS — K5289 Other specified noninfective gastroenteritis and colitis: Secondary | ICD-10-CM | POA: Insufficient documentation

## 2012-12-07 LAB — CBC WITH DIFFERENTIAL/PLATELET
Basophils Absolute: 0 10*3/uL (ref 0.0–0.1)
Basophils Relative: 0 % (ref 0–1)
Eosinophils Absolute: 0 10*3/uL (ref 0.0–0.7)
Eosinophils Relative: 0 % (ref 0–5)
HCT: 42.3 % (ref 36.0–46.0)
Hemoglobin: 14.7 g/dL (ref 12.0–15.0)
Lymphocytes Relative: 28 % (ref 12–46)
Lymphs Abs: 2.7 10*3/uL (ref 0.7–4.0)
MCH: 30.2 pg (ref 26.0–34.0)
MCHC: 34.8 g/dL (ref 30.0–36.0)
MCV: 86.9 fL (ref 78.0–100.0)
Monocytes Absolute: 0.5 10*3/uL (ref 0.1–1.0)
Monocytes Relative: 5 % (ref 3–12)
Neutro Abs: 6.5 10*3/uL (ref 1.7–7.7)
Neutrophils Relative %: 67 % (ref 43–77)
Platelets: 219 10*3/uL (ref 150–400)
RBC: 4.87 MIL/uL (ref 3.87–5.11)
RDW: 12.7 % (ref 11.5–15.5)
WBC: 9.7 10*3/uL (ref 4.0–10.5)

## 2012-12-07 LAB — URINALYSIS, ROUTINE W REFLEX MICROSCOPIC
Bilirubin Urine: NEGATIVE
Glucose, UA: NEGATIVE mg/dL
Ketones, ur: NEGATIVE mg/dL
Leukocytes, UA: NEGATIVE
Nitrite: NEGATIVE
Protein, ur: NEGATIVE mg/dL
Specific Gravity, Urine: <1.005 — ABNORMAL LOW (ref 1.005–1.030)
Urobilinogen, UA: 0.2 mg/dL (ref 0.0–1.0)
pH: 7 (ref 5.0–8.0)

## 2012-12-07 LAB — COMPREHENSIVE METABOLIC PANEL
ALT: 18 U/L (ref 0–35)
AST: 17 U/L (ref 0–37)
Albumin: 4.3 g/dL (ref 3.5–5.2)
Alkaline Phosphatase: 65 U/L (ref 39–117)
BUN: 14 mg/dL (ref 6–23)
CO2: 26 mEq/L (ref 19–32)
Calcium: 9.6 mg/dL (ref 8.4–10.5)
Chloride: 99 mEq/L (ref 96–112)
Creatinine, Ser: 0.68 mg/dL (ref 0.50–1.10)
GFR calc Af Amer: >90 mL/min (ref >90)
GFR calc non Af Amer: >90 mL/min (ref >90)
Glucose, Bld: 99 mg/dL (ref 70–99)
Potassium: 3.4 mEq/L — ABNORMAL LOW (ref 3.5–5.1)
Sodium: 137 mEq/L (ref 135–145)
Total Bilirubin: 0.4 mg/dL (ref 0.3–1.2)
Total Protein: 7.5 g/dL (ref 6.0–8.3)

## 2012-12-07 LAB — LIPASE, BLOOD: Lipase: 31 U/L (ref 11–59)

## 2012-12-07 LAB — URINE MICROSCOPIC-ADD ON

## 2012-12-07 MED ORDER — SODIUM CHLORIDE 0.9 % IV BOLUS (SEPSIS)
1000.0000 mL | Freq: Once | INTRAVENOUS | Status: AC
  Administered 2012-12-07: 1000 mL via INTRAVENOUS

## 2012-12-07 MED ORDER — SODIUM CHLORIDE 0.9 % IV SOLN
INTRAVENOUS | Status: DC
  Administered 2012-12-07: 22:00:00 via INTRAVENOUS

## 2012-12-07 MED ORDER — ONDANSETRON HCL 4 MG/2ML IJ SOLN
4.0000 mg | Freq: Once | INTRAMUSCULAR | Status: AC
  Administered 2012-12-07: 4 mg via INTRAVENOUS
  Filled 2012-12-07: qty 2

## 2012-12-07 MED ORDER — IOHEXOL 300 MG/ML  SOLN
50.0000 mL | Freq: Once | INTRAMUSCULAR | Status: AC | PRN
  Administered 2012-12-07: 50 mL via INTRAVENOUS

## 2012-12-07 MED ORDER — IOHEXOL 300 MG/ML  SOLN
100.0000 mL | Freq: Once | INTRAMUSCULAR | Status: AC | PRN
  Administered 2012-12-07: 100 mL via INTRAVENOUS

## 2012-12-07 NOTE — ED Provider Notes (Signed)
History     CSN: [id number]  Arrival date & time 12/07/12  2016   First MD Initiated Contact with Patient 12/07/12 2027      Chief Complaint  Patient presents with  . Abdominal Pain  . Hematuria    (Consider location/radiation/quality/duration/timing/severity/associated sxs/prior treatment) HPI,,,bilateral lower abdominal pain since approximately midnight today with associated nausea, vomiting, diarrhea. She saw her primary care Dr. At 1 PM. She took a small amount of PO fluid at 4 PM.  Tonight she noted some minimal to moderate rectal bleeding with associated cramping. No radiation of abdominal pain. Severity is moderate. Quality is cramping. No fever, sweats, chills, dysuria.  Has history of internal hemorrhoids via colonoscopy but no other colonic pathology   Past Medical History  Diagnosis Date  . GERD (gastroesophageal reflux disease)   . Asthma     seasonal only  . Palpitations 04/17/2012  . Chest pain 04/17/2012  . Mixed hyperlipidemia 04/17/2012  . TMJ (dislocation of temporomandibular joint) 04/17/2012    Past Surgical History  Procedure Laterality Date  . Knee surgery  2007    right  . Bladder repair  2010  . Vaginal hysterectomy  11/10/2011    Procedure: HYSTERECTOMY VAGINAL;  Surgeon: Purcell Nails, MD;  Location: [facility];  Service: Gynecology;  Laterality: N/A;  Total Vaginal Hysterectomy, bilateral salpingo-oopherectomy, cystoscopy  . Salpingoophorectomy  11/10/2011    Procedure: SALPINGO OOPHERECTOMY;  Surgeon: Purcell Nails, MD;  Location: [facility];  Service: Gynecology;  Laterality: Bilateral;  . Cystoscopy  11/10/2011    Procedure: CYSTOSCOPY;  Surgeon: Purcell Nails, MD;  Location: [facility];  Service: Gynecology;  Laterality: N/A;  . Abdominal hysterectomy      History reviewed. No pertinent family history.  History  Substance Use Topics  . Smoking status: Never Smoker   . Smokeless tobacco: Not on file  . Alcohol Use: Yes     Comment: occasionally wine      OB History   Grav Para Term Preterm Abortions TAB SAB Ect Mult Living   2 2              Review of Systems  Allergies  Azithromycin; Erythromycin; and Penicillins  Home Medications   Current Outpatient Rx  Name  Route  Sig  Dispense  Refill  . celecoxib (CELEBREX) 200 MG capsule   Oral   Take 200 mg by mouth as needed. For Lower Back Pain.         . ciprofloxacin (CIPRO) 500 MG tablet   Oral   Take 500 mg by mouth 2 (two) times daily. For  7days         . clobetasol ointment (TEMOVATE) 0.05 %   Topical   Apply topically 2 (two) times daily as needed.   30 g   0   . estradiol (MINIVELLE) 0.0375 MG/24HR   Transdermal   Place 1 patch onto the skin 2 (two) times a week.   8 patch   11   . GARCINIA CAMBOGIA-CHROMIUM PO   Oral   Take 1 tablet by mouth 3 (three) times daily.         Marland Kitchen GREEN COFFEE BEAN PO   Oral   Take 1 tablet by mouth 2 (two) times daily.         . hyoscyamine (LEVSIN SL) 0.125 MG SL tablet   Sublingual   Place 0.125 mg under the tongue 4 (four) times daily as needed for cramping.         Marland Kitchen  Nutritional Supplements (NUTRITIONAL SUPPLEMENT PO)   Oral   Take 3 tablets by mouth daily. NORDIC NATURALS OMEGA LDL: Product Specifications This supplement contains:  .1,280 mgs of total Omega 3s from purified deep sea fish oil from anchovies and sardines .650 mg EPA .450 mg DHA .180 mg other Omega 3s .Vitamin E and Rosemary Extract to help conserve freshness .Does not contain gluten, yeast, milk derivatives, artificial colors or flavors         . NUTRITIONAL SUPPLEMENTS PO   Oral   Take 3 tablets by mouth daily. LEAN OUT-FAT TRANSPORT SYSTEM: It provides a mega dose of natural occurring lipotropics that are instrumental in the metabolism of fats. In a nutshell, the nutrients in Lean Out help your body retrieve fat out of storage and utilize it as energy. Lean Out is for people who want to reduce body fat, improve their body mass index  (BMI), increase their energy level and who want to hold on to their lean body mass while they lose fat         . ondansetron (ZOFRAN-ODT) 8 MG disintegrating tablet   Oral   Take 8 mg by mouth 3 (three) times daily as needed for nausea.         . pantoprazole (PROTONIX) 40 MG tablet   Oral   Take 40 mg by mouth every morning.            BP 137/85  Pulse 76  Temp(Src) 99.5 F (37.5 C) (Oral)  Resp 20  Ht 5\' 7"  (1.702 m)  Wt 184 lb (83.462 kg)  BMI 28.81 kg/m2  SpO2 99%  LMP 10/13/2011  Physical Exam  Nursing note and vitals reviewed. Constitutional: She is oriented to person, place, and time. She appears well-developed and well-nourished.  HENT:  Head: Normocephalic and atraumatic.  Eyes: Conjunctivae and EOM are normal. Pupils are equal, round, and reactive to light.  Neck: Normal range of motion. Neck supple.  Cardiovascular: Normal rate, regular rhythm and normal heart sounds.   Pulmonary/Chest: Effort normal and breath sounds normal.  Abdominal: Soft. Bowel sounds are normal.  Minimal diffuse lower abdominal tenderness  Genitourinary:  Rectal exam reveals no masses. glove had gross blood on it.  Musculoskeletal: Normal range of motion.  Neurological: She is alert and oriented to person, place, and time.  Skin: Skin is warm and dry.  Psychiatric: She has a normal mood and affect.    ED Course  Procedures (including critical care time)  Labs Reviewed  COMPREHENSIVE METABOLIC PANEL - Abnormal; Notable for the following:    Potassium 3.4 (*)    All other components within normal limits  CBC WITH DIFFERENTIAL  LIPASE, BLOOD  URINALYSIS, ROUTINE W REFLEX MICROSCOPIC   No results found.   No diagnosis found.    MDM  I am concerned about the possibility of colitis. CT scan of abdomen and pelvis with contrast pending. IV hydration. Anti-emetics. Discussed with Dr. Ileana Roup, MD 12/07/12 2152

## 2012-12-07 NOTE — ED Notes (Signed)
POCT Hemacult POSITIVE per Misty Stanley NT

## 2012-12-07 NOTE — ED Notes (Signed)
abd pain, NVD, seen by Dr Gerda Diss today with blood work done  Started having rectal bleeding today

## 2012-12-07 NOTE — ED Notes (Signed)
Pt states she was seen at dr Cathlyn Parsons office today for abd pain and cramping as well as diarrhea. She has blood work, stool sample done and was given rocephin IM. States since then she has seen visible red blood in her stool. Pt is aax4 at current, NAD noted, pt changing into gown now.

## 2012-12-07 NOTE — ED Provider Notes (Addendum)
Shelda Jakes, MD  Results for orders placed during the hospital encounter of 12/07/12  COMPREHENSIVE METABOLIC PANEL      Result Value Range   Sodium 137  135 - 145 mEq/L   Potassium 3.4 (*) 3.5 - 5.1 mEq/L   Chloride 99  96 - 112 mEq/L   CO2 26  19 - 32 mEq/L   Glucose, Bld 99  70 - 99 mg/dL   BUN 14  6 - 23 mg/dL   Creatinine, Ser 2.95  0.50 - 1.10 mg/dL   Calcium 9.6  8.4 - 62.1 mg/dL   Total Protein 7.5  6.0 - 8.3 g/dL   Albumin 4.3  3.5 - 5.2 g/dL   AST 17  0 - 37 U/L   ALT 18  0 - 35 U/L   Alkaline Phosphatase 65  39 - 117 U/L   Total Bilirubin 0.4  0.3 - 1.2 mg/dL   GFR calc non Af Amer >90  >90 mL/min   GFR calc Af Amer >90  >90 mL/min  CBC WITH DIFFERENTIAL      Result Value Range   WBC 9.7  4.0 - 10.5 K/uL   RBC 4.87  3.87 - 5.11 MIL/uL   Hemoglobin 14.7  12.0 - 15.0 g/dL   HCT 30.8  65.7 - 84.6 %   MCV 86.9  78.0 - 100.0 fL   MCH 30.2  26.0 - 34.0 pg   MCHC 34.8  30.0 - 36.0 g/dL   RDW 96.2  95.2 - 84.1 %   Platelets 219  150 - 400 K/uL   Neutrophils Relative 67  43 - 77 %   Neutro Abs 6.5  1.7 - 7.7 K/uL   Lymphocytes Relative 28  12 - 46 %   Lymphs Abs 2.7  0.7 - 4.0 K/uL   Monocytes Relative 5  3 - 12 %   Monocytes Absolute 0.5  0.1 - 1.0 K/uL   Eosinophils Relative 0  0 - 5 %   Eosinophils Absolute 0.0  0.0 - 0.7 K/uL   Basophils Relative 0  0 - 1 %   Basophils Absolute 0.0  0.0 - 0.1 K/uL  LIPASE, BLOOD      Result Value Range   Lipase 31  11 - 59 U/L  URINALYSIS, ROUTINE W REFLEX MICROSCOPIC      Result Value Range   Color, Urine YELLOW  YELLOW   APPearance CLEAR  CLEAR   Specific Gravity, Urine <1.005 (*) 1.005 - 1.030   pH 7.0  5.0 - 8.0   Glucose, UA NEGATIVE  NEGATIVE mg/dL   Hgb urine dipstick TRACE (*) NEGATIVE   Bilirubin Urine NEGATIVE  NEGATIVE   Ketones, ur NEGATIVE  NEGATIVE mg/dL   Protein, ur NEGATIVE  NEGATIVE mg/dL   Urobilinogen, UA 0.2  0.0 - 1.0 mg/dL   Nitrite NEGATIVE  NEGATIVE   Leukocytes, UA NEGATIVE  NEGATIVE   URINE MICROSCOPIC-ADD ON      Result Value Range   Squamous Epithelial / LPF RARE  RARE   WBC, UA 0-2  <3 WBC/hpf   RBC / HPF 0-2  <3 RBC/hpf   Bacteria, UA MANY (*) RARE   Ct Abdomen Pelvis W Contrast  12/07/2012  *RADIOLOGY REPORT*  Clinical Data: Abdominal pain, rectal bleeding  CT ABDOMEN AND PELVIS WITH CONTRAST  Technique:  Multidetector CT imaging of the abdomen and pelvis was performed following the standard protocol during bolus administration of intravenous contrast.  Contrast: 50mL OMNIPAQUE IOHEXOL 300  MG/ML  SOLN, OMNIPAQUE IOHEXOL 300 MG/ML  SOLN  Comparison: None.  Findings: Normal heart size.  Lung bases are clear.  No pleural effusion or pericardial effusion.  Cystic focus within the left hepatic lobe, most in keeping with a biliary cyst.  Unremarkable biliary system, spleen, pancreas, adrenal glands.  Subcentimeter hypodensity upper pole left kidney is nonspecific. Otherwise, symmetric renal enhancement.  No hydronephrosis or hydroureter.  Thickened segment descending colon measures approximately 11 cm in length.  There is mild surrounding pericolonic fat stranding.  The transverse colon appears normal.  Small bowel loops are normal course and caliber.  No free intraperitoneal air or fluid.  No lymphadenopathy.  Normal caliber aorta and branch vessels.  Thin-walled bladder.  Absent uterus.  No adnexal mass.  Normal appendix.  Tiny fat containing umbilical hernia.  Lumbosacral degenerative changes.  No acute osseous finding.  IMPRESSION: Thickened segment of the descending colon with mild pericolonic fat stranding.  This is in keeping with a nonspecific colitis (infectious, ischemic, and inflammatory considerations).  Recommend colonoscopy follow-up.  Subcentimeter hypodensity within the upper pole left kidney.  There are a 83-month ultrasound follow-up.   Original Report Authenticated By: Jearld Lesch, M.D.      Patient is feeling much better. Sure he has antinausea medicine  provided by her primary care Dr. and she has anti-spasmatic the medicine also provided by them. Patient's hemoglobin and hematocrit is very stable CT scan does show a descending colon colitis. She will followup with her primary care Dr. colonoscopy  Will most likely be recommended. Patient will return for any new or worse bleeding or worse symptoms.      Shelda Jakes, MD 12/07/12 1610  Shelda Jakes, MD 12/07/12 2351

## 2012-12-07 NOTE — ED Notes (Signed)
Dr Deretha Emory in room speaking with patient

## 2012-12-07 NOTE — ED Notes (Signed)
Dr Adriana Simas in room assessing pt and obtaining hemacult stool sample with Misty Stanley NT.

## 2012-12-10 LAB — URINE CULTURE: Colony Count: 30000

## 2012-12-10 LAB — OCCULT BLOOD, POC DEVICE: Fecal Occult Bld: POSITIVE — AB

## 2012-12-11 ENCOUNTER — Encounter (INDEPENDENT_AMBULATORY_CARE_PROVIDER_SITE_OTHER): Payer: Self-pay | Admitting: Internal Medicine

## 2012-12-11 ENCOUNTER — Ambulatory Visit (INDEPENDENT_AMBULATORY_CARE_PROVIDER_SITE_OTHER): Payer: BC Managed Care – PPO | Admitting: Internal Medicine

## 2012-12-11 VITALS — BP 104/70 | HR 68 | Temp 97.2°F | Resp 18 | Ht 67.0 in | Wt 189.3 lb

## 2012-12-11 DIAGNOSIS — K559 Vascular disorder of intestine, unspecified: Secondary | ICD-10-CM

## 2012-12-11 NOTE — Patient Instructions (Addendum)
Finish up on Cipro and metronidazole. Gradually positioned to regular diet over the next 3-4 days. Notify if symptoms recur. Discontinue 2 herbal medications as discussed

## 2012-12-11 NOTE — Progress Notes (Signed)
Presenting complaint;  Acute colitis. Abdominal pain diarrhea and rectal bleeding.  History of present illness;  Nancy Robertson is 47 year old Caucasian female who is here for evaluation as requested by Dr. Lilyan Punt. She was last seen in January 2012 for hematochezia and underwent colonoscopy in February 2012 revealing external hemorrhoids. She now presents with acute onset of abdominal pain diarrhea and rectal bleeding. She woke up around 12:30 AM on 12/07/2012 with severe cramping abdominal pain associated with nausea vomiting and diarrhea. She noted rather strong odor to her stool. She also had cold chills and profuse sweating. She was evaluated by Dr. Fay Records that afternoon and begun on Cipro. CBC and metabolic 7 were within normal limits. That evening she started to pass bright red blood per rectum and small clots. She was therefore evaluated in emergency room at [facility] and underwent abdominopelvic CT with showed changes of left-sided colitis. She was reevaluated by Dr. Lilyan Punt yesterday and metronidazole was added and this visit was arranged. She remains on full liquids. She did try to eat grilled chicken yesterday and developed left-sided pain. She has not passed any blood per rectum in the last 2 days but her stools are still not formed. She denies fever or skin rash. She recalled that she had pork chop which 2 of her children also had and there did not get sick. She tells me she did start on to herbal medications earlier in the week. There is no history of recent travel or antibiotic use within the last 3 months.  Current Medications: Current Outpatient Prescriptions  Medication Sig Dispense Refill  . ciprofloxacin (CIPRO) 500 MG tablet Take 500 mg by mouth 2 (two) times daily. For  7days      . clobetasol ointment (TEMOVATE) 0.05 % Apply topically 2 (two) times daily as needed.  30 g  0  . estradiol (MINIVELLE) 0.0375 MG/24HR Place 1 patch onto the skin 2 (two) times a  week.  8 patch  11  . GARCINIA CAMBOGIA-CHROMIUM PO Take 1 tablet by mouth 3 (three) times daily.      Marland Kitchen GREEN COFFEE BEAN PO Take 1 tablet by mouth 2 (two) times daily.      . hyoscyamine (LEVSIN SL) 0.125 MG SL tablet Place 0.125 mg under the tongue 4 (four) times daily as needed for cramping.      . metroNIDAZOLE (FLAGYL) 500 MG tablet Take 500 mg by mouth.      . Nutritional Supplements (NUTRITIONAL SUPPLEMENT PO) Take 3 tablets by mouth daily. NORDIC NATURALS OMEGA LDL: Product Specifications This supplement contains:  .1,280 mgs of total Omega 3s from purified deep sea fish oil from anchovies and sardines .650 mg EPA .450 mg DHA .180 mg other Omega 3s .Vitamin E and Rosemary Extract to help conserve freshness .Does not contain gluten, yeast, milk derivatives, artificial colors or flavors      . NUTRITIONAL SUPPLEMENTS PO Take 3 tablets by mouth daily. LEAN OUT-FAT TRANSPORT SYSTEM: It provides a mega dose of natural occurring lipotropics that are instrumental in the metabolism of fats. In a nutshell, the nutrients in Lean Out help your body retrieve fat out of storage and utilize it as energy. Lean Out is for people who want to reduce body fat, improve their body mass index (BMI), increase their energy level and who want to hold on to their lean body mass while they lose fat      . pantoprazole (PROTONIX) 40 MG tablet Take 40 mg by mouth  every morning.       . celecoxib (CELEBREX) 200 MG capsule Take 200 mg by mouth as needed. For Lower Back Pain.      . ondansetron (ZOFRAN-ODT) 8 MG disintegrating tablet Take 8 mg by mouth 3 (three) times daily as needed for nausea.       No current facility-administered medications for this visit.   Past medical history; GERD. Normal EGD in October 2004. Colonoscopy in December 1998 revealing anal fissure. Colonoscopy in October 2004 revealing external hemorrhoids. Colonoscopy in February 2012 for hematochezia secondary to hemorrhoids History of  bronchial asthma. Hyperlipidemia. Chronic LBP. History of endometriosis. Remote laparoscopy with removal of ovarian cyst. Status post hysterectomy. Right knee arthroscopy.  Allergies; Allergies  Allergen Reactions  . Azithromycin Nausea Only  . Erythromycin Nausea And Vomiting  . Penicillins     Reaction from Childhood.   Family history; History of colonic polyps in a brother and father.  Social history; She is married. She has one daughter in good health. She works at [employer]. She does not smoke cigarettes and drink one or 2 drinks of wine over the weekend.    Objective: Blood pressure 104/70, pulse 68, temperature 97.2 F (36.2 C), temperature source Oral, resp. rate 18, height 5\' 7"  (1.702 m), weight 189 lb 4.8 oz (85.866 kg), last menstrual period 10/13/2011. Patient is alert and in no acute distress. Conjunctiva is pink. Sclera is nonicteric Oropharyngeal mucosa is normal. No neck masses or thyromegaly noted. Cardiac exam with regular rhythm normal S1 and S2. No murmur or gallop noted. Lungs are clear to auscultation. Abdomen is symmetrical. Bowel sounds are normal. No bruits noted. Abdomen is soft with mild tenderness in that he umbilicus region as well as LLQ. No guarding or rebound. No organomegaly or masses. No LE edema or clubbing noted.  Labs/studies Results: Lab from 12/07/2012. WBC 10.2, H&H 15 and 43.2, platelet count 217 K. and differential was normal. Serum sodium 140, potassium 4.5, chloride 99, CO2 30, glucose 89, BUN 14, creatinine 0.7 and calcium 10.2. Abdominopelvic CT films reviewed with patient. She has localized wall thickening to descending colon. Small cyst upper pole of left kidney.  Assessment:  Patient's symptom complex is felt to be typical of ischemic colitis and so are CT findings. Suspect this episode was triggered with use of herbal medications specifically due to Garcinia Cambogia.   Recommendations;   Patient advised to  discontinue  Two herbal medications she started last week(Garcinia Cambogia and Green coffee Bean). She should continue Cipro and metronidazole until prescription runs out. She should gradually advance her diet. Patient will call if symptoms have not completely resolved in one week.

## 2013-01-02 ENCOUNTER — Encounter (INDEPENDENT_AMBULATORY_CARE_PROVIDER_SITE_OTHER): Payer: Self-pay

## 2013-02-08 ENCOUNTER — Telehealth: Payer: Self-pay | Admitting: Family Medicine

## 2013-02-08 MED ORDER — PANTOPRAZOLE SODIUM 40 MG PO TBEC: 40.0000 mg | DELAYED_RELEASE_TABLET | ORAL | Status: DC

## 2013-02-08 NOTE — Telephone Encounter (Signed)
Patient would like RX of Protonix 40 mg to [facility] in [place]

## 2013-02-21 ENCOUNTER — Other Ambulatory Visit: Payer: Self-pay | Admitting: Obstetrics and Gynecology

## 2013-02-21 DIAGNOSIS — N6311 Unspecified lump in the right breast, upper outer quadrant: Secondary | ICD-10-CM

## 2013-03-07 ENCOUNTER — Ambulatory Visit
Admission: RE | Admit: 2013-03-07 | Discharge: 2013-03-07 | Disposition: A | Discharge status: Home / Self Care | Payer: BC Managed Care – PPO | Source: Ambulatory Visit | Attending: Obstetrics and Gynecology | Admitting: Obstetrics and Gynecology

## 2013-03-07 ENCOUNTER — Other Ambulatory Visit: Payer: Self-pay | Admitting: Obstetrics and Gynecology

## 2013-03-07 DIAGNOSIS — N6311 Unspecified lump in the right breast, upper outer quadrant: Secondary | ICD-10-CM

## 2013-03-11 ENCOUNTER — Ambulatory Visit
Admission: RE | Admit: 2013-03-11 | Discharge: 2013-03-11 | Disposition: A | Discharge status: Home / Self Care | Payer: BC Managed Care – PPO | Source: Ambulatory Visit | Attending: Obstetrics and Gynecology | Admitting: Obstetrics and Gynecology

## 2013-03-11 ENCOUNTER — Other Ambulatory Visit: Payer: Self-pay | Admitting: Obstetrics and Gynecology

## 2013-03-11 DIAGNOSIS — N6311 Unspecified lump in the right breast, upper outer quadrant: Secondary | ICD-10-CM

## 2013-03-19 ENCOUNTER — Telehealth: Payer: Self-pay | Admitting: Family Medicine

## 2013-03-19 NOTE — Telephone Encounter (Signed)
Patient would like a prescription for Alprazolam small dose.  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  States if she needs an office visit please let her know as soon as possible so she can arrange her schedule accordingly.  [facility] in [place].  Thanks

## 2013-03-19 NOTE — Telephone Encounter (Signed)
May prescribe Xanax 0.25 mg. #20. Cautioned drowsiness. Half or a whole tablet every 6 hours as needed. If ongoing need I recommend an office visit. May call this in.

## 2013-03-19 NOTE — Telephone Encounter (Signed)
Called in Xanax 0.25 mg. #20. Cautioned drowsiness. Half or a whole tablet every 6 hours as needed to [facility] [place]. Left message on voicemail notifying patient.

## 2013-04-26 ENCOUNTER — Other Ambulatory Visit: Payer: Self-pay | Admitting: *Deleted

## 2013-04-26 ENCOUNTER — Ambulatory Visit (INDEPENDENT_AMBULATORY_CARE_PROVIDER_SITE_OTHER): Payer: BC Managed Care – PPO | Admitting: Nurse Practitioner

## 2013-04-26 ENCOUNTER — Encounter: Payer: Self-pay | Admitting: Nurse Practitioner

## 2013-04-26 VITALS — BP 120/94 | Wt 197.8 lb

## 2013-04-26 DIAGNOSIS — M545 Low back pain, unspecified: Secondary | ICD-10-CM

## 2013-04-26 DIAGNOSIS — G8929 Other chronic pain: Secondary | ICD-10-CM

## 2013-04-26 MED ORDER — CELECOXIB 400 MG PO CAPS: 400.0000 mg | ORAL_CAPSULE | Freq: Every day | ORAL | Status: DC

## 2013-04-26 MED ORDER — CELECOXIB 400 MG PO CAPS: 400.0000 mg | ORAL_CAPSULE | Freq: Two times a day (BID) | ORAL | Status: DC

## 2013-04-26 MED ORDER — AMITRIPTYLINE HCL 10 MG PO TABS: 10.0000 mg | ORAL_TABLET | Freq: Every day | ORAL | Status: DC

## 2013-04-29 ENCOUNTER — Encounter: Payer: Self-pay | Admitting: Nurse Practitioner

## 2013-04-29 DIAGNOSIS — M545 Other chronic pain: Secondary | ICD-10-CM | POA: Insufficient documentation

## 2013-04-29 DIAGNOSIS — G8929 Other chronic pain: Secondary | ICD-10-CM | POA: Insufficient documentation

## 2013-04-29 NOTE — Progress Notes (Signed)
Subjective:  Presents for recheck on her chronic low back pain. Has had a flareup of her the past 2-3 weeks, was doing some heavy lifting for her daughter's wedding. Sees Dr. Ladona Ridgel on regular basis for chiropractic adjustments which helps. Also wearing a TENS unit. Has been on Celebrex 200 mg for while, recently increased this to twice a day which greatly helped her symptoms. No neuropathic symptoms. Pain localized to the low back area.  Objective:   BP 120/94  Wt 197 lb 12.8 oz (89.721 kg)  BMI 30.97 kg/m2  LMP 10/13/2011 NAD. Alert, oriented. Lungs clear. Heart regular rhythm. No spinal tenderness. Mild tenderness to palpation in the low back area particularly along the sacroiliac joint. SLR negative bilaterally. Reflexes normal limit. Gait normal limit.  Assessment:Chronic low back pain  Plan: Increase Celebrex to 400 mg daily. Plan to go back to 200 mg once pain has improved. Restart Elavil 10 mg daily. This has helped patient in the past. Call back in 10-14 days if no improvement, sooner if worse. Continue other pain relief measures.

## 2013-04-29 NOTE — Assessment & Plan Note (Signed)
Plan: Increase Celebrex to 400 mg daily. Plan to go back to 200 mg once pain has improved. Restart Elavil 10 mg daily. This has helped patient in the past. Call back in 10-14 days if no improvement, sooner if worse. Continue other pain relief measures.

## 2013-07-01 ENCOUNTER — Other Ambulatory Visit: Payer: Self-pay | Admitting: Family Medicine

## 2013-07-13 ENCOUNTER — Encounter: Payer: Self-pay | Admitting: *Deleted

## 2013-07-15 ENCOUNTER — Ambulatory Visit (INDEPENDENT_AMBULATORY_CARE_PROVIDER_SITE_OTHER): Payer: BC Managed Care – PPO | Admitting: Family Medicine

## 2013-07-15 ENCOUNTER — Encounter: Payer: Self-pay | Admitting: Family Medicine

## 2013-07-15 VITALS — BP 110/70 | Ht 67.0 in | Wt 199.0 lb

## 2013-07-15 DIAGNOSIS — Q619 Cystic kidney disease, unspecified: Secondary | ICD-10-CM

## 2013-07-15 DIAGNOSIS — E782 Mixed hyperlipidemia: Secondary | ICD-10-CM

## 2013-07-15 DIAGNOSIS — N281 Cyst of kidney, acquired: Secondary | ICD-10-CM | POA: Insufficient documentation

## 2013-07-15 NOTE — Progress Notes (Signed)
  Subjective:    Patient ID: Nancy Robertson, female    DOB: 1966/06/29, 47 y.o.   MRN: [id number]  HPI  Patient arrives to follow up on abnormal CT from Feb and to discuss labs from the GYN Patient had CT scan back in February at that time it showed a hyper dense area in the left kidney it was recommended 6 months later to get ultrasound she follows up down to get this set up. She denies any chest tightness pressure pain shortness breath hematuria vomiting or diarrhea. No fevers.  She relates that she did have her cholesterol checked a few months ago she brings is in for review her LDL is mildly elevated in the 140s. She does have a family history heart disease and diabetes. Placed a she does try to watch her diet she does try to exercise some but she is under a lot of stress and therefore has not been able to exercise much and she like because of time constraints in addition to this he is trying eat healthy her weight is in the 190s she is trying to bring that back down into the 170s and she would love to get her weight down to 150. She hopes to get that one day she tried diet medications but it caused problems with her.  She's also seen a gynecologist who is doing some testing on DHEA and cortisol levels and is working with hormones to try to help her. She denies any particular problems with that her only.  Family history heart disease hypertension obesity past medical history benign ROS see per above Review of Systems She denies headaches chest pain shortness breath wheezing difficulty breathing vomiting diarrhea rectal bleeding.    Objective:   Physical Exam Lungs are clear no crackles heart is regular. Pulse normal blood pressure good extremities no edema in the ankles  20 minutes was spent with the patient discussing her lab results. Using cholesterol calculate her her risk of heart disease in the next 10 years is 1% or less this does not justify the use of statins.       Assessment &  Plan:  #1 hyperlipidemia-statins not indicated see discussion above. Watch diet closely recheck it again in the spring. Patient states she might try red rice yeast extract again. I told her that I felt that that would be okay for her to do. #2 obesity-diet and exercise would be her best approach medications did not help other medications run the risk of side effects. Patient will encourage her husband to also help her stay on track #3 renal cyst-we will do ultrasound of this area. May end up needing MRI or consult with urology but for the present ultrasound results as needed.

## 2013-07-18 ENCOUNTER — Ambulatory Visit (HOSPITAL_COMMUNITY)
Admission: RE | Admit: 2013-07-18 | Discharge: 2013-07-18 | Disposition: A | Discharge status: Home / Self Care | Payer: BC Managed Care – PPO | Source: Ambulatory Visit | Attending: Family Medicine | Admitting: Family Medicine

## 2013-07-18 ENCOUNTER — Other Ambulatory Visit (HOSPITAL_COMMUNITY): Payer: BC Managed Care – PPO

## 2013-07-18 DIAGNOSIS — N281 Cyst of kidney, acquired: Secondary | ICD-10-CM | POA: Insufficient documentation

## 2013-07-29 ENCOUNTER — Encounter: Payer: Self-pay | Admitting: Nurse Practitioner

## 2013-07-29 ENCOUNTER — Ambulatory Visit (INDEPENDENT_AMBULATORY_CARE_PROVIDER_SITE_OTHER): Payer: BC Managed Care – PPO | Admitting: Nurse Practitioner

## 2013-07-29 VITALS — BP 122/64 | Ht 67.0 in | Wt 198.8 lb

## 2013-07-29 DIAGNOSIS — K219 Gastro-esophageal reflux disease without esophagitis: Secondary | ICD-10-CM

## 2013-07-29 MED ORDER — LORCASERIN HCL 10 MG PO TABS: 10.0000 mg | ORAL_TABLET | Freq: Two times a day (BID) | ORAL | Status: DC

## 2013-07-29 MED ORDER — PANTOPRAZOLE SODIUM 40 MG PO TBEC: DELAYED_RELEASE_TABLET | ORAL | Status: DC

## 2013-07-31 ENCOUNTER — Other Ambulatory Visit: Payer: Self-pay | Admitting: Obstetrics and Gynecology

## 2013-07-31 ENCOUNTER — Encounter: Payer: Self-pay | Admitting: Nurse Practitioner

## 2013-07-31 DIAGNOSIS — N63 Unspecified lump in unspecified breast: Secondary | ICD-10-CM

## 2013-07-31 NOTE — Assessment & Plan Note (Signed)
Start Belviq 10 mg BID

## 2013-07-31 NOTE — Progress Notes (Signed)
Subjective:  Presents to discuss weight loss medication. Interested in trying Belviq. Has tried exercise and dieting with some success. No CP, SOB. Reflux is stable on medication.   Objective:   BP 122/64  Ht 5\' 7"  (1.702 m)  Wt 198 lb 12.8 oz (90.175 kg)  BMI 31.13 kg/m2  LMP 10/13/2011 NAD. Alert,oriented. Lungs clear. Heart RRR.  Assessment: GERD (gastroesophageal reflux disease)  Morbid obesity  Plan:  Meds ordered this encounter  Medications  . celecoxib (CELEBREX) 200 MG capsule    Sig: Take 200 mg by mouth daily.  . Multiple Vitamin (MULTIVITAMIN) tablet    Sig: Take 1 tablet by mouth daily.  Marland Kitchen UNABLE TO FIND    Sig: Progesterone cream once daily  . Lorcaserin HCl (BELVIQ) 10 MG TABS    Sig: Take 10 mg by mouth 2 (two) times daily.    Dispense:  30 tablet    Refill:  0    Order Specific Question:  Supervising Provider    Answer:  Merlyn Albert [[id number]]  . Lorcaserin HCl (BELVIQ) 10 MG TABS    Sig: Take 10 mg by mouth 2 (two) times daily.    Dispense:  60 tablet    Refill:  2    Order Specific Question:  Supervising Provider    Answer:  Merlyn Albert [[id number]]  . pantoprazole (PROTONIX) 40 MG tablet    Sig: TAKE 1 TABLET BY MOUTH EVERY MORNING    Dispense:  30 tablet    Refill:  5    Order Specific Question:  Supervising Provider    Answer:  Merlyn Albert [[id number]]   Reviewed potential adverse effects of Belviq. D/C med and call if any problems. If patient decides to take med, recheck in 3 months.

## 2013-08-14 ENCOUNTER — Other Ambulatory Visit: Payer: BC Managed Care – PPO

## 2013-08-28 ENCOUNTER — Ambulatory Visit
Admission: RE | Admit: 2013-08-28 | Discharge: 2013-08-28 | Disposition: A | Discharge status: Home / Self Care | Payer: BC Managed Care – PPO | Source: Ambulatory Visit | Attending: Obstetrics and Gynecology | Admitting: Obstetrics and Gynecology

## 2013-08-28 ENCOUNTER — Other Ambulatory Visit: Payer: Self-pay | Admitting: Obstetrics and Gynecology

## 2013-08-28 DIAGNOSIS — N63 Unspecified lump in unspecified breast: Secondary | ICD-10-CM

## 2013-09-03 ENCOUNTER — Ambulatory Visit (INDEPENDENT_AMBULATORY_CARE_PROVIDER_SITE_OTHER): Payer: BC Managed Care – PPO

## 2013-09-03 DIAGNOSIS — Z23 Encounter for immunization: Secondary | ICD-10-CM

## 2013-11-20 ENCOUNTER — Other Ambulatory Visit: Payer: Self-pay | Admitting: Nurse Practitioner

## 2013-11-21 ENCOUNTER — Telehealth: Payer: Self-pay | Admitting: Family Medicine

## 2013-11-21 NOTE — Telephone Encounter (Signed)
Patient would like Rx for Belviq to [facility]. Does she need an appointment?

## 2013-11-21 NOTE — Telephone Encounter (Signed)
Notified patient rx for Belviq was faxed to pharmacy. Patient transferred to front desk to schedule appointment.

## 2013-11-25 ENCOUNTER — Encounter: Payer: Self-pay | Admitting: Nurse Practitioner

## 2013-11-25 ENCOUNTER — Ambulatory Visit (INDEPENDENT_AMBULATORY_CARE_PROVIDER_SITE_OTHER): Payer: BC Managed Care – PPO | Admitting: Nurse Practitioner

## 2013-11-25 VITALS — BP 122/82 | Ht 67.0 in | Wt 194.0 lb

## 2013-11-25 DIAGNOSIS — E785 Hyperlipidemia, unspecified: Secondary | ICD-10-CM

## 2013-11-25 DIAGNOSIS — E782 Mixed hyperlipidemia: Secondary | ICD-10-CM

## 2013-11-25 MED ORDER — LORCASERIN HCL 10 MG PO TABS: ORAL_TABLET | ORAL | Status: DC

## 2013-11-30 LAB — HEPATIC FUNCTION PANEL
ALT: 11 U/L (ref 0–35)
AST: 12 U/L (ref 0–37)
Albumin: 4.5 g/dL (ref 3.5–5.2)
Alkaline Phosphatase: 46 U/L (ref 39–117)
Bilirubin, Direct: 0.1 mg/dL (ref 0.0–0.3)
Indirect Bilirubin: 0.3 mg/dL (ref 0.2–1.2)
Total Bilirubin: 0.4 mg/dL (ref 0.2–1.2)
Total Protein: 6.7 g/dL (ref 6.0–8.3)

## 2013-11-30 LAB — LIPID PANEL
Cholesterol: 222 mg/dL — ABNORMAL HIGH (ref 0–200)
HDL: 39 mg/dL — ABNORMAL LOW (ref >39)
LDL Cholesterol: 112 mg/dL — ABNORMAL HIGH (ref 0–99)
Total CHOL/HDL Ratio: 5.7 Ratio
Triglycerides: 354 mg/dL — ABNORMAL HIGH (ref <150)
VLDL: 71 mg/dL — ABNORMAL HIGH (ref 0–40)

## 2013-12-01 ENCOUNTER — Encounter: Payer: Self-pay | Admitting: Nurse Practitioner

## 2013-12-01 NOTE — Progress Notes (Signed)
Subjective:  Presents for recheck on her Belviq. Denies any adverse affects. Would like to continue for now. Overall eats a very healthy diet. Low-fat low-cholesterol. Limited exercise. Positive family history of heart disease. Has tried over-the-counter supplements for her cholesterol, is due for a recheck. No chest pain shortness of breath.  Objective:   BP 122/82  Ht 5\' 7"  (1.702 m)  Wt 194 lb (87.998 kg)  BMI 30.38 kg/m2  LMP 10/13/2011 NAD. Alert, oriented. Lungs clear. Heart regular rate rhythm.  Assessment: Problem List Items Addressed This Visit     Other   Mixed hyperlipidemia   Morbid obesity   Relevant Medications      Lorcaserin HCl (BELVIQ) 10 MG TABS    Other Visit Diagnoses   Hyperlipemia    -  Primary    Relevant Orders       Hepatic function panel (Completed)       Lipid panel (Completed)      Plan: Meds ordered this encounter  Medications  . Calcium Carbonate-Vit D-Min (CALCIUM 1200 PO)    Sig: Take 1,200 mg by mouth.  . Cholecalciferol (VITAMIN D) 2000 UNITS CAPS    Sig: Take 2,000 Units by mouth.  . Lorcaserin HCl (BELVIQ) 10 MG TABS    Sig: TAKE 1 TABLET BY MOUTH TWICE DAILY    Dispense:  60 tablet    Refill:  2    Please dispense name brand    Order Specific Question:  Supervising Provider    Answer:  Mikey Kirschner [[id number]]   Recommend regular exercise. If lipid profile remains elevated, patient agrees to consider medication.  Return in about 3 months (around 02/22/2014).

## 2013-12-02 ENCOUNTER — Other Ambulatory Visit: Payer: Self-pay | Admitting: Nurse Practitioner

## 2013-12-02 DIAGNOSIS — E782 Mixed hyperlipidemia: Secondary | ICD-10-CM

## 2013-12-02 MED ORDER — PRAVASTATIN SODIUM 10 MG PO TABS: 10.0000 mg | ORAL_TABLET | Freq: Every day | ORAL | Status: DC

## 2013-12-02 NOTE — Progress Notes (Signed)
Left message on voicemail notifying patient that med was sent in and bloodwork was ordered.

## 2014-01-21 ENCOUNTER — Encounter: Payer: Self-pay | Admitting: Family Medicine

## 2014-01-21 ENCOUNTER — Ambulatory Visit (INDEPENDENT_AMBULATORY_CARE_PROVIDER_SITE_OTHER): Payer: BC Managed Care – PPO | Admitting: Family Medicine

## 2014-01-21 VITALS — BP 110/80 | Temp 98.3°F | Ht 67.0 in | Wt 189.1 lb

## 2014-01-21 DIAGNOSIS — E781 Pure hyperglyceridemia: Secondary | ICD-10-CM | POA: Insufficient documentation

## 2014-01-21 DIAGNOSIS — R5381 Other malaise: Secondary | ICD-10-CM

## 2014-01-21 DIAGNOSIS — R5383 Other fatigue: Secondary | ICD-10-CM

## 2014-01-21 MED ORDER — LEVOFLOXACIN 500 MG PO TABS: 500.0000 mg | ORAL_TABLET | Freq: Every day | ORAL | Status: DC

## 2014-01-21 MED ORDER — FLUTICASONE PROPIONATE 50 MCG/ACT NA SUSP: 2.0000 | Freq: Every day | NASAL | Status: DC

## 2014-01-21 NOTE — Progress Notes (Signed)
   Subjective:    Patient ID: Nancy Robertson, female    DOB: 03-28-1966, 48 y.o.   MRN: [id number]  Sinusitis This is a new problem. The current episode started in the past 7 days. The problem has been gradually worsening since onset. There has been no fever. The pain is moderate. Associated symptoms include congestion, headaches, sinus pressure and sneezing. Past treatments include spray decongestants. The treatment provided no relief.   she has family history diabetes her triglycerides are running high I encourage her to watch starch is in the diet stay physically active     Review of Systems  HENT: Positive for congestion, sinus pressure and sneezing.   Neurological: Positive for headaches.   she denies wheezing nausea vomiting shortness of breath     Objective:   Physical Exam  Mild right sinus tenderness left side is normal eardrums normal throat is normal neck no masses lungs are clear no crackles heart regular      Assessment & Plan:  Allergic rhinitis along with sinusitis may use nasal decongestants for 3-5 days, OTC allergy medicines are permissible, avoid decongestants. Also antibiotics prescribed warning signs discussed  Patient's triglycerides have been high we will check thyroid function and hemoglobin A1c she'll followup in a few months after checking her next lab

## 2014-01-26 ENCOUNTER — Other Ambulatory Visit: Payer: Self-pay | Admitting: Nurse Practitioner

## 2014-02-24 ENCOUNTER — Ambulatory Visit: Payer: BC Managed Care – PPO | Admitting: Nurse Practitioner

## 2014-02-27 ENCOUNTER — Other Ambulatory Visit: Payer: Self-pay | Admitting: Nurse Practitioner

## 2014-03-05 ENCOUNTER — Encounter: Payer: Self-pay | Admitting: Nurse Practitioner

## 2014-03-05 ENCOUNTER — Ambulatory Visit (INDEPENDENT_AMBULATORY_CARE_PROVIDER_SITE_OTHER): Payer: BC Managed Care – PPO | Admitting: Nurse Practitioner

## 2014-03-05 VITALS — BP 122/82 | Ht 67.0 in | Wt 184.6 lb

## 2014-03-05 DIAGNOSIS — E781 Pure hyperglyceridemia: Secondary | ICD-10-CM

## 2014-03-05 MED ORDER — LORCASERIN HCL 10 MG PO TABS: ORAL_TABLET | ORAL | Status: DC

## 2014-03-06 ENCOUNTER — Other Ambulatory Visit: Payer: Self-pay | Admitting: Family Medicine

## 2014-03-07 LAB — HEMOGLOBIN A1C
Hgb A1c MFr Bld: 5.6 % (ref <5.7)
Mean Plasma Glucose: 114 mg/dL (ref <117)

## 2014-03-07 LAB — TSH: TSH: 1.128 u[IU]/mL (ref 0.350–4.500)

## 2014-03-07 LAB — INSULIN, FASTING: Insulin fasting, serum: 28 u[IU]/mL (ref 3–28)

## 2014-03-08 ENCOUNTER — Encounter: Payer: Self-pay | Admitting: Nurse Practitioner

## 2014-03-08 NOTE — Progress Notes (Signed)
Subjective:  Doing well on Belviq. Regular exercise. Working on diet. Seeing a chiropractor for her back which has improved. Off Celebrex. No CP or SOB.  Objective:   BP 122/82  Ht 5\' 7"  (1.702 m)  Wt 184 lb 9.6 oz (83.734 kg)  BMI 28.91 kg/m2  LMP 10/13/2011 NAD. Alert, oriented. Cheerful affect. Lungs clear. Heart RRR.  Assessment:  Problem List Items Addressed This Visit     Other   Morbid obesity   Relevant Medications      Lorcaserin HCl (BELVIQ) 10 MG TABS   Other Relevant Orders      Insulin, fasting (Completed)   Hypertriglyceridemia - Primary   Relevant Orders      Insulin, fasting (Completed)     Plan:  Meds ordered this encounter  Medications  . Lorcaserin HCl (BELVIQ) 10 MG TABS    Sig: TAKE 1 TABLET BY MOUTH TWICE DAILY    Dispense:  60 tablet    Refill:  2    Please dispense name brand    Order Specific Question:  Supervising Provider    Answer:  Mikey Kirschner [[id number]]   Continue current regimen. Fasting insulin added to ordered labs due to family history.  Return in about 3 months (around 06/05/2014).

## 2014-03-10 LAB — HEPATIC FUNCTION PANEL
ALT: 13 U/L (ref 0–35)
AST: 15 U/L (ref 0–37)
Albumin: 4.6 g/dL (ref 3.5–5.2)
Alkaline Phosphatase: 48 U/L (ref 39–117)
Bilirubin, Direct: 0.1 mg/dL (ref 0.0–0.3)
Indirect Bilirubin: 0.3 mg/dL (ref 0.2–1.2)
Total Bilirubin: 0.4 mg/dL (ref 0.2–1.2)
Total Protein: 6.8 g/dL (ref 6.0–8.3)

## 2014-03-10 LAB — LIPID PANEL
Cholesterol: 200 mg/dL (ref 0–200)
HDL: 45 mg/dL (ref >39)
LDL Cholesterol: 116 mg/dL — ABNORMAL HIGH (ref 0–99)
Total CHOL/HDL Ratio: 4.4 Ratio
Triglycerides: 197 mg/dL — ABNORMAL HIGH (ref <150)
VLDL: 39 mg/dL (ref 0–40)

## 2014-03-14 ENCOUNTER — Telehealth: Payer: Self-pay | Admitting: Family Medicine

## 2014-03-14 NOTE — Telephone Encounter (Signed)
Faxed form for prior auth request for pt's BELVIQ, await decision

## 2014-03-18 NOTE — Telephone Encounter (Signed)
Rx prior APPROVED for pt's BELVIQ, expires 03/14/15 through [facility], faxed approval to [facility]

## 2014-04-07 ENCOUNTER — Other Ambulatory Visit: Payer: Self-pay | Admitting: Nurse Practitioner

## 2014-06-03 ENCOUNTER — Ambulatory Visit: Payer: BC Managed Care – PPO | Admitting: Nurse Practitioner

## 2014-06-19 ENCOUNTER — Encounter: Payer: Self-pay | Admitting: Nurse Practitioner

## 2014-06-19 ENCOUNTER — Ambulatory Visit (INDEPENDENT_AMBULATORY_CARE_PROVIDER_SITE_OTHER): Payer: BC Managed Care – PPO | Admitting: Nurse Practitioner

## 2014-06-19 VITALS — BP 122/80 | Ht 67.0 in | Wt 186.0 lb

## 2014-06-19 DIAGNOSIS — E781 Pure hyperglyceridemia: Secondary | ICD-10-CM

## 2014-06-19 MED ORDER — PHENTERMINE HCL 37.5 MG PO TABS: 37.5000 mg | ORAL_TABLET | Freq: Every day | ORAL | Status: DC

## 2014-06-21 ENCOUNTER — Encounter: Payer: Self-pay | Admitting: Nurse Practitioner

## 2014-06-21 NOTE — Progress Notes (Signed)
Subjective:  Presents for routine followup. Has been off Belviq for about 1-1/2 weeks. Has plateaued as far as her weight. Also due to busy schedule and stress, patient is not exercising as much as she did before. Has done fairly well with her diet. No chest pain/ischemic type pain or shortness of breath. Would like to try a different medication to help with weight loss. History of palpitations but none for well over a year. Has taken Qsymia without difficulty.  Objective:   BP 122/80  Ht 5\' 7"  (1.702 m)  Wt 186 lb (84.369 kg)  BMI 29.12 kg/m2  LMP 10/13/2011 NAD. Alert, oriented. Lungs clear. Heart regular rate rhythm. Reviewed lab work with patient done 03/06/14.  Assessment: Hypertriglyceridemia  Morbid obesity  Plan:  Meds ordered this encounter  Medications  . Omega-3 Fatty Acids (EQL OMEGA 3 FISH OIL) 1400 MG CAPS    Sig: Take 2 tablets by mouth daily.  . phentermine (ADIPEX-P) 37.5 MG tablet    Sig: Take 1 tablet (37.5 mg total) by mouth daily before breakfast.    Dispense:  30 tablet    Refill:  2    Order Specific Question:  Supervising Provider    Answer:  Maggie Font   Start with phentermine half tab by mouth daily for several days and if tolerated increase to one by mouth daily. Strongly encouraged patient to restart her exercise program. Return in about 3 months (around 09/18/2014).

## 2014-07-24 ENCOUNTER — Telehealth: Payer: Self-pay | Admitting: Family Medicine

## 2014-07-24 ENCOUNTER — Other Ambulatory Visit: Payer: Self-pay | Admitting: *Deleted

## 2014-07-24 MED ORDER — ALPRAZOLAM 0.25 MG PO TABS: ORAL_TABLET | ORAL | Status: DC

## 2014-07-24 NOTE — Telephone Encounter (Signed)
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  Can we send in Rx for her to [facility]?  Please see paper chart Patient needs it soon, has big presentation to present next week  Please call pt when done

## 2014-07-24 NOTE — Telephone Encounter (Signed)
May Rx this, caition drowsiness

## 2014-07-24 NOTE — Telephone Encounter (Signed)
Script ready for pickup. Pt notified.  

## 2014-07-24 NOTE — Telephone Encounter (Signed)
Pt got xanax 0.25mg  1/2 to 1 BID prn #20 on 09/19/2011

## 2014-08-05 ENCOUNTER — Other Ambulatory Visit: Payer: Self-pay | Admitting: *Deleted

## 2014-08-05 DIAGNOSIS — N281 Cyst of kidney, acquired: Secondary | ICD-10-CM

## 2014-08-11 ENCOUNTER — Encounter: Payer: Self-pay | Admitting: Nurse Practitioner

## 2014-08-12 ENCOUNTER — Other Ambulatory Visit: Payer: Self-pay | Admitting: *Deleted

## 2014-08-12 MED ORDER — PANTOPRAZOLE SODIUM 40 MG PO TBEC: DELAYED_RELEASE_TABLET | ORAL | Status: DC

## 2014-08-14 ENCOUNTER — Ambulatory Visit (HOSPITAL_COMMUNITY): Payer: BC Managed Care – PPO

## 2014-08-15 ENCOUNTER — Ambulatory Visit (HOSPITAL_COMMUNITY)
Admission: RE | Admit: 2014-08-15 | Discharge: 2014-08-15 | Disposition: A | Discharge status: Home / Self Care | Payer: BC Managed Care – PPO | Source: Ambulatory Visit | Attending: Family Medicine | Admitting: Family Medicine

## 2014-08-15 DIAGNOSIS — N281 Cyst of kidney, acquired: Secondary | ICD-10-CM | POA: Insufficient documentation

## 2014-08-15 DIAGNOSIS — Z09 Encounter for follow-up examination after completed treatment for conditions other than malignant neoplasm: Secondary | ICD-10-CM | POA: Insufficient documentation

## 2014-08-19 NOTE — Progress Notes (Signed)
Patient notified and verbalized understanding of the test results. No further questions. 

## 2014-08-25 ENCOUNTER — Encounter: Payer: Self-pay | Admitting: Family Medicine

## 2014-08-25 ENCOUNTER — Other Ambulatory Visit: Payer: Self-pay

## 2014-08-25 ENCOUNTER — Ambulatory Visit (INDEPENDENT_AMBULATORY_CARE_PROVIDER_SITE_OTHER): Payer: BC Managed Care – PPO | Admitting: Family Medicine

## 2014-08-25 VITALS — BP 130/72 | Temp 98.8°F | Ht 67.0 in | Wt 189.0 lb

## 2014-08-25 DIAGNOSIS — J208 Acute bronchitis due to other specified organisms: Secondary | ICD-10-CM

## 2014-08-25 DIAGNOSIS — J0100 Acute maxillary sinusitis, unspecified: Secondary | ICD-10-CM

## 2014-08-25 MED ORDER — CELECOXIB 200 MG PO CAPS: 400.0000 mg | ORAL_CAPSULE | Freq: Every day | ORAL | Status: DC

## 2014-08-25 MED ORDER — LEVOFLOXACIN 500 MG PO TABS: 500.0000 mg | ORAL_TABLET | Freq: Every day | ORAL | Status: DC

## 2014-08-25 NOTE — Telephone Encounter (Signed)
Last seen today.

## 2014-08-25 NOTE — Progress Notes (Signed)
   Subjective:    Patient ID: Nancy Robertson, female    DOB: 22-Jun-1966, 48 y.o.   MRN: [id number]  Cough This is a new problem. Episode onset: Friday. The problem has been gradually worsening. The problem occurs every few minutes. The cough is productive of sputum. Associated symptoms include a fever, headaches and rhinorrhea. Pertinent negatives include no chest pain, ear pain, shortness of breath or wheezing. She has tried OTC cough suppressant and steroid inhaler for the symptoms. The treatment provided no relief. Her past medical history is significant for asthma.   Please see above/PMH asthma   Review of Systems  Constitutional: Positive for fever. Negative for activity change.  HENT: Positive for congestion and rhinorrhea. Negative for ear pain.   Eyes: Negative for discharge.  Respiratory: Positive for cough. Negative for shortness of breath and wheezing.   Cardiovascular: Negative for chest pain.  Neurological: Positive for headaches.       Objective:   Physical Exam  Constitutional: She appears well-developed.  HENT:  Head: Normocephalic.  Nose: Nose normal.  Mouth/Throat: Oropharynx is clear and moist. No oropharyngeal exudate.  Neck: Neck supple.  Cardiovascular: Normal rate and normal heart sounds.   No murmur heard. Pulmonary/Chest: Effort normal. She has wheezes.  Lymphadenopathy:    She has no cervical adenopathy.  Skin: Skin is warm and dry.  Nursing note and vitals reviewed.         Assessment & Plan:  Progressive coughing related into acute bronchitis antibiotics along with prednisone and inhaler prescribed warning signs discussed follow-up if ongoing troubles. No need for any x-rays or lab work currently

## 2014-08-27 ENCOUNTER — Other Ambulatory Visit: Payer: Self-pay

## 2014-08-27 DIAGNOSIS — Z1231 Encounter for screening mammogram for malignant neoplasm of breast: Secondary | ICD-10-CM

## 2014-08-28 ENCOUNTER — Ambulatory Visit (INDEPENDENT_AMBULATORY_CARE_PROVIDER_SITE_OTHER): Payer: BC Managed Care – PPO | Admitting: Internal Medicine

## 2014-08-28 ENCOUNTER — Encounter (INDEPENDENT_AMBULATORY_CARE_PROVIDER_SITE_OTHER): Payer: Self-pay | Admitting: Internal Medicine

## 2014-08-28 VITALS — BP 108/58 | HR 76 | Temp 98.0°F | Ht 67.0 in | Wt 189.6 lb

## 2014-08-28 DIAGNOSIS — K625 Hemorrhage of anus and rectum: Secondary | ICD-10-CM

## 2014-08-28 DIAGNOSIS — R1032 Left lower quadrant pain: Secondary | ICD-10-CM

## 2014-08-28 NOTE — Patient Instructions (Signed)
Ct abdomen/pelvis with CM.  

## 2014-08-28 NOTE — Progress Notes (Addendum)
Subjective:    Patient ID: Nancy Robertson, female    DOB: 1965-11-13, 48 y.o.   MRN: [id number]  HPI Presents today with c/o rectal bleeding. It happens frequently. She has constipation. She says she has internal hemorrhoids. Symptoms x 5 months.  She drinks a lot of water. She has left lower sided tenderness. The pain is not present today. Last episode of rectal bleeding was 1 week ago. She describes as bright red in color. She describes as mucous. She usually sees the bleeding when she is constipated. She says her stools are very hard. She never feels like she is empty. Hx of ischemic colitis. The pain she has is not like the pain she had when she had ischemic colitis.  Married. Two children. Works and [employer].  Appetite has been good.    12/07/2012 CT abdomen/pelvis with CM:  IMPRESSION: Thickened segment of the descending colon with mild pericolonic fat stranding. This is in keeping with a nonspecific colitis (infectious, ischemic, and inflammatory considerations). Recommend colonoscopy follow-up.    12/02/2010 Colonoscopy: Normal colonoscopy Review of Systems   Past Medical History  Diagnosis Date  . GERD (gastroesophageal reflux disease)   . Asthma     seasonal only  . Palpitations 04/17/2012  . Chest pain 04/17/2012  . Mixed hyperlipidemia 04/17/2012  . 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 Allergy     Past Surgical History  Procedure Laterality Date  . Knee surgery  2007    right  . Bladder repair  2010  . Vaginal hysterectomy  11/10/2011    Procedure: HYSTERECTOMY VAGINAL;  Surgeon: Delice Lesch, MD;  Location: [facility];  Service: Gynecology;  Laterality: N/A;  Total Vaginal Hysterectomy, bilateral salpingo-oopherectomy, cystoscopy  . Salpingoophorectomy  11/10/2011    Procedure: SALPINGO OOPHERECTOMY;  Surgeon: Delice Lesch, MD;  Location: [facility];  Service: Gynecology;  Laterality: Bilateral;  . Cystoscopy  11/10/2011    Procedure:  CYSTOSCOPY;  Surgeon: Delice Lesch, MD;  Location: [facility];  Service: Gynecology;  Laterality: N/A;  . Abdominal hysterectomy      Allergies  Allergen Reactions  . Azithromycin Nausea Only  . Erythromycin Nausea And Vomiting  . Qsymia [Phentermine-Topiramate] Other (See Comments)    Fuzzy thinking  . Penicillins     Reaction from Childhood.    Current Outpatient Prescriptions on File Prior to Visit  Medication Sig Dispense Refill  . ALPRAZolam (XANAX) 0.25 MG tablet Take one half to one BID prn. Caution drowsiness 20 tablet 0  . celecoxib (CELEBREX) 200 MG capsule Take 2 capsules (400 mg total) by mouth daily. (Patient taking differently: Take 400 mg by mouth as needed. ) 60 capsule 6  . Coenzyme Q10 (CO Q 10 PO) Take by mouth.    . fluticasone (FLONASE) 50 MCG/ACT nasal spray Place 2 sprays into both nostrils daily. (Patient taking differently: Place 2 sprays into both nostrils as needed. ) 16 g 5  . levofloxacin (LEVAQUIN) 500 MG tablet Take 1 tablet (500 mg total) by mouth daily. 10 tablet 0  . Omega-3 Fatty Acids (EQL OMEGA 3 FISH OIL) 1400 MG CAPS Take 2 tablets by mouth daily.    . pantoprazole (PROTONIX) 40 MG tablet TAKE 1 TABLET BY MOUTH EVERY MORNING 30 tablet 1  . pravastatin (PRAVACHOL) 10 MG tablet TAKE 1 TABLET BY MOUTH DAILY FOR CHOLESTEROL 30 tablet 5   No current facility-administered medications on file prior to visit.  Objective:   Physical Exam  Filed Vitals:   08/28/14 1450  Height: 5\' 7"  (1.702 m)  Weight: 189 lb 9.6 oz (86.002 kg)  Alert and oriented. Skin warm and dry. Oral mucosa is moist.   . Sclera anicteric, conjunctivae is pink. Thyroid not enlarged. No cervical lymphadenopathy. Lungs clear. Heart regular rate and rhythm.  Abdomen is soft. Bowel sounds are positive. No hepatomegaly. No abdominal masses felt. No tenderness.  No edema to lower extremities.           Assessment & Plan:  Rectal bleeding. Hx of ischemic colitis. Hx of  constipation. Suspect rectal bleeding related to her constipation.  Stool softener BID CBC with diff today

## 2014-08-29 LAB — CBC WITH DIFFERENTIAL/PLATELET
Basophils Absolute: 0 10*3/uL (ref 0.0–0.1)
Basophils Relative: 0 % (ref 0–1)
Eosinophils Absolute: 0.2 10*3/uL (ref 0.0–0.7)
Eosinophils Relative: 3 % (ref 0–5)
HCT: 38.2 % (ref 36.0–46.0)
Hemoglobin: 13 g/dL (ref 12.0–15.0)
Lymphocytes Relative: 44 % (ref 12–46)
Lymphs Abs: 2.5 10*3/uL (ref 0.7–4.0)
MCH: 29.8 pg (ref 26.0–34.0)
MCHC: 34 g/dL (ref 30.0–36.0)
MCV: 87.6 fL (ref 78.0–100.0)
MPV: 9.9 fL (ref 9.4–12.4)
Monocytes Absolute: 0.3 10*3/uL (ref 0.1–1.0)
Monocytes Relative: 6 % (ref 3–12)
Neutro Abs: 2.7 10*3/uL (ref 1.7–7.7)
Neutrophils Relative %: 47 % (ref 43–77)
Platelets: 244 10*3/uL (ref 150–400)
RBC: 4.36 MIL/uL (ref 3.87–5.11)
RDW: 13 % (ref 11.5–15.5)
WBC: 5.7 10*3/uL (ref 4.0–10.5)

## 2014-09-01 ENCOUNTER — Ambulatory Visit
Admission: RE | Admit: 2014-09-01 | Discharge: 2014-09-01 | Disposition: A | Discharge status: Home / Self Care | Payer: BC Managed Care – PPO | Source: Ambulatory Visit

## 2014-09-01 DIAGNOSIS — Z1231 Encounter for screening mammogram for malignant neoplasm of breast: Secondary | ICD-10-CM

## 2014-09-03 ENCOUNTER — Telehealth (INDEPENDENT_AMBULATORY_CARE_PROVIDER_SITE_OTHER): Payer: Self-pay | Admitting: Internal Medicine

## 2014-09-03 ENCOUNTER — Ambulatory Visit (HOSPITAL_COMMUNITY)
Admission: RE | Admit: 2014-09-03 | Discharge: 2014-09-03 | Disposition: A | Discharge status: Home / Self Care | Payer: BC Managed Care – PPO | Source: Ambulatory Visit | Attending: Internal Medicine | Admitting: Internal Medicine

## 2014-09-03 DIAGNOSIS — K625 Hemorrhage of anus and rectum: Secondary | ICD-10-CM | POA: Diagnosis not present

## 2014-09-03 DIAGNOSIS — Z8719 Personal history of other diseases of the digestive system: Secondary | ICD-10-CM | POA: Insufficient documentation

## 2014-09-03 DIAGNOSIS — K760 Fatty (change of) liver, not elsewhere classified: Secondary | ICD-10-CM | POA: Insufficient documentation

## 2014-09-03 DIAGNOSIS — K7689 Other specified diseases of liver: Secondary | ICD-10-CM | POA: Diagnosis not present

## 2014-09-03 DIAGNOSIS — Q438 Other specified congenital malformations of intestine: Secondary | ICD-10-CM | POA: Insufficient documentation

## 2014-09-03 DIAGNOSIS — R1032 Left lower quadrant pain: Secondary | ICD-10-CM | POA: Insufficient documentation

## 2014-09-03 MED ORDER — IOHEXOL 300 MG/ML  SOLN
100.0000 mL | Freq: Once | INTRAMUSCULAR | Status: AC | PRN
  Administered 2014-09-03: 100 mL via INTRAVENOUS

## 2014-09-03 MED ORDER — HYDROCORTISONE ACE-PRAMOXINE 1-1 % RE FOAM: 1.0000 | Freq: Two times a day (BID) | RECTAL | Status: DC

## 2014-09-03 NOTE — Telephone Encounter (Signed)
Intermittent rectal bleeding. Will eprescirbe Proctofoam

## 2014-09-08 ENCOUNTER — Other Ambulatory Visit: Payer: Self-pay | Admitting: Family Medicine

## 2014-09-15 ENCOUNTER — Ambulatory Visit: Payer: BC Managed Care – PPO | Admitting: Nurse Practitioner

## 2014-09-15 DIAGNOSIS — Z029 Encounter for administrative examinations, unspecified: Secondary | ICD-10-CM

## 2014-09-19 ENCOUNTER — Other Ambulatory Visit (INDEPENDENT_AMBULATORY_CARE_PROVIDER_SITE_OTHER): Payer: Self-pay | Admitting: Internal Medicine

## 2014-09-25 ENCOUNTER — Telehealth (INDEPENDENT_AMBULATORY_CARE_PROVIDER_SITE_OTHER): Payer: Self-pay | Admitting: Internal Medicine

## 2014-09-25 ENCOUNTER — Other Ambulatory Visit (INDEPENDENT_AMBULATORY_CARE_PROVIDER_SITE_OTHER): Payer: Self-pay | Admitting: Internal Medicine

## 2014-09-25 DIAGNOSIS — K6289 Other specified diseases of anus and rectum: Secondary | ICD-10-CM

## 2014-09-25 MED ORDER — HYDROCORTISONE ACE-PRAMOXINE 1-1 % RE FOAM: RECTAL | Status: DC

## 2014-09-25 NOTE — Telephone Encounter (Signed)
She is using stool softener x 2. No rectal bleeding x 3 days. She says last week she had rectal bleeding. The proctofoam is helping.  She will have the rectal bleeding with constipation.   I am going to try her on Linzess. Stop the herbal tea.

## 2014-09-25 NOTE — Telephone Encounter (Signed)
Rx eprescribed 

## 2014-10-05 ENCOUNTER — Other Ambulatory Visit: Payer: Self-pay | Admitting: Family Medicine

## 2014-10-08 ENCOUNTER — Ambulatory Visit: Payer: BC Managed Care – PPO | Admitting: *Deleted

## 2014-10-08 ENCOUNTER — Ambulatory Visit (INDEPENDENT_AMBULATORY_CARE_PROVIDER_SITE_OTHER): Payer: BC Managed Care – PPO | Admitting: *Deleted

## 2014-10-08 DIAGNOSIS — Z23 Encounter for immunization: Secondary | ICD-10-CM

## 2014-10-19 ENCOUNTER — Other Ambulatory Visit: Payer: Self-pay | Admitting: Family Medicine

## 2014-10-24 ENCOUNTER — Encounter: Payer: Self-pay | Admitting: Nurse Practitioner

## 2014-10-24 ENCOUNTER — Ambulatory Visit (INDEPENDENT_AMBULATORY_CARE_PROVIDER_SITE_OTHER): Payer: BLUE CROSS/BLUE SHIELD | Admitting: Nurse Practitioner

## 2014-10-24 DIAGNOSIS — E782 Mixed hyperlipidemia: Secondary | ICD-10-CM

## 2014-10-24 DIAGNOSIS — G8929 Other chronic pain: Secondary | ICD-10-CM

## 2014-10-24 DIAGNOSIS — M545 Low back pain: Secondary | ICD-10-CM

## 2014-10-24 DIAGNOSIS — K219 Gastro-esophageal reflux disease without esophagitis: Secondary | ICD-10-CM

## 2014-10-24 DIAGNOSIS — IMO0001 Reserved for inherently not codable concepts without codable children: Secondary | ICD-10-CM

## 2014-10-24 MED ORDER — PRAVASTATIN SODIUM 10 MG PO TABS: ORAL_TABLET | ORAL | Status: DC

## 2014-10-24 MED ORDER — PANTOPRAZOLE SODIUM 40 MG PO TBEC: DELAYED_RELEASE_TABLET | ORAL | Status: DC

## 2014-10-24 NOTE — Patient Instructions (Addendum)
[employer] Activia yogurt 2 cups per day OR Align

## 2014-10-26 ENCOUNTER — Encounter: Payer: Self-pay | Admitting: Nurse Practitioner

## 2014-10-26 NOTE — Progress Notes (Signed)
Subjective:  Presents for routine follow up. Has started weight watchers. Regular exercise. Looking at ways to reduce stress. Reflux stable on Protonix. Compliant with cholesterol medication. No CP/ischemic type pain or SOB.  Objective:   BP 126/88 mmHg  Temp(Src) 98.3 F (36.8 C)  Ht 5\' 7"  (1.702 m)  Wt 194 lb (87.998 kg)  BMI 30.38 kg/m2  LMP 10/13/2011 NAD. Alert, oriented. Lungs clear. Heart RRR. Abdomen soft, non distended non tender.  Assessment:  Problem List Items Addressed This Visit      Other   Reflux   Mixed hyperlipidemia   Relevant Medications   pravastatin (PRAVACHOL) tablet   Chronic low back pain   Morbid obesity - Primary       Plan:  Meds ordered this encounter  Medications  . pantoprazole (PROTONIX) 40 MG tablet    Sig: TAKE 1 TABLET BY MOUTH EVERY DAY IN THE MORNING    Dispense:  90 tablet    Refill:  1    Needs office visit    Order Specific Question:  Supervising Provider    Answer:  Mikey Kirschner [[id number]]  . pravastatin (PRAVACHOL) 10 MG tablet    Sig: TAKE 1 TABLET BY MOUTH EVERY DAY FOR CHOLESTEROL    Dispense:  90 tablet    Refill:  1    Order Specific Question:  Supervising Provider    Answer:  Mikey Kirschner [[id number]]   Labs planned in the near future. Continue weight loss efforts. Return in about 6 months (around 04/24/2015).

## 2014-10-27 ENCOUNTER — Other Ambulatory Visit: Payer: Self-pay | Admitting: Nurse Practitioner

## 2014-10-27 DIAGNOSIS — Z79899 Other long term (current) drug therapy: Secondary | ICD-10-CM

## 2014-10-27 DIAGNOSIS — E781 Pure hyperglyceridemia: Secondary | ICD-10-CM

## 2014-10-27 DIAGNOSIS — E782 Mixed hyperlipidemia: Secondary | ICD-10-CM

## 2014-10-27 NOTE — Progress Notes (Signed)
Pt notified of bloodwork orders.

## 2014-11-29 LAB — LIPID PANEL
Cholesterol: 217 mg/dL — ABNORMAL HIGH (ref 0–200)
HDL: 52 mg/dL (ref >39)
LDL Cholesterol: 138 mg/dL — ABNORMAL HIGH (ref 0–99)
Total CHOL/HDL Ratio: 4.2 Ratio
Triglycerides: 133 mg/dL (ref <150)
VLDL: 27 mg/dL (ref 0–40)

## 2014-11-29 LAB — HEPATIC FUNCTION PANEL
ALT: 21 U/L (ref 0–35)
AST: 18 U/L (ref 0–37)
Albumin: 4.3 g/dL (ref 3.5–5.2)
Alkaline Phosphatase: 56 U/L (ref 39–117)
Bilirubin, Direct: <0.1 mg/dL (ref 0.0–0.3)
Total Bilirubin: 0.3 mg/dL (ref 0.2–1.2)
Total Protein: 6.6 g/dL (ref 6.0–8.3)

## 2015-01-26 ENCOUNTER — Other Ambulatory Visit: Payer: Self-pay | Admitting: Family Medicine

## 2015-01-28 ENCOUNTER — Other Ambulatory Visit: Payer: Self-pay | Admitting: Obstetrics and Gynecology

## 2015-04-24 ENCOUNTER — Ambulatory Visit (INDEPENDENT_AMBULATORY_CARE_PROVIDER_SITE_OTHER): Payer: Self-pay | Admitting: Nurse Practitioner

## 2015-04-24 VITALS — BP 122/80 | Ht 67.0 in | Wt 203.0 lb

## 2015-04-24 DIAGNOSIS — IMO0001 Reserved for inherently not codable concepts without codable children: Secondary | ICD-10-CM

## 2015-04-24 DIAGNOSIS — K219 Gastro-esophageal reflux disease without esophagitis: Secondary | ICD-10-CM

## 2015-04-24 DIAGNOSIS — R609 Edema, unspecified: Secondary | ICD-10-CM

## 2015-04-24 DIAGNOSIS — E782 Mixed hyperlipidemia: Secondary | ICD-10-CM

## 2015-04-24 MED ORDER — PANTOPRAZOLE SODIUM 40 MG PO TBEC: DELAYED_RELEASE_TABLET | ORAL | Status: DC

## 2015-04-24 MED ORDER — PRAVASTATIN SODIUM 10 MG PO TABS: ORAL_TABLET | ORAL | Status: DC

## 2015-04-24 MED ORDER — HYDROCHLOROTHIAZIDE 25 MG PO TABS: 25.0000 mg | ORAL_TABLET | Freq: Every day | ORAL | Status: DC

## 2015-04-28 ENCOUNTER — Encounter: Payer: Self-pay | Admitting: Nurse Practitioner

## 2015-04-28 NOTE — Progress Notes (Signed)
Subjective:  Presents for routine follow-up. Continues to gain weight. Stress level has improved. Has not done well with her diet lately. Limited exercise. Has noticed increase swelling in the lower legs/ankle area at times. New job is more sedentary. No chest pain/ischemic type pain or shortness of breath or orthopnea. Reflux stable on Pantoprazole.   Objective:   BP 122/80 mmHg  Ht 5\' 7"  (1.702 m)  Wt 203 lb (92.08 kg)  BMI 31.79 kg/m2  LMP 10/13/2011 NAD. Alert, oriented. Lungs clear. Heart regular rate rhythm. No murmur or gallop noted. Abdomen soft nondistended nontender. Lower extremities trace pitting edema.  Assessment:  Problem List Items Addressed This Visit      Other   Mixed hyperlipidemia   Relevant Medications   pravastatin (PRAVACHOL) 10 MG tablet   hydrochlorothiazide (HYDRODIURIL) 25 MG tablet   Morbid obesity   Reflux    Other Visit Diagnoses    Peripheral edema    -  Primary        Plan:  Meds ordered this encounter  Medications  . Probiotic Product (ALIGN PO)    Sig: Take by mouth daily.  . pravastatin (PRAVACHOL) 10 MG tablet    Sig: TAKE 1 TABLET BY MOUTH EVERY DAY FOR CHOLESTEROL    Dispense:  90 tablet    Refill:  1    Order Specific Question:  Supervising Provider    Answer:  Mikey Kirschner [[id number]]  . pantoprazole (PROTONIX) 40 MG tablet    Sig: TAKE 1 TABLET BY MOUTH EVERY DAY IN THE MORNING    Dispense:  90 tablet    Refill:  1    Order Specific Question:  Supervising Provider    Answer:  Mikey Kirschner [[id number]]  . hydrochlorothiazide (HYDRODIURIL) 25 MG tablet    Sig: Take 1 tablet (25 mg total) by mouth daily.    Dispense:  30 tablet    Refill:  2    Order Specific Question:  Supervising Provider    Answer:  Mikey Kirschner [[id number]]   Low-sodium diet, weight loss and activity recommended. Given information on weight loss medicines. Strongly recommend resuming her exercise regimen. HCTZ as needed for peripheral edema. Return in about  6 months (around 10/25/2015).

## 2015-05-04 ENCOUNTER — Encounter: Payer: Self-pay | Admitting: Nurse Practitioner

## 2015-05-08 ENCOUNTER — Other Ambulatory Visit: Payer: Self-pay | Admitting: Nurse Practitioner

## 2015-05-08 MED ORDER — NALTREXONE-BUPROPION HCL ER 8-90 MG PO TB12: ORAL_TABLET | ORAL | Status: DC

## 2015-05-31 ENCOUNTER — Encounter: Payer: Self-pay | Admitting: Nurse Practitioner

## 2015-06-16 ENCOUNTER — Encounter: Payer: Self-pay | Admitting: Nurse Practitioner

## 2015-06-16 ENCOUNTER — Other Ambulatory Visit: Payer: Self-pay | Admitting: Nurse Practitioner

## 2015-06-16 ENCOUNTER — Telehealth: Payer: Self-pay | Admitting: Nurse Practitioner

## 2015-06-16 MED ORDER — NALTREXONE-BUPROPION HCL ER 8-90 MG PO TB12: ORAL_TABLET | ORAL | Status: DC

## 2015-06-16 NOTE — Telephone Encounter (Signed)
Pt is needing a refill on her contrave pt states that she is completely out and is not suppose to miss a dose.   [facility]

## 2015-06-16 NOTE — Telephone Encounter (Signed)
Already sent in and patient has been notified by my chart.

## 2015-06-24 ENCOUNTER — Telehealth: Payer: Self-pay | Admitting: Family Medicine

## 2015-06-24 NOTE — Telephone Encounter (Signed)
Rx prior auth DENIED for pt's Naltrexone-Bupropion HCl ER (CONTRAVE) 8-90 MG, called & notified pt, pt verbalized understanding

## 2015-09-24 ENCOUNTER — Ambulatory Visit: Payer: BLUE CROSS/BLUE SHIELD | Admitting: Nurse Practitioner

## 2015-11-23 ENCOUNTER — Encounter: Payer: Self-pay | Admitting: Nurse Practitioner

## 2015-11-23 ENCOUNTER — Telehealth: Payer: Self-pay | Admitting: Nurse Practitioner

## 2015-11-23 ENCOUNTER — Ambulatory Visit (INDEPENDENT_AMBULATORY_CARE_PROVIDER_SITE_OTHER): Payer: No Typology Code available for payment source | Admitting: Nurse Practitioner

## 2015-11-23 VITALS — BP 122/80 | Temp 99.7°F | Ht 67.0 in | Wt 194.2 lb

## 2015-11-23 DIAGNOSIS — E782 Mixed hyperlipidemia: Secondary | ICD-10-CM

## 2015-11-23 DIAGNOSIS — E781 Pure hyperglyceridemia: Secondary | ICD-10-CM | POA: Diagnosis not present

## 2015-11-23 DIAGNOSIS — J111 Influenza due to unidentified influenza virus with other respiratory manifestations: Secondary | ICD-10-CM

## 2015-11-23 DIAGNOSIS — R5383 Other fatigue: Secondary | ICD-10-CM | POA: Diagnosis not present

## 2015-11-23 DIAGNOSIS — D171 Benign lipomatous neoplasm of skin and subcutaneous tissue of trunk: Secondary | ICD-10-CM

## 2015-11-23 MED ORDER — PRAVASTATIN SODIUM 10 MG PO TABS: ORAL_TABLET | ORAL | Status: DC

## 2015-11-23 MED ORDER — PANTOPRAZOLE SODIUM 40 MG PO TBEC: DELAYED_RELEASE_TABLET | ORAL | Status: DC

## 2015-11-23 MED ORDER — OSELTAMIVIR PHOSPHATE 75 MG PO CAPS: 75.0000 mg | ORAL_CAPSULE | Freq: Two times a day (BID) | ORAL | Status: DC

## 2015-11-23 NOTE — Telephone Encounter (Signed)
Pt called stating that since being seen this morning her cough has gotten worse. Pt would like a stronger cough medicine.    [facility]

## 2015-11-23 NOTE — Patient Instructions (Signed)

## 2015-11-24 ENCOUNTER — Encounter: Payer: Self-pay | Admitting: Nurse Practitioner

## 2015-11-24 NOTE — Progress Notes (Signed)
Subjective:  Presents for routine follow-up on her cholesterol. When she is well, no chest pain/ischemic type pain or shortness of breath. Compliant with pravastatin. Has struggled with her weight. Unable to take concentrating due to side effects. Also began having a fever yesterday morning max temp 101. Headache, cough and fatigue. Myalgias. Sore throat. Postnasal drainage. Taking fluids well. No vomiting diarrhea or abdominal pain. Also has a cystic area in the mid thoracic area for about 2 years. At first it was very small towards the left side of back. Lately it has gotten larger and is now over the spinal area. Does bother her at times.  Objective:   BP 122/80 mmHg  Temp(Src) 99.7 F (37.6 C) (Oral)  Ht 5\' 7"  (1.702 m)  Wt 194 lb 4 oz (88.111 kg)  BMI 30.42 kg/m2  LMP 10/13/2011 NAD. Alert, oriented. Fatigued in appearance. TMs clear effusion, no erythema. Pharynx clear. Neck supple with mild soft anterior adenopathy. Lungs clear. Frequent nonproductive cough noted. Heart regular rate rhythm. Soft fairly well-defined cystic areas noted over the mid thoracic spine approximately 6.5 x 4.5 cm. Nontender to palpation.  Assessment:  Problem List Items Addressed This Visit      Other   Hypertriglyceridemia - Primary   Relevant Medications   pravastatin (PRAVACHOL) 10 MG tablet   Other Relevant Orders   Lipid panel   Hepatic function panel   Basic metabolic panel   TSH   VITAMIN D 25 Hydroxy (Vit-D Deficiency, Fractures)   Mixed hyperlipidemia   Relevant Medications   pravastatin (PRAVACHOL) 10 MG tablet   Other Relevant Orders   Lipid panel   Hepatic function panel   Basic metabolic panel   TSH   VITAMIN D 25 Hydroxy (Vit-D Deficiency, Fractures)    Other Visit Diagnoses    Influenza        Relevant Medications    oseltamivir (TAMIFLU) 75 MG capsule    Lipoma of back        Relevant Orders    Lipid panel    Hepatic function panel    Basic metabolic panel    TSH    VITAMIN  D 25 Hydroxy (Vit-D Deficiency, Fractures)    Other fatigue        Relevant Orders    TSH    VITAMIN D 25 Hydroxy (Vit-D Deficiency, Fractures)      Plan:  Meds ordered this encounter  Medications  . pantoprazole (PROTONIX) 40 MG tablet    Sig: TAKE 1 TABLET BY MOUTH EVERY DAY IN THE MORNING    Dispense:  90 tablet    Refill:  1    Order Specific Question:  Supervising Provider    Answer:  Mikey Kirschner [[id number]]  . pravastatin (PRAVACHOL) 10 MG tablet    Sig: TAKE 1 TABLET BY MOUTH EVERY DAY FOR CHOLESTEROL    Dispense:  90 tablet    Refill:  1    Order Specific Question:  Supervising Provider    Answer:  Mikey Kirschner [[id number]]  . oseltamivir (TAMIFLU) 75 MG capsule    Sig: Take 1 capsule (75 mg total) by mouth 2 (two) times daily.    Dispense:  10 capsule    Refill:  0    Order Specific Question:  Supervising Provider    Answer:  Mikey Kirschner [[id number]]   Reviewed symptomatic care and warning signs for flu. Obtain lab work once her illness has resolved. Call back by the end of the week if  no improvement, sooner if worse. Encouraged weight loss regular activity and healthy diet. Refer to surgeon for evaluation of probable lipoma. Return in about 6 months (around 05/22/2016) for recheck.

## 2015-11-24 NOTE — Telephone Encounter (Signed)
Attempted to call patient to discuss symptoms. Line picked up but no one answered.

## 2015-11-24 NOTE — Telephone Encounter (Signed)
The only Rx cough med is hydrocodone or something similar which patient cannot take. The best OTC med is dextromethorphan (DM) products or maybe Nyquil for night time.

## 2015-11-24 NOTE — Telephone Encounter (Signed)
[facility] 11/24/15

## 2015-11-24 NOTE — Telephone Encounter (Signed)
Spoke with patient and informed her per Cristie Hem only Rx cough med is hydrocodone or something similar which patient cannot take. The best OTC med is dextromethorphan (DM) products or maybe Nyquil for night time. Patient verbalized understanding and stated that she has tried Delsym, and Robitussin DM otc and they are not working. Stated that the cough is now aggravating her asthma wants to know if you recommend anything else. Please advise?

## 2016-03-22 ENCOUNTER — Encounter (INDEPENDENT_AMBULATORY_CARE_PROVIDER_SITE_OTHER): Payer: Self-pay | Admitting: *Deleted

## 2016-03-31 ENCOUNTER — Other Ambulatory Visit (INDEPENDENT_AMBULATORY_CARE_PROVIDER_SITE_OTHER): Payer: Self-pay | Admitting: *Deleted

## 2016-03-31 ENCOUNTER — Telehealth (INDEPENDENT_AMBULATORY_CARE_PROVIDER_SITE_OTHER): Payer: Self-pay | Admitting: *Deleted

## 2016-03-31 DIAGNOSIS — Z1211 Encounter for screening for malignant neoplasm of colon: Secondary | ICD-10-CM

## 2016-03-31 NOTE — Telephone Encounter (Signed)
Patient last TCS was 11/2010 and before that 2004 (both were normal) -- she was on my recall for TCS at age 50 -- I wasn't aware she had TCS in 2012 when I mailed letter -- tried to explain I needed to check with you to make sure she was due since she just had one in 2012 -- she states you told her 5 years in 2012 -- I have sch'd her for 07/07/16 -- please advise if ok or do we need to put out since she had one in 2012 -- she states her father and brother both had polyps removed on their TCS

## 2016-05-17 ENCOUNTER — Other Ambulatory Visit: Payer: Self-pay | Admitting: Nurse Practitioner

## 2016-05-18 ENCOUNTER — Encounter (INDEPENDENT_AMBULATORY_CARE_PROVIDER_SITE_OTHER): Payer: Self-pay | Admitting: *Deleted

## 2016-05-18 ENCOUNTER — Telehealth (INDEPENDENT_AMBULATORY_CARE_PROVIDER_SITE_OTHER): Payer: Self-pay | Admitting: *Deleted

## 2016-05-18 NOTE — Telephone Encounter (Signed)
Patient needs trilyte 

## 2016-05-20 ENCOUNTER — Encounter: Payer: Self-pay | Admitting: Nurse Practitioner

## 2016-05-20 ENCOUNTER — Ambulatory Visit (INDEPENDENT_AMBULATORY_CARE_PROVIDER_SITE_OTHER): Payer: No Typology Code available for payment source | Admitting: Nurse Practitioner

## 2016-05-20 ENCOUNTER — Encounter: Payer: Self-pay | Admitting: Family Medicine

## 2016-05-20 VITALS — BP 116/74 | Ht 67.0 in | Wt 175.4 lb

## 2016-05-20 DIAGNOSIS — E782 Mixed hyperlipidemia: Secondary | ICD-10-CM | POA: Diagnosis not present

## 2016-05-20 NOTE — Progress Notes (Signed)
Subjective:  Presents for recheck on her cholesterol. Is currently doing a weight loss program through [facility] in [place]. Has lost significant amount of weight. Has a team approach including a dietitian, exercise physiologist in behavioral health. Lab work is done about every 2 weeks. Is due for lab work 8/21 which will include her lipid profile. Sees a gynecologist for her wellness exams. No chest pain/ischemic type pain or shortness of breath.  Objective:   BP 116/74   Ht 5\' 7"  (1.702 m)   Wt 175 lb 6 oz (79.5 kg)   LMP 11/04/2011   BMI 27.47 kg/m  NAD. Alert, oriented. Lungs clear. Heart regular rate rhythm. Has lost over 25 pounds.  Assessment:  Problem List Items Addressed This Visit      Other   Mixed hyperlipidemia - Primary    Other Visit Diagnoses   None.   plan: No changes in regimen at this point, will wait until lipid profile is available to see if any changes need to be made. Recheck here as needed.

## 2016-05-25 MED ORDER — PEG 3350-KCL-NA BICARB-NACL 420 G PO SOLR: 4000.0000 mL | Freq: Once | ORAL | 0 refills | Status: AC

## 2016-05-29 ENCOUNTER — Other Ambulatory Visit: Payer: Self-pay | Admitting: Family Medicine

## 2016-06-06 ENCOUNTER — Telehealth: Payer: Self-pay | Admitting: Nurse Practitioner

## 2016-06-06 ENCOUNTER — Telehealth (INDEPENDENT_AMBULATORY_CARE_PROVIDER_SITE_OTHER): Payer: Self-pay | Admitting: *Deleted

## 2016-06-06 NOTE — Telephone Encounter (Signed)
Patient states she had her BW done at [facility] last week and she said Hoyle Sauer told her to call in and tell her so she could pull it.

## 2016-06-06 NOTE — Telephone Encounter (Signed)
Referring MD/PCP: scott luking   Procedure: tcs  Reason/Indication:  screening  Has patient had this procedure before?  Yes, 2012  If so, when, by whom and where?    Is there a family history of colon cancer?  no  Who?  What age when diagnosed?    Is patient diabetic?   no      Does patient have prosthetic heart valve or mechanical valve?  no  Do you have a pacemaker?  no  Has patient ever had endocarditis? no  Has patient had joint replacement within last 12 months?  no  Does patient tend to be constipated or take laxatives? yes  Does patient have a history of alcohol/drug use?  no  Is patient on Coumadin, Plavix and/or Aspirin? no  Medications: see epic  Allergies: see epic  Medication Adjustment:   Procedure date & time: 07/07/16 at 1030

## 2016-06-07 ENCOUNTER — Telehealth (INDEPENDENT_AMBULATORY_CARE_PROVIDER_SITE_OTHER): Payer: Self-pay | Admitting: *Deleted

## 2016-06-07 NOTE — Telephone Encounter (Signed)
Family history is positive for polyps in brother and father. Proceed with colonoscopy next month as planned.

## 2016-06-07 NOTE — Telephone Encounter (Signed)
Patient last TCS was 11/2010 and before that 2004 (both were normal) -- she was on my recall for TCS at age 50 -- I wasn't aware she had TCS in 2012 when I mailed letter -- tried to explain I needed to check with you to make sure she was due since she just had one in 2012 -- she states you told her 5 years in 2012 -- I have sch'd her for 07/07/16 -- please advise if ok or do we need to put out since she had one in 2012 -- she states her father and brother both had polyps removed on their TCS

## 2016-06-08 NOTE — Telephone Encounter (Signed)
Forwarded to Terri 

## 2016-06-08 NOTE — Telephone Encounter (Signed)
agree

## 2016-06-20 NOTE — Telephone Encounter (Signed)
Notified patient Nancy Robertson reviewed her labs from [facility]. Her cholesterol was done in April; other labs recently; minimal change in lipids since we did them last year. Patient verbalized understanding.

## 2016-06-20 NOTE — Telephone Encounter (Signed)
I reviewed her labs from [facility]. Her cholesterol was done in April; other labs recently; minimal change in lipids since we did them last year.

## 2016-07-07 ENCOUNTER — Ambulatory Visit (HOSPITAL_COMMUNITY)
Admission: RE | Admit: 2016-07-07 | Discharge: 2016-07-07 | Disposition: A | Discharge status: Home / Self Care | Payer: No Typology Code available for payment source | Source: Ambulatory Visit | Attending: Internal Medicine | Admitting: Internal Medicine

## 2016-07-07 ENCOUNTER — Encounter (HOSPITAL_COMMUNITY): Payer: Self-pay | Admitting: *Deleted

## 2016-07-07 ENCOUNTER — Encounter (HOSPITAL_COMMUNITY)
Admission: RE | Disposition: A | Discharge status: Home / Self Care | Payer: Self-pay | Source: Ambulatory Visit | Attending: Internal Medicine

## 2016-07-07 DIAGNOSIS — Z79899 Other long term (current) drug therapy: Secondary | ICD-10-CM | POA: Insufficient documentation

## 2016-07-07 DIAGNOSIS — J45909 Unspecified asthma, uncomplicated: Secondary | ICD-10-CM | POA: Insufficient documentation

## 2016-07-07 DIAGNOSIS — D123 Benign neoplasm of transverse colon: Secondary | ICD-10-CM | POA: Diagnosis not present

## 2016-07-07 DIAGNOSIS — E782 Mixed hyperlipidemia: Secondary | ICD-10-CM | POA: Insufficient documentation

## 2016-07-07 DIAGNOSIS — K644 Residual hemorrhoidal skin tags: Secondary | ICD-10-CM | POA: Diagnosis not present

## 2016-07-07 DIAGNOSIS — K6289 Other specified diseases of anus and rectum: Secondary | ICD-10-CM | POA: Diagnosis not present

## 2016-07-07 DIAGNOSIS — Z8249 Family history of ischemic heart disease and other diseases of the circulatory system: Secondary | ICD-10-CM | POA: Diagnosis not present

## 2016-07-07 DIAGNOSIS — K219 Gastro-esophageal reflux disease without esophagitis: Secondary | ICD-10-CM | POA: Diagnosis not present

## 2016-07-07 DIAGNOSIS — Z1211 Encounter for screening for malignant neoplasm of colon: Secondary | ICD-10-CM | POA: Diagnosis not present

## 2016-07-07 DIAGNOSIS — Z8371 Family history of colonic polyps: Secondary | ICD-10-CM | POA: Insufficient documentation

## 2016-07-07 DIAGNOSIS — D125 Benign neoplasm of sigmoid colon: Secondary | ICD-10-CM | POA: Diagnosis not present

## 2016-07-07 DIAGNOSIS — Z880 Allergy status to penicillin: Secondary | ICD-10-CM | POA: Insufficient documentation

## 2016-07-07 HISTORY — PX: COLONOSCOPY: SHX5424

## 2016-07-07 SURGERY — COLONOSCOPY
Anesthesia: Moderate Sedation

## 2016-07-07 MED ORDER — MEPERIDINE HCL 50 MG/ML IJ SOLN
INTRAMUSCULAR | Status: AC
  Filled 2016-07-07: qty 1

## 2016-07-07 MED ORDER — MIDAZOLAM HCL 5 MG/5ML IJ SOLN
INTRAMUSCULAR | Status: DC | PRN
  Administered 2016-07-07: 3 mg via INTRAVENOUS
  Administered 2016-07-07: 2 mg via INTRAVENOUS
  Administered 2016-07-07: 3 mg via INTRAVENOUS
  Administered 2016-07-07 (×2): 2 mg via INTRAVENOUS

## 2016-07-07 MED ORDER — MEPERIDINE HCL 50 MG/ML IJ SOLN
INTRAMUSCULAR | Status: DC | PRN
  Administered 2016-07-07 (×2): 25 mg via INTRAVENOUS

## 2016-07-07 MED ORDER — MIDAZOLAM HCL 5 MG/5ML IJ SOLN
INTRAMUSCULAR | Status: AC
  Filled 2016-07-07: qty 10

## 2016-07-07 MED ORDER — SODIUM CHLORIDE 0.9 % IV SOLN
INTRAVENOUS | Status: DC
  Administered 2016-07-07: 1000 mL via INTRAVENOUS

## 2016-07-07 MED ORDER — MIDAZOLAM HCL 5 MG/5ML IJ SOLN
INTRAMUSCULAR | Status: AC
  Filled 2016-07-07: qty 5

## 2016-07-07 MED ORDER — STERILE WATER FOR IRRIGATION IR SOLN
Status: DC | PRN
  Administered 2016-07-07: 2.5 mL

## 2016-07-07 NOTE — Op Note (Signed)
[facility] Patient Name: Nancy Robertson Procedure Date: 07/07/2016 10:10 AM MRN: [id number] Date of Birth: [birth year] Attending MD: Hildred Laser , MD CSN: [id number] Age: 50 Admit Type: Outpatient Procedure:                Colonoscopy Indications:              Colon cancer screening in patient at increased                            risk: Family history of colon polyps in multiple                            1st-degree relatives Providers:                Hildred Laser, MD, Lurline Del, RN, Randa Spike,                            Technician Referring MD:             Sallee Lange, MD Medicines:                Meperidine 50 mg IV, Midazolam 12 mg IV Complications:            No immediate complications. Estimated Blood Loss:     Estimated blood loss was minimal. Procedure:                Pre-Anesthesia Assessment:                           - Prior to the procedure, a History and Physical                            was performed, and patient medications and                            allergies were reviewed. The patient's tolerance of                            previous anesthesia was also reviewed. The risks                            and benefits of the procedure and the sedation                            options and risks were discussed with the patient.                            All questions were answered, and informed consent                            was obtained. Prior Anticoagulants: The patient has                            taken no previous anticoagulant or antiplatelet  agents. ASA Grade Assessment: II - A patient with                            mild systemic disease. After reviewing the risks                            and benefits, the patient was deemed in                            satisfactory condition to undergo the procedure.                           After obtaining informed consent, the colonoscope                            was  passed under direct vision. Throughout the                            procedure, the patient's blood pressure, pulse, and                            oxygen saturations were monitored continuously. The                            [id number]) scope was introduced through                            the anus and advanced to the the cecum, identified                            by appendiceal orifice and ileocecal valve. The                            colonoscopy was performed without difficulty. The                            patient tolerated the procedure well. The quality                            of the bowel preparation was adequate. The                            ileocecal valve, appendiceal orifice, and rectum                            were photographed. Scope In: 10:28:46 AM Scope Out: 10:54:12 AM Scope Withdrawal Time: 0 hours 15 minutes 34 seconds  Total Procedure Duration: 0 hours 25 minutes 26 seconds  Findings:      The perianal and digital rectal examinations were normal.      Two sessile polyps were found in the sigmoid colon and splenic flexure.       The polyps were small in size. These polyps were removed with a cold       snare. Resection and retrieval were complete.  The pathology specimen was       placed into Bottle Number 1.      External hemorrhoids were found during retroflexion. The hemorrhoids       were small.      Anal papilla(e) were hypertrophied. Impression:               - Two small polyps in the sigmoid colon and at the                            splenic flexure, removed with a cold snare.                            Resected and retrieved.                           - External hemorrhoids.                           - Single small anal papilla. Moderate Sedation:      Moderate (conscious) sedation was administered by the endoscopy nurse       and supervised by the endoscopist. The following parameters were       monitored: oxygen saturation, heart  rate, blood pressure, CO2       capnography and response to care. Total physician intraservice time was       32 minutes. Recommendation:           - Patient has a contact number available for                            emergencies. The signs and symptoms of potential                            delayed complications were discussed with the                            patient. Return to normal activities tomorrow.                            Written discharge instructions were provided to the                            patient.                           - High fiber diet today.                           - Continue present medications.                           - Await pathology results.                           - Repeat colonoscopy for surveillance based on                            pathology results. Procedure  Code(s):        --- Professional ---                           [phone], Colonoscopy, flexible; with removal of                            tumor(s), polyp(s), or other lesion(s) by snare                            technique                           99152, Moderate sedation services provided by the                            same physician or other qualified health care                            professional performing the diagnostic or                            therapeutic service that the sedation supports,                            requiring the presence of an independent trained                            observer to assist in the monitoring of the                            patient's level of consciousness and physiological                            status; initial 15 minutes of intraservice time,                            patient age 71 years or older                           [phone], Moderate sedation services; each additional                            15 minutes intraservice time Diagnosis Code(s):        --- Professional ---                           Z83.71, Family  history of colonic polyps                           D12.5, Benign neoplasm of sigmoid colon                           D12.3, Benign neoplasm of transverse colon (hepatic  flexure or splenic flexure)                           K62.89, Other specified diseases of anus and rectum                           K64.4, Residual hemorrhoidal skin tags CPT copyright 2016 American Medical Association. All rights reserved. The codes documented in this report are preliminary and upon coder review may  be revised to meet current compliance requirements. Hildred Laser, MD Hildred Laser, MD 07/07/2016 11:05:24 AM This report has been signed electronically. Number of Addenda: 0

## 2016-07-07 NOTE — Discharge Instructions (Signed)
Colonoscopy, Care After These instructions give you information on caring for yourself after your procedure. Your doctor may also give you more specific instructions. Call your doctor if you have any problems or questions after your procedure. HOME CARE  Do not drive for 24 hours.  Do not sign important papers or use machinery for 24 hours.  You may shower.  You may go back to your usual activities, but go slower for the first 24 hours.  Take rest breaks often during the first 24 hours.  Walk around or use warm packs on your belly (abdomen) if you have belly cramping or gas.  Drink enough fluids to keep your pee (urine) clear or pale yellow.  Resume your normal diet. Avoid heavy or fried foods.  Avoid drinking alcohol for 24 hours or as told by your doctor.  Only take medicines as told by your doctor. If a tissue sample (biopsy) was taken during the procedure:   Do not take aspirin or blood thinners for 7 days, or as told by your doctor.  Do not drink alcohol for 7 days, or as told by your doctor.  Eat soft foods for the first 24 hours. GET HELP IF: You still have a small amount of blood in your poop (stool) 2-3 days after the procedure. GET HELP RIGHT AWAY IF:  You have more than a small amount of blood in your poop.  You see clumps of tissue (blood clots) in your poop.  Your belly is puffy (swollen).  You feel sick to your stomach (nauseous) or throw up (vomit).  You have a fever.  You have belly pain that gets worse and medicine does not help. MAKE SURE YOU:  Understand these instructions.  Will watch your condition.  Will get help right away if you are not doing well or get worse.   This information is not intended to replace advice given to you by your health care provider. Make sure you discuss any questions you have with your health care provider.   Document Released: 10/29/2010 Document Revised: 10/01/2013 Document Reviewed: 06/03/2013 Elsevier  Interactive Patient Education 2016 [place] usual medications and high fiber diet. No driving for 24 hours. Physician will call with biopsy results.

## 2016-07-07 NOTE — H&P (Signed)
Nancy Robertson is an 50 y.o. female.   Chief Complaint: Patient is here for colonoscopy. HPI: Patient is 50 year old Caucasian female who is here for screening colonoscopy. She has family history of colonic adenomas. Her father has had multiple polyps removed. Her brother also has had adenomas removed. Last colonoscopy was in February 2012. She has tendency to constipation and is using MiraLAX occasionally. She has intermittent hematochezia felt to be secondary to hemorrhoids. Family history is negative for CRC.  Past Medical History:  Diagnosis Date  . Allergy   . Asthma    seasonal only  . Chest pain 04/17/2012  . GERD (gastroesophageal reflux disease)   . Mixed hyperlipidemia 04/17/2012  . Palpitations 04/17/2012  . TMJ (dislocation of temporomandibular joint) 04/17/2012    Past Surgical History:  Procedure Laterality Date  . ABDOMINAL HYSTERECTOMY    . BLADDER REPAIR  2010  . CYSTOSCOPY  11/10/2011   Procedure: CYSTOSCOPY;  Surgeon: Delice Lesch, MD;  Location: [facility];  Service: Gynecology;  Laterality: N/A;  . KNEE SURGERY  2007   right  . SALPINGOOPHORECTOMY  11/10/2011   Procedure: SALPINGO OOPHERECTOMY;  Surgeon: Delice Lesch, MD;  Location: [facility];  Service: Gynecology;  Laterality: Bilateral;  . VAGINAL HYSTERECTOMY  11/10/2011   Procedure: HYSTERECTOMY VAGINAL;  Surgeon: Delice Lesch, MD;  Location: [facility];  Service: Gynecology;  Laterality: N/A;  Total Vaginal Hysterectomy, bilateral salpingo-oopherectomy, cystoscopy    Family History  Problem Relation Age of Onset  . Hypertension Father   . Diabetes Father   . Hyperlipidemia Father    Social History:  reports that she has never smoked. She has never used smokeless tobacco. She reports that she drinks alcohol. She reports that she does not use drugs.  Allergies:  Allergies  Allergen Reactions  . Azithromycin Nausea Only  . Erythromycin Nausea And Vomiting  . Qsymia [Phentermine-Topiramate] Other (See Comments)     Fuzzy thinking  . Penicillins     Reaction from Childhood.    Medications Prior to Admission  Medication Sig Dispense Refill  . buPROPion (WELLBUTRIN XL) 300 MG 24 hr tablet     . fluticasone (FLONASE) 50 MCG/ACT nasal spray SHAKE WELL AND USE 2 SPRAYS IN EACH NOSTRIL DAILY 16 g 5  . pantoprazole (PROTONIX) 40 MG tablet TAKE 1 TABLET BY MOUTH EVERY DAY IN THE MORNING 90 tablet 1  . pravastatin (PRAVACHOL) 10 MG tablet TAKE 1 TABLET BY MOUTH EVERY DAY FOR CHOLESTEROL 90 tablet 0  . celecoxib (CELEBREX) 200 MG capsule Take 2 capsules (400 mg total) by mouth daily. (Patient not taking: Reported on 11/23/2015) 60 capsule 6  . Coenzyme Q10 (CO Q 10 PO) Take by mouth.    . hydrochlorothiazide (HYDRODIURIL) 25 MG tablet Take 1 tablet (25 mg total) by mouth daily. (Patient not taking: Reported on 07/07/2016) 30 tablet 2  . naltrexone (DEPADE) 50 MG tablet     . Omega-3 Fatty Acids (EQL OMEGA 3 FISH OIL) 1400 MG CAPS Take 2 tablets by mouth daily.    . Probiotic Product (ALIGN PO) Take by mouth daily.      No results found for this or any previous visit (from the past 48 hour(s)). No results found.  ROS  Blood pressure 123/87, pulse 80, temperature 98.1 F (36.7 C), temperature source Oral, resp. rate 11, height 5\' 7"  (1.702 m), weight 171 lb (77.6 kg), last menstrual period 11/04/2011, SpO2 98 %. Physical Exam  Constitutional: She appears well-developed and well-nourished.  HENT:  Mouth/Throat: Oropharynx is clear and moist.  Eyes: Conjunctivae are normal. No scleral icterus.  Neck: No thyromegaly present.  Cardiovascular: Normal rate, regular rhythm and normal heart sounds.   No murmur heard. Respiratory: Effort normal and breath sounds normal.  GI: Soft. She exhibits no distension and no mass. There is no tenderness.  Musculoskeletal: She exhibits no edema.  Lymphadenopathy:    She has no cervical adenopathy.  Neurological: She is alert.  Skin: Skin is warm and dry.      Assessment/Plan High risk screening colonoscopy. History of multiple polyps in father and brother.  Hildred Laser, MD 07/07/2016, 10:17 AM

## 2016-07-13 ENCOUNTER — Encounter (HOSPITAL_COMMUNITY): Payer: Self-pay | Admitting: Internal Medicine

## 2016-11-23 ENCOUNTER — Encounter: Payer: Self-pay | Admitting: Family Medicine

## 2016-11-23 ENCOUNTER — Ambulatory Visit (INDEPENDENT_AMBULATORY_CARE_PROVIDER_SITE_OTHER): Payer: No Typology Code available for payment source | Admitting: Family Medicine

## 2016-11-23 VITALS — BP 118/76 | Temp 98.8°F | Ht 67.0 in | Wt 174.0 lb

## 2016-11-23 DIAGNOSIS — J111 Influenza due to unidentified influenza virus with other respiratory manifestations: Secondary | ICD-10-CM | POA: Diagnosis not present

## 2016-11-23 MED ORDER — OSELTAMIVIR PHOSPHATE 75 MG PO CAPS: 75.0000 mg | ORAL_CAPSULE | Freq: Two times a day (BID) | ORAL | 0 refills | Status: AC

## 2016-11-23 NOTE — Progress Notes (Signed)
   Subjective:    Patient ID: Nancy Robertson, female    DOB: 1965/10/11, 51 y.o.   MRN: [id number]  Sinusitis  This is a new problem. Episode onset: 3 days. Associated symptoms include coughing and headaches. Past treatments include acetaminophen (robitussin).   Cough and sickness in the family 'family also sick  mon started as atickly cough  Bad cough Monday night  vicks used prn helped the cough   Felt deeper last night  Diffuse mod headache  Aches biltat    No nausea no fevr   Flu shot given this yr      Review of Systems  Respiratory: Positive for cough.   Neurological: Positive for headaches.       Objective:   Physical Exam Alert vitals reviewed, moderate malaise. Hydration good. Positive nasal congestion lungs no crackles or wheezes, no tachypnea, intermittent bronchial cough during exam heart regular rate and rhythm.         Assessment & Plan:  He the acuteImpression influenza discussed at length. Petra Kuba of illness and potential sequela discussed. Plan Tamiflu prescribed if indicated and timing appropriate. Symptom care discussed. Warning signs discussed. WSL

## 2016-11-25 ENCOUNTER — Encounter: Payer: Self-pay | Admitting: Family Medicine

## 2016-11-25 ENCOUNTER — Ambulatory Visit (HOSPITAL_COMMUNITY)
Admission: RE | Admit: 2016-11-25 | Discharge: 2016-11-25 | Disposition: A | Discharge status: Home / Self Care | Payer: No Typology Code available for payment source | Source: Ambulatory Visit | Attending: Family Medicine | Admitting: Family Medicine

## 2016-11-25 ENCOUNTER — Ambulatory Visit (INDEPENDENT_AMBULATORY_CARE_PROVIDER_SITE_OTHER): Payer: No Typology Code available for payment source | Admitting: Family Medicine

## 2016-11-25 VITALS — Temp 98.9°F | Ht 67.0 in | Wt 176.9 lb

## 2016-11-25 DIAGNOSIS — J209 Acute bronchitis, unspecified: Secondary | ICD-10-CM | POA: Diagnosis not present

## 2016-11-25 DIAGNOSIS — J111 Influenza due to unidentified influenza virus with other respiratory manifestations: Secondary | ICD-10-CM | POA: Diagnosis not present

## 2016-11-25 DIAGNOSIS — R0781 Pleurodynia: Secondary | ICD-10-CM | POA: Diagnosis not present

## 2016-11-25 MED ORDER — DOXYCYCLINE HYCLATE 100 MG PO CAPS: 100.0000 mg | ORAL_CAPSULE | Freq: Two times a day (BID) | ORAL | 0 refills | Status: DC

## 2016-11-25 MED ORDER — HYDROCODONE-HOMATROPINE 5-1.5 MG/5ML PO SYRP: ORAL_SOLUTION | ORAL | 0 refills | Status: DC

## 2016-11-25 MED ORDER — ALBUTEROL SULFATE HFA 108 (90 BASE) MCG/ACT IN AERS: 2.0000 | INHALATION_SPRAY | Freq: Four times a day (QID) | RESPIRATORY_TRACT | 2 refills | Status: DC | PRN

## 2016-11-25 NOTE — Progress Notes (Signed)
   Subjective:    Patient ID: Nancy Robertson, female    DOB: 11/04/1965, 51 y.o.   MRN: [id number]  HPI  Patient arrives with c/o deep rattling cough- got worse yesterday- diagnosed with flu here Wednesday. Patient recently diagnosed with the flu now with increased coughing chest congestion and some rib pain on the right side when she takes a deep breath she denies wheezing or difficulty breathing denies shortness of breath. Denies nausea vomiting diarrhea. PMH benign. Review of Systems    please see above. Objective:   Physical Exam O2 saturation 96/97 No respiratory distress Bronchial breath sounds noted on the right side no crackles or rails Heart regular Not tachypnea        Assessment & Plan:  Viral syndrome Influenza Secondary bronchitis Doxycycline twice a day for the next 10 days Per patient request on follow-up phone call prescription for cough medication given for home use only cautioned drowsiness Stat chest x-ray did not show pneumonia but did show bronchial changes Patient will follow-up if worse

## 2016-11-30 ENCOUNTER — Encounter: Payer: Self-pay | Admitting: Family Medicine

## 2016-11-30 ENCOUNTER — Ambulatory Visit (INDEPENDENT_AMBULATORY_CARE_PROVIDER_SITE_OTHER): Payer: No Typology Code available for payment source | Admitting: Family Medicine

## 2016-11-30 VITALS — BP 130/80 | Temp 98.1°F | Ht 67.0 in | Wt 177.0 lb

## 2016-11-30 DIAGNOSIS — J4541 Moderate persistent asthma with (acute) exacerbation: Secondary | ICD-10-CM | POA: Diagnosis not present

## 2016-11-30 DIAGNOSIS — R05 Cough: Secondary | ICD-10-CM | POA: Diagnosis not present

## 2016-11-30 DIAGNOSIS — R059 Cough, unspecified: Secondary | ICD-10-CM

## 2016-11-30 MED ORDER — PREDNISONE 20 MG PO TABS: ORAL_TABLET | ORAL | 0 refills | Status: DC

## 2016-11-30 MED ORDER — ALBUTEROL SULFATE (2.5 MG/3ML) 0.083% IN NEBU: 2.5000 mg | INHALATION_SOLUTION | Freq: Four times a day (QID) | RESPIRATORY_TRACT | 5 refills | Status: DC | PRN

## 2016-11-30 MED ORDER — FLUTICASONE PROPIONATE HFA 110 MCG/ACT IN AERO: 2.0000 | INHALATION_SPRAY | Freq: Two times a day (BID) | RESPIRATORY_TRACT | 0 refills | Status: DC

## 2016-11-30 MED ORDER — METHYLPREDNISOLONE ACETATE 40 MG/ML IJ SUSP
40.0000 mg | Freq: Once | INTRAMUSCULAR | Status: AC
  Administered 2016-11-30: 40 mg via INTRAMUSCULAR

## 2016-11-30 NOTE — Progress Notes (Signed)
   Subjective:    Patient ID: Nancy Robertson, female    DOB: 1965-12-31, 51 y.o.   MRN: [id number]  Cough  This is a new problem. The current episode started in the past 7 days. The problem has been unchanged. The cough is non-productive. Associated symptoms include headaches and wheezing. Associated symptoms comments: Chest heaviness. Nothing aggravates the symptoms. Treatments tried: doxycycline. The treatment provided no relief.   Hx of seasonal sthma none in the past lately  Using inhaler quite often   Using doxy  Ongoing challenges of cough and wheezing. Gives history of asthmatic reaction to chest colds in the past.  Has had a lot of difficulty achiness in the chest. Sharp pain at times worse with hard cough or deep breath. Patient states inhaler alone is not controlling cough wheezing and asthmatic reaction. States nebulizer machine has helped in the past See x-ray results  Review of Systems  Respiratory: Positive for cough and wheezing.   Neurological: Positive for headaches.       Objective:   Physical Exam  Alert vital stable patient clearly showing fatigue and malaise from substantial day and night coughing in the past week. HET moderate his congestion lungs bilateral wheezes no tachypnea heart regular in rhythm.      Assessment & Plan:  Impression post flu exacerbation of asthma, moderately severe and persistent. Not bad enough for emergency room that we need to address fairly aggressively plan signing the jaw shot. Initiate nebulizer. Prednisone taper. Add Flovent twice a day after cessation of prednisone follow-up with Dr. Nicki Reaper in 1 week. 25 minutes spent with patient most in discussion of this complicated presentation

## 2016-12-06 ENCOUNTER — Ambulatory Visit: Payer: No Typology Code available for payment source | Admitting: Family Medicine

## 2016-12-06 ENCOUNTER — Encounter: Payer: Self-pay | Admitting: Family Medicine

## 2016-12-06 ENCOUNTER — Ambulatory Visit (INDEPENDENT_AMBULATORY_CARE_PROVIDER_SITE_OTHER): Payer: No Typology Code available for payment source | Admitting: Family Medicine

## 2016-12-06 VITALS — BP 118/78 | Ht 67.0 in | Wt 175.8 lb

## 2016-12-06 DIAGNOSIS — J4541 Moderate persistent asthma with (acute) exacerbation: Secondary | ICD-10-CM | POA: Diagnosis not present

## 2016-12-06 MED ORDER — FLUTICASONE PROPIONATE HFA 110 MCG/ACT IN AERO: 2.0000 | INHALATION_SPRAY | Freq: Two times a day (BID) | RESPIRATORY_TRACT | 5 refills | Status: DC

## 2016-12-06 MED ORDER — ALBUTEROL SULFATE HFA 108 (90 BASE) MCG/ACT IN AERS: 2.0000 | INHALATION_SPRAY | Freq: Four times a day (QID) | RESPIRATORY_TRACT | 5 refills | Status: DC | PRN

## 2016-12-06 NOTE — Progress Notes (Signed)
   Subjective:    Patient ID: Nancy Robertson, female    DOB: 1966/09/25, 51 y.o.   MRN: [id number]  HPI  Patient arrives for follow up on wheezing and cough. Patient states she is feeling much better Patient was seen couple different times in the past few weeks with influenza complications of influenza plus also a flareup of asthma recently she is been doing better able to take a deeper breath able to breathe better move around better she denies any other particular troubles currently Review of Systems Less wheezing intermittent coughing but not severe no fever chills or sweats    Objective:   Physical Exam Lungs clear no crackles heart regular pulse normal HEENT benign       Assessment & Plan:  Reactive airway Asthma flareup Doing better Continue steroid inhaler through the springtime then try tapering off No further x-rays lab work indicated currently

## 2016-12-12 ENCOUNTER — Encounter: Payer: Self-pay | Admitting: Family Medicine

## 2017-02-10 ENCOUNTER — Ambulatory Visit (INDEPENDENT_AMBULATORY_CARE_PROVIDER_SITE_OTHER): Payer: No Typology Code available for payment source | Admitting: Family Medicine

## 2017-02-10 ENCOUNTER — Encounter: Payer: Self-pay | Admitting: Family Medicine

## 2017-02-10 VITALS — BP 116/74 | Temp 99.2°F | Ht 67.0 in | Wt 173.0 lb

## 2017-02-10 DIAGNOSIS — Z131 Encounter for screening for diabetes mellitus: Secondary | ICD-10-CM | POA: Diagnosis not present

## 2017-02-10 DIAGNOSIS — Z79899 Other long term (current) drug therapy: Secondary | ICD-10-CM

## 2017-02-10 DIAGNOSIS — E782 Mixed hyperlipidemia: Secondary | ICD-10-CM

## 2017-02-10 DIAGNOSIS — J019 Acute sinusitis, unspecified: Secondary | ICD-10-CM | POA: Diagnosis not present

## 2017-02-10 MED ORDER — PREDNISONE 20 MG PO TABS: ORAL_TABLET | ORAL | 0 refills | Status: DC

## 2017-02-10 MED ORDER — CEFPROZIL 500 MG PO TABS: 500.0000 mg | ORAL_TABLET | Freq: Two times a day (BID) | ORAL | 0 refills | Status: DC

## 2017-02-10 NOTE — Progress Notes (Signed)
   Subjective:    Patient ID: Nancy Robertson, female    DOB: 09/14/1966, 51 y.o.   MRN: [id number]  Sinusitis  This is a new problem. The current episode started in the past 7 days. Maximum temperature: low grade fever. Associated symptoms include congestion, coughing, ear pain, headaches and a sore throat. (Wheezing ) Treatments tried: saline spray, advil, zyrtec, halls, sudafed, salt water, inhaler, hot tea.   Patient did travel on a plane had a lot of sinus pressure pain discomfort symptoms over the past several days some wheezing no difficulty breathing in the lungs   Review of Systems  HENT: Positive for congestion, ear pain and sore throat.   Respiratory: Positive for cough.   Neurological: Positive for headaches.       Objective:   Physical Exam  Mild sinus tenderness throat normal neck no masses lungs clear heart regular eardrums normal  May use nasal decongestant over the next couple days    Assessment & Plan:  Acute sinusitis Antibiotics prescribed Warning signs discussed Allergy issues Prednisone taper if necessary for reactive airway Follow-up if progressive troubles Patient was seen today for upper respiratory illness. It is felt that the patient is dealing with sinusitis. Antibiotics were prescribed today. Importance of compliance with medication was discussed. Symptoms should gradually resolve over the course of the next several days. If high fevers, progressive illness, difficulty breathing, worsening condition or failure for symptoms to improve over the next several days then the patient is to follow-up. If any emergent conditions the patient is to follow-up in the emergency department otherwise to follow-up in the office.  History hyperlipidemia has been losing weight through await program she came off her cholesterol medicine but then started noticing cholesterol went back up so she restarted it we will recheck lab work within 6 weeks

## 2017-02-16 ENCOUNTER — Encounter: Payer: Self-pay | Admitting: Family Medicine

## 2017-02-16 ENCOUNTER — Ambulatory Visit (INDEPENDENT_AMBULATORY_CARE_PROVIDER_SITE_OTHER): Payer: No Typology Code available for payment source | Admitting: Family Medicine

## 2017-02-16 ENCOUNTER — Telehealth: Payer: Self-pay | Admitting: Family Medicine

## 2017-02-16 VITALS — BP 134/82 | Temp 98.8°F | Ht 67.0 in | Wt 173.0 lb

## 2017-02-16 DIAGNOSIS — J4521 Mild intermittent asthma with (acute) exacerbation: Secondary | ICD-10-CM | POA: Diagnosis not present

## 2017-02-16 MED ORDER — PREDNISONE 20 MG PO TABS: ORAL_TABLET | ORAL | 0 refills | Status: DC

## 2017-02-16 MED ORDER — METHYLPREDNISOLONE ACETATE 40 MG/ML IJ SUSP
40.0000 mg | Freq: Once | INTRAMUSCULAR | Status: AC
  Administered 2017-02-16: 40 mg via INTRAMUSCULAR

## 2017-02-16 MED ORDER — FLUTICASONE PROPIONATE HFA 220 MCG/ACT IN AERO: INHALATION_SPRAY | RESPIRATORY_TRACT | 6 refills | Status: DC

## 2017-02-16 NOTE — Telephone Encounter (Signed)
Patient given Cefzil and Prednisone for sinus infection on 02/10/17

## 2017-02-16 NOTE — Telephone Encounter (Signed)
Patient was seen 5/4 and not feeling any better still coughing,drainage and prednisone not helping that much has flared up her asthma but wondering if she could get a shot to cleared this up like last time.She states has been using her inhaler as instructed in last visit.

## 2017-02-16 NOTE — Telephone Encounter (Signed)
Offered patient an appointment this evening for a recheck with Dr Nicki Reaper but she states she is working in [place] currently and would like an appointment in am with Dr Nicki Reaper for a re check. Appointment scheduled and patient advised to go to ER tonight if worse. Patient verbalized understanding.

## 2017-02-16 NOTE — Progress Notes (Signed)
   Subjective:    Patient ID: Nancy Robertson, female    DOB: 15-Dec-1965, 51 y.o.   MRN: [id number]  Wheezing   This is a new problem. The current episode started 1 to 4 weeks ago. Associated symptoms include coughing. Associated symptoms comments: congestion. Treatments tried: neb treatments, inhaler, cefzil, prednisone, netti pot.   Patient recently treated for the rhinosinusitis had secondary effects now with chest congestion coughing wheezing denies high fever chills sweats states she is bringing up clear phlegm where it was discolored PMH reactive airway   Review of Systems  Respiratory: Positive for cough and wheezing.        Objective:   Physical Exam Bronchial cough noted frequent coughing no crackles no rails HEENT benign heart regular       Assessment & Plan:  Reactive airway flareup related to recent illness Continue prednisone for several more days Depo-Medrol shot Flovent 222 puffs twice a day Albuterol as rescue inhaler If progressive troubles or worse follow-up I do not hear any pneumonia x-rays not indicated Warning signs were discussed in detail

## 2017-02-17 ENCOUNTER — Ambulatory Visit: Payer: No Typology Code available for payment source | Admitting: Family Medicine

## 2017-05-31 ENCOUNTER — Other Ambulatory Visit: Payer: Self-pay | Admitting: Family Medicine

## 2017-06-01 NOTE — Telephone Encounter (Signed)
May have this +3 refills patient needs do lipid liver profile

## 2017-06-01 NOTE — Telephone Encounter (Signed)
Last seen 02/16/17

## 2017-11-27 ENCOUNTER — Other Ambulatory Visit: Payer: Self-pay | Admitting: Family Medicine

## 2017-11-27 MED ORDER — FLUTICASONE PROPIONATE HFA 220 MCG/ACT IN AERO: INHALATION_SPRAY | RESPIRATORY_TRACT | 6 refills | Status: DC

## 2017-11-27 MED ORDER — ALBUTEROL SULFATE HFA 108 (90 BASE) MCG/ACT IN AERS: 2.0000 | INHALATION_SPRAY | Freq: Four times a day (QID) | RESPIRATORY_TRACT | 5 refills | Status: DC | PRN

## 2017-11-28 ENCOUNTER — Other Ambulatory Visit: Payer: Self-pay | Admitting: *Deleted

## 2017-11-28 MED ORDER — ALBUTEROL SULFATE HFA 108 (90 BASE) MCG/ACT IN AERS: 2.0000 | INHALATION_SPRAY | Freq: Four times a day (QID) | RESPIRATORY_TRACT | 5 refills | Status: DC | PRN

## 2017-12-01 ENCOUNTER — Other Ambulatory Visit: Payer: Self-pay | Admitting: Family Medicine

## 2017-12-26 ENCOUNTER — Encounter: Payer: Self-pay | Admitting: Family Medicine

## 2017-12-26 ENCOUNTER — Other Ambulatory Visit: Payer: Self-pay | Admitting: *Deleted

## 2017-12-26 ENCOUNTER — Ambulatory Visit (INDEPENDENT_AMBULATORY_CARE_PROVIDER_SITE_OTHER): Payer: BLUE CROSS/BLUE SHIELD | Admitting: Family Medicine

## 2017-12-26 VITALS — BP 122/82 | Temp 98.4°F | Ht 67.0 in | Wt 180.6 lb

## 2017-12-26 DIAGNOSIS — R062 Wheezing: Secondary | ICD-10-CM

## 2017-12-26 DIAGNOSIS — M2550 Pain in unspecified joint: Secondary | ICD-10-CM

## 2017-12-26 DIAGNOSIS — Z13220 Encounter for screening for lipoid disorders: Secondary | ICD-10-CM

## 2017-12-26 DIAGNOSIS — J45991 Cough variant asthma: Secondary | ICD-10-CM | POA: Diagnosis not present

## 2017-12-26 DIAGNOSIS — Z79899 Other long term (current) drug therapy: Secondary | ICD-10-CM

## 2017-12-26 DIAGNOSIS — R05 Cough: Secondary | ICD-10-CM

## 2017-12-26 MED ORDER — METHYLPREDNISOLONE ACETATE 40 MG/ML IJ SUSP
40.0000 mg | Freq: Once | INTRAMUSCULAR | Status: AC
  Administered 2017-12-26: 40 mg via INTRAMUSCULAR

## 2017-12-26 MED ORDER — PANTOPRAZOLE SODIUM 40 MG PO TBEC: DELAYED_RELEASE_TABLET | ORAL | 1 refills | Status: DC

## 2017-12-26 MED ORDER — PREDNISONE 20 MG PO TABS
ORAL_TABLET | ORAL | 0 refills | Status: DC
Start: 2017-12-26 — End: 2018-02-09

## 2017-12-26 NOTE — Progress Notes (Signed)
   Subjective:    Patient ID: Nancy Robertson, female    DOB: October 08, 1966, 52 y.o.   MRN: [id number]  Asthma  She complains of cough, frequent throat clearing and wheezing. There is no shortness of breath. This is a new problem. The current episode started in the past 7 days. Associated symptoms include headaches, nasal congestion and rhinorrhea. Pertinent negatives include no chest pain, ear pain or fever. Her past medical history is significant for asthma.   Patient with significant cough congestion wheezing tightness in the lungs has difficult time taking a deep breath without cough   Review of Systems  Constitutional: Negative for activity change and fever.  HENT: Positive for congestion and rhinorrhea. Negative for ear pain.   Eyes: Negative for discharge.  Respiratory: Positive for cough and wheezing. Negative for shortness of breath.   Cardiovascular: Negative for chest pain.  Neurological: Positive for headaches.       Objective:   Physical Exam  Constitutional: She appears well-developed.  HENT:  Head: Normocephalic.  Right Ear: External ear normal.  Left Ear: External ear normal.  Nose: Nose normal.  Mouth/Throat: Oropharynx is clear and moist. No oropharyngeal exudate.  Eyes: Right eye exhibits no discharge. Left eye exhibits no discharge.  Neck: Neck supple. No tracheal deviation present.  Cardiovascular: Normal rate and normal heart sounds.  No murmur heard. Pulmonary/Chest: Effort normal and breath sounds normal. She has no wheezes. She has no rales.  Lymphadenopathy:    She has no cervical adenopathy.  Skin: Skin is warm and dry.  Nursing note and vitals reviewed.         Assessment & Plan:  Cough variant asthma Prednisone taper Depo-Medrol shot Albuterol as needed frequently continue steroid inhaler Call if discolored phlegm fevers or worse Warnings discussed  Arthralgias-we will do lab work may need follow-up office visit more than likely  osteoarthritis  Patient will need to do follow-up later this summer for chronic health issues

## 2018-02-09 ENCOUNTER — Encounter: Payer: Self-pay | Admitting: Nurse Practitioner

## 2018-02-09 ENCOUNTER — Ambulatory Visit: Payer: Self-pay | Admitting: Nurse Practitioner

## 2018-02-09 VITALS — BP 112/78 | Temp 98.2°F | Ht 67.0 in | Wt 180.0 lb

## 2018-02-09 DIAGNOSIS — J310 Chronic rhinitis: Secondary | ICD-10-CM

## 2018-02-09 DIAGNOSIS — J45991 Cough variant asthma: Secondary | ICD-10-CM

## 2018-02-09 MED ORDER — METHYLPREDNISOLONE ACETATE 40 MG/ML IJ SUSP
40.0000 mg | Freq: Once | INTRAMUSCULAR | Status: AC
  Administered 2018-02-09: 40 mg via INTRAMUSCULAR

## 2018-02-09 MED ORDER — PREDNISONE 20 MG PO TABS: ORAL_TABLET | ORAL | 0 refills | Status: DC

## 2018-02-09 NOTE — Progress Notes (Signed)
Subjective:  Presents for c/o persistent, frequent non productive cough similar to visit on 3/19 when she was diagnosed with cough variant asthma.  Has not been using her Flovent on a regular basis.  Has had use her nebulizer every night the past week due to wheezing.  Has used her albuterol inhaler one time.  Cough is worse at nighttime.  Mild sore throat and postnasal drainage.  No fever or headache.  Some right ear pressure.  Symptoms had completely resolved after Depo-Medrol injection and oral prednisone in March.  Some sneezing.  Symptoms seem to be triggered by seasonal pollen.  Objective:   BP 112/78   Temp 98.2 F (36.8 C) (Oral)   Ht 5\' 7"  (1.702 m)   Wt 180 lb (81.6 kg)   LMP 11/04/2011   BMI 28.19 kg/m  NAD.  Alert, oriented.  TMs clear effusion bilateral, no erythema.  Pharynx mildly injected with clear PND noted.  Neck supple with mild soft anterior adenopathy.  Lungs clear.  Frequent nonproductive cough noted.  No wheezing or tachypnea.  Heart regular rate and rhythm.  Assessment:  Cough variant asthma - Plan: methylPREDNISolone acetate (DEPO-MEDROL) injection 40 mg  Mixed rhinitis - Plan: methylPREDNISolone acetate (DEPO-MEDROL) injection 40 mg    Plan:   Meds ordered this encounter  Medications  . predniSONE (DELTASONE) 20 MG tablet    Sig: 3 po qd x 3 d then 2 po qd x 3 d then 1 po qd x 2 d    Dispense:  17 tablet    Refill:  0    Order Specific Question:   Supervising Provider    Answer:   Mikey Kirschner [[id number]]  . methylPREDNISolone acetate (DEPO-MEDROL) injection 40 mg   Restart Flovent daily until the end of the spring allergy season.  Rest teeth or inside her mouth afterwards to prevent oral candidiasis.  Explained the difference between her preventive inhaler and rescue inhaler, patient verbalizes understanding.  Call back in 10 to 14 days if symptoms persist, sooner if worse.

## 2018-02-15 DIAGNOSIS — Z1231 Encounter for screening mammogram for malignant neoplasm of breast: Secondary | ICD-10-CM | POA: Diagnosis not present

## 2018-02-15 DIAGNOSIS — Z01419 Encounter for gynecological examination (general) (routine) without abnormal findings: Secondary | ICD-10-CM | POA: Diagnosis not present

## 2018-02-15 DIAGNOSIS — Z6828 Body mass index (BMI) 28.0-28.9, adult: Secondary | ICD-10-CM | POA: Diagnosis not present

## 2018-03-08 DIAGNOSIS — Z13220 Encounter for screening for lipoid disorders: Secondary | ICD-10-CM | POA: Diagnosis not present

## 2018-03-08 DIAGNOSIS — Z79899 Other long term (current) drug therapy: Secondary | ICD-10-CM | POA: Diagnosis not present

## 2018-03-08 DIAGNOSIS — M2550 Pain in unspecified joint: Secondary | ICD-10-CM | POA: Diagnosis not present

## 2018-03-09 LAB — LIPID PANEL
Chol/HDL Ratio: 3.9 ratio (ref 0.0–4.4)
Cholesterol, Total: 224 mg/dL — ABNORMAL HIGH (ref 100–199)
HDL: 58 mg/dL (ref >39)
LDL Calculated: 126 mg/dL — ABNORMAL HIGH (ref 0–99)
Triglycerides: 199 mg/dL — ABNORMAL HIGH (ref 0–149)
VLDL Cholesterol Cal: 40 mg/dL (ref 5–40)

## 2018-03-09 LAB — BASIC METABOLIC PANEL
BUN/Creatinine Ratio: 21 (ref 9–23)
BUN: 16 mg/dL (ref 6–24)
CO2: 26 mmol/L (ref 20–29)
Calcium: 9.6 mg/dL (ref 8.7–10.2)
Chloride: 102 mmol/L (ref 96–106)
Creatinine, Ser: 0.78 mg/dL (ref 0.57–1.00)
GFR calc Af Amer: 100 mL/min/{1.73_m2} (ref >59)
GFR calc non Af Amer: 87 mL/min/{1.73_m2} (ref >59)
Glucose: 90 mg/dL (ref 65–99)
Potassium: 4.4 mmol/L (ref 3.5–5.2)
Sodium: 143 mmol/L (ref 134–144)

## 2018-03-09 LAB — HEPATIC FUNCTION PANEL
ALT: 21 IU/L (ref 0–32)
AST: 18 IU/L (ref 0–40)
Albumin: 4.8 g/dL (ref 3.5–5.5)
Alkaline Phosphatase: 54 IU/L (ref 39–117)
Bilirubin Total: 0.4 mg/dL (ref 0.0–1.2)
Bilirubin, Direct: 0.1 mg/dL (ref 0.00–0.40)
Total Protein: 6.8 g/dL (ref 6.0–8.5)

## 2018-03-09 LAB — SEDIMENTATION RATE: Sed Rate: 2 mm/hr (ref 0–40)

## 2018-03-09 LAB — ANA: Anti Nuclear Antibody(ANA): NEGATIVE

## 2018-03-09 LAB — RHEUMATOID FACTOR: Rhuematoid fact SerPl-aCnc: 10.9 IU/mL (ref 0.0–13.9)

## 2018-03-10 ENCOUNTER — Encounter: Payer: Self-pay | Admitting: Family Medicine

## 2018-04-02 DIAGNOSIS — Z713 Dietary counseling and surveillance: Secondary | ICD-10-CM | POA: Diagnosis not present

## 2018-06-07 DIAGNOSIS — K219 Gastro-esophageal reflux disease without esophagitis: Secondary | ICD-10-CM | POA: Diagnosis not present

## 2018-06-07 DIAGNOSIS — Z713 Dietary counseling and surveillance: Secondary | ICD-10-CM | POA: Diagnosis not present

## 2018-06-07 DIAGNOSIS — Z6829 Body mass index (BMI) 29.0-29.9, adult: Secondary | ICD-10-CM | POA: Diagnosis not present

## 2018-06-07 DIAGNOSIS — E663 Overweight: Secondary | ICD-10-CM | POA: Diagnosis not present

## 2018-06-18 DIAGNOSIS — M25572 Pain in left ankle and joints of left foot: Secondary | ICD-10-CM | POA: Diagnosis not present

## 2018-07-12 DIAGNOSIS — H35711 Central serous chorioretinopathy, right eye: Secondary | ICD-10-CM | POA: Diagnosis not present

## 2018-07-12 DIAGNOSIS — D3132 Benign neoplasm of left choroid: Secondary | ICD-10-CM | POA: Diagnosis not present

## 2018-07-12 DIAGNOSIS — H2513 Age-related nuclear cataract, bilateral: Secondary | ICD-10-CM | POA: Diagnosis not present

## 2018-08-09 DIAGNOSIS — H35711 Central serous chorioretinopathy, right eye: Secondary | ICD-10-CM | POA: Diagnosis not present

## 2018-08-09 DIAGNOSIS — H2513 Age-related nuclear cataract, bilateral: Secondary | ICD-10-CM | POA: Diagnosis not present

## 2018-08-09 DIAGNOSIS — D3132 Benign neoplasm of left choroid: Secondary | ICD-10-CM | POA: Diagnosis not present

## 2018-08-12 ENCOUNTER — Other Ambulatory Visit: Payer: Self-pay | Admitting: Family Medicine

## 2018-08-14 ENCOUNTER — Telehealth: Payer: Self-pay | Admitting: Family Medicine

## 2018-08-14 MED ORDER — OMEPRAZOLE 40 MG PO CPDR: 40.0000 mg | DELAYED_RELEASE_CAPSULE | Freq: Every day | ORAL | 5 refills | Status: DC

## 2018-08-14 MED ORDER — OMEPRAZOLE 20 MG PO CPDR: 20.0000 mg | DELAYED_RELEASE_CAPSULE | Freq: Every day | ORAL | 5 refills | Status: DC

## 2018-08-14 NOTE — Telephone Encounter (Signed)
1.  Please inform patient that her medication is no longer covered/preferred #2 they are recommending omeprazole 40 mg 1 daily #3 this is a suitable medicine #4 if the patient is okay with this we will send a send ask her if she would like a 30 or 90-day (Remember the patient always has the right to consult with her insurer regarding this medication or covering her previous medicine Protonix)

## 2018-08-14 NOTE — Telephone Encounter (Signed)
Patient states she will try the omeprazole and she wanted the 30 days rx incase it does not work for her. Medication sent to the requested pharmacy.

## 2018-08-14 NOTE — Addendum Note (Signed)
Addended by: Karle Barr on: 08/14/2018 01:41 PM   Modules accepted: Orders

## 2018-08-14 NOTE — Addendum Note (Signed)
Addended by: Karle Barr on: 08/14/2018 01:46 PM   Modules accepted: Orders

## 2018-08-14 NOTE — Telephone Encounter (Signed)
Fax from pharmacy stating that Pantoprazole 40 mg tablet (take one tablet by mouth every morning)  is not covered by patient plan. Pharmacy states that the preferred alternative is Omeprazole Capsule. Please advise.

## 2018-10-26 ENCOUNTER — Telehealth: Payer: Self-pay | Admitting: *Deleted

## 2018-10-26 ENCOUNTER — Other Ambulatory Visit (HOSPITAL_COMMUNITY)
Admission: RE | Admit: 2018-10-26 | Discharge: 2018-10-26 | Disposition: A | Discharge status: Home / Self Care | Payer: BLUE CROSS/BLUE SHIELD | Source: Ambulatory Visit | Attending: Family Medicine | Admitting: Family Medicine

## 2018-10-26 ENCOUNTER — Ambulatory Visit (HOSPITAL_COMMUNITY)
Admission: RE | Admit: 2018-10-26 | Discharge: 2018-10-26 | Disposition: A | Discharge status: Home / Self Care | Payer: BLUE CROSS/BLUE SHIELD | Source: Ambulatory Visit | Attending: Family Medicine | Admitting: Family Medicine

## 2018-10-26 ENCOUNTER — Encounter: Payer: Self-pay | Admitting: Family Medicine

## 2018-10-26 ENCOUNTER — Ambulatory Visit (INDEPENDENT_AMBULATORY_CARE_PROVIDER_SITE_OTHER): Payer: BLUE CROSS/BLUE SHIELD | Admitting: Family Medicine

## 2018-10-26 VITALS — BP 118/80 | Temp 98.3°F | Ht 67.0 in | Wt 184.0 lb

## 2018-10-26 DIAGNOSIS — R079 Chest pain, unspecified: Secondary | ICD-10-CM | POA: Diagnosis not present

## 2018-10-26 DIAGNOSIS — R1013 Epigastric pain: Secondary | ICD-10-CM

## 2018-10-26 LAB — BASIC METABOLIC PANEL
Anion gap: 8 (ref 5–15)
BUN: 21 mg/dL — ABNORMAL HIGH (ref 6–20)
CO2: 26 mmol/L (ref 22–32)
Calcium: 9.4 mg/dL (ref 8.9–10.3)
Chloride: 105 mmol/L (ref 98–111)
Creatinine, Ser: 0.67 mg/dL (ref 0.44–1.00)
GFR calc Af Amer: >60 mL/min (ref >60)
GFR calc non Af Amer: >60 mL/min (ref >60)
Glucose, Bld: 94 mg/dL (ref 70–99)
Potassium: 4.2 mmol/L (ref 3.5–5.1)
Sodium: 139 mmol/L (ref 135–145)

## 2018-10-26 LAB — HEPATIC FUNCTION PANEL
ALT: 31 U/L (ref 0–44)
AST: 21 U/L (ref 15–41)
Albumin: 4.4 g/dL (ref 3.5–5.0)
Alkaline Phosphatase: 47 U/L (ref 38–126)
Bilirubin, Direct: <0.1 mg/dL (ref 0.0–0.2)
Total Bilirubin: 0.5 mg/dL (ref 0.3–1.2)
Total Protein: 7.1 g/dL (ref 6.5–8.1)

## 2018-10-26 LAB — CBC WITH DIFFERENTIAL/PLATELET
Abs Immature Granulocytes: 0.02 10*3/uL (ref 0.00–0.07)
Basophils Absolute: 0 10*3/uL (ref 0.0–0.1)
Basophils Relative: 0 %
Eosinophils Absolute: 0.2 10*3/uL (ref 0.0–0.5)
Eosinophils Relative: 3 %
HCT: 40.7 % (ref 36.0–46.0)
Hemoglobin: 13.1 g/dL (ref 12.0–15.0)
Immature Granulocytes: 0 %
Lymphocytes Relative: 40 %
Lymphs Abs: 2.4 10*3/uL (ref 0.7–4.0)
MCH: 29.8 pg (ref 26.0–34.0)
MCHC: 32.2 g/dL (ref 30.0–36.0)
MCV: 92.5 fL (ref 80.0–100.0)
Monocytes Absolute: 0.4 10*3/uL (ref 0.1–1.0)
Monocytes Relative: 6 %
Neutro Abs: 3.1 10*3/uL (ref 1.7–7.7)
Neutrophils Relative %: 51 %
Platelets: 214 10*3/uL (ref 150–400)
RBC: 4.4 MIL/uL (ref 3.87–5.11)
RDW: 12.6 % (ref 11.5–15.5)
WBC: 6.1 10*3/uL (ref 4.0–10.5)
nRBC: 0 % (ref 0.0–0.2)

## 2018-10-26 LAB — TROPONIN I: Troponin I: <0.03 ng/mL (ref <0.03)

## 2018-10-26 LAB — LIPASE, BLOOD: Lipase: 35 U/L (ref 11–51)

## 2018-10-26 MED ORDER — SUCRALFATE 1 GM/10ML PO SUSP: ORAL | 0 refills | Status: DC

## 2018-10-26 MED ORDER — PANTOPRAZOLE SODIUM 40 MG PO TBEC: DELAYED_RELEASE_TABLET | ORAL | 4 refills | Status: DC

## 2018-10-26 NOTE — Progress Notes (Signed)
   Subjective:    Patient ID: Nancy Robertson, female    DOB: 1966-08-29, 53 y.o.   MRN: [id number]  HPIhaving reflux symptoms for the past 3 weeks. Worse last night. Woke from sleep this morning. Had a sharp pain on right side of chest that went through her back and collar bone. Burping does relieve the pain. Pressure in chest that feel like something is stuck. Pt states when she went to bathroom this morning she did feel like she was going to faint. Taking omeprazole 40mg  every day and tried tums. Was taking protonix and it worked well but insurance would not cover.   This patient states she has had some uneasy symptoms over the past few weeks.  It seems to be located couple of times in epigastric region radiating up into the chest like bad indigestion a couple times he has had pain radiate into the right side of the chest denies symptoms that point toward true angina  Has had symptoms at nighttime please see above some of the symptoms do radiate to the right shoulder blade   Review of Systems  Constitutional: Negative for activity change, fatigue and fever.  HENT: Negative for congestion and rhinorrhea.   Respiratory: Negative for cough, chest tightness and shortness of breath.   Cardiovascular: Positive for chest pain. Negative for leg swelling.  Gastrointestinal: Positive for abdominal pain. Negative for blood in stool, constipation and nausea.  Skin: Negative for color change.  Neurological: Negative for dizziness and headaches.  Psychiatric/Behavioral: Negative for agitation and behavioral problems.       Objective:   Physical Exam Vitals signs reviewed.  Constitutional:      General: She is not in acute distress. HENT:     Head: Normocephalic and atraumatic.  Eyes:     General:        Right eye: No discharge.        Left eye: No discharge.  Neck:     Trachea: No tracheal deviation.  Cardiovascular:     Rate and Rhythm: Normal rate and regular rhythm.     Heart sounds:  Normal heart sounds. No murmur.  Pulmonary:     Effort: Pulmonary effort is normal. No respiratory distress.     Breath sounds: Normal breath sounds.  Abdominal:     General: There is no distension.     Palpations: Abdomen is soft. There is no mass.     Tenderness: There is abdominal tenderness.  Lymphadenopathy:     Cervical: No cervical adenopathy.  Skin:    General: Skin is warm and dry.  Neurological:     Mental Status: She is alert.     Coordination: Coordination normal.  Psychiatric:        Behavior: Behavior normal.    Has mild epigastric pain no guarding or rebound no mass felt       Assessment & Plan:  Epigastric pain Radiation into the chest and the right side We will go ahead and do troponin stat Also chest x-ray EKG does not show any acute changes Lab work indicated stat await the results Ultrasound to look at gallbladder May need HIDA may need EGD Add Carafate Also Protonix daily Warning signs discussed I do not feel the patient has had a heart attack

## 2018-10-29 NOTE — Telephone Encounter (Signed)
error 

## 2018-10-31 ENCOUNTER — Ambulatory Visit (HOSPITAL_COMMUNITY)
Admission: RE | Admit: 2018-10-31 | Discharge: 2018-10-31 | Disposition: A | Discharge status: Home / Self Care | Payer: BLUE CROSS/BLUE SHIELD | Source: Ambulatory Visit | Attending: Family Medicine | Admitting: Family Medicine

## 2018-10-31 DIAGNOSIS — R079 Chest pain, unspecified: Secondary | ICD-10-CM | POA: Insufficient documentation

## 2018-10-31 DIAGNOSIS — R1011 Right upper quadrant pain: Secondary | ICD-10-CM | POA: Diagnosis not present

## 2018-10-31 DIAGNOSIS — R1013 Epigastric pain: Secondary | ICD-10-CM | POA: Insufficient documentation

## 2018-11-02 ENCOUNTER — Encounter: Payer: Self-pay | Admitting: Family Medicine

## 2018-11-06 ENCOUNTER — Telehealth: Payer: Self-pay | Admitting: Family Medicine

## 2018-11-06 NOTE — Telephone Encounter (Signed)
Please advise. Thank you

## 2018-11-06 NOTE — Telephone Encounter (Signed)
Patient was taking the Carafate that Dr. Nicki Reaper had prescribe and she said that had really helped and wasn't having any problems.  So she stopped taking on Sunday.  Last night she had her first episode again of pain in her chest area.  It woke her up.  Then today she has started with that pain again in the center of her chest that radiates to the right.  Wondering what she should do?  Start the Carafate again?

## 2018-11-07 ENCOUNTER — Telehealth: Payer: Self-pay | Admitting: Internal Medicine

## 2018-11-07 ENCOUNTER — Other Ambulatory Visit: Payer: Self-pay | Admitting: Family Medicine

## 2018-11-07 ENCOUNTER — Telehealth: Payer: Self-pay | Admitting: Family Medicine

## 2018-11-07 ENCOUNTER — Encounter: Payer: Self-pay | Admitting: Family Medicine

## 2018-11-07 DIAGNOSIS — R079 Chest pain, unspecified: Secondary | ICD-10-CM

## 2018-11-07 DIAGNOSIS — R1013 Epigastric pain: Secondary | ICD-10-CM

## 2018-11-07 MED ORDER — SUCRALFATE 1 GM/10ML PO SUSP: ORAL | 0 refills | Status: DC

## 2018-11-07 NOTE — Telephone Encounter (Signed)
Nurses I did speak with Nancy Robertson regarding her symptoms I have instructed her to resume the Carafate Please put an urgent GI referral.  Reason for the referral is severe epigastric pain and chest spasms She would like to see the same GI doctor with [facility] that saw her husband Nancy Robertson Please also send a refill on Carafate with the note to the pharmacy to state to put the Carafate prescription on file

## 2018-11-07 NOTE — Telephone Encounter (Signed)
Patient is seeing them Next week Feb 6,2020.

## 2018-11-07 NOTE — Telephone Encounter (Signed)
Patient states she seen Dr.Perry in 1998, and the current referral placed to [facility] is on hold due to awaiting Dr.Perry's approval per patient. Advise.

## 2018-11-07 NOTE — Telephone Encounter (Signed)
This patient has no relationship with me or our group.  Since this is urgent, please have her see an advanced practitioner.  She will establish with the supervising physician that day.

## 2018-11-07 NOTE — Telephone Encounter (Signed)
So noted 

## 2018-11-07 NOTE — Telephone Encounter (Signed)
Spoke with patient to see what Dr her husband saw. Pt stated she was not sure. Pt gave me husbands DOB and said that it should be in chart. Looked in chart and it says he say [facility]. Urgent referral placed to GI. Pt states that the office that husband went to is close to [employer]. Refill of Carafate sent in to pharmacy and noted to put on file. Pt verbalized understanding.

## 2018-11-07 NOTE — Telephone Encounter (Signed)
There's an urgent referral for this pt for epigastric pain.    She had previously transferred care to [facility] to Dr. Laural Golden in 2016.  Records are in Epic.    PCP requested pt to be seen at [facility].  Pt informed that Dr. Laural Golden will be retiring.  Will you accept this pt?

## 2018-11-15 ENCOUNTER — Ambulatory Visit (INDEPENDENT_AMBULATORY_CARE_PROVIDER_SITE_OTHER): Payer: BLUE CROSS/BLUE SHIELD | Admitting: Physician Assistant

## 2018-11-15 ENCOUNTER — Encounter: Payer: Self-pay | Admitting: Physician Assistant

## 2018-11-15 VITALS — BP 116/72 | HR 72 | Ht 67.0 in | Wt 186.4 lb

## 2018-11-15 DIAGNOSIS — R142 Eructation: Secondary | ICD-10-CM

## 2018-11-15 DIAGNOSIS — R1013 Epigastric pain: Secondary | ICD-10-CM

## 2018-11-15 MED ORDER — PANTOPRAZOLE SODIUM 40 MG PO TBEC: 40.0000 mg | DELAYED_RELEASE_TABLET | Freq: Two times a day (BID) | ORAL | 11 refills | Status: DC

## 2018-11-15 MED ORDER — PANTOPRAZOLE SODIUM 40 MG PO TBEC: DELAYED_RELEASE_TABLET | ORAL | 4 refills | Status: DC

## 2018-11-15 MED ORDER — SUCRALFATE 1 GM/10ML PO SUSP: ORAL | 0 refills | Status: DC

## 2018-11-15 NOTE — Progress Notes (Signed)
New to this practice.  Assessment and plan reviewed.

## 2018-11-15 NOTE — Patient Instructions (Signed)
Normal BMI (Body Mass Index- based on height and weight) is between 19 and 25. Your BMI today is Body mass index is 29.19 kg/m. Marland Kitchen Please consider follow up  regarding your BMI with your Primary Care Provider.  We have sent the following medications to your pharmacy for you to pick up at your convenience:

## 2018-11-15 NOTE — Progress Notes (Signed)
Chief Complaint: Epigastric pain, burping  HPI:    Nancy Robertson is a 53 year old female with a past medical history as listed below including reflux and chest pain, known remotely to Dr. Henrene Pastor for a colonoscopy in 2017, who was referred to me by Kathyrn Drown, MD for a complaint of epigastric pain and burping.      07/07/2016 colonoscopy by Dr. Laural Golden for screening given a family history of colon polyps in multiple first-degree relatives.  Findings of 2 small polyps in the sigmoid colon at the splenic flexure, external hemorrhoids and a single small anal papilloma.  Showed tubular adenomas.  Repeat recommended in 5 years.    10/26/2018 CBC, BMP, hepatic function panel, lipase and troponin were all normal.    10/31/2018 right upper quadrant ultrasound was negative.    Today, the patient presents clinic and explains that on January 16 she was driving back home and suddenly had epigastric pain up under her ribs, this was a cramping sensation rated as a 9-10/10.  This then seemed to radiate up into her chest on both the right and left sides consecutively and felt like a burning.  She continued to feel the stomach tightness and proceeded to her PCP as above with labs and work-up that were negative.  Started back on Pantoprazole 40 mg daily and Carafate was added 1 g 3 times daily.  Explains that prior to this she had been on Omeprazole 40 mg for about a month after her previous prescription for Pantoprazole ran out and her insurance would not cover it anymore.  Does admit to a lot of added stress in her life recently but no change in medications or diet and no use of NSAIDs.  The symptoms do sometimes wake her from her sleep.  They have all gotten some better since being back on Pantoprazole and Sucralfate, now described as intermittent.  Patient does report using her Carafate and Pantoprazole together in the mornings.    Describes chronic "trouble with my stomach", is on MiraLAX daily which typically keeps  her with normal bowel movements.    Denies fever, chills, blood in her stool, melena, weight loss, anorexia and nausea.  Past Medical History:  Diagnosis Date  . Allergy   . Asthma    seasonal only  . Chest pain 04/17/2012  . GERD (gastroesophageal reflux disease)   . Mixed hyperlipidemia 04/17/2012  . Palpitations 04/17/2012  . TMJ (dislocation of temporomandibular joint) 04/17/2012    Past Surgical History:  Procedure Laterality Date  . ABDOMINAL HYSTERECTOMY    . BLADDER REPAIR  2010  . COLONOSCOPY N/A 07/07/2016   Procedure: COLONOSCOPY;  Surgeon: Rogene Houston, MD;  Location: [facility];  Service: Endoscopy;  Laterality: N/A;  1030   . CYSTOSCOPY  11/10/2011   Procedure: CYSTOSCOPY;  Surgeon: Delice Lesch, MD;  Location: [facility];  Service: Gynecology;  Laterality: N/A;  . KNEE SURGERY  2007   right  . SALPINGOOPHORECTOMY  11/10/2011   Procedure: SALPINGO OOPHERECTOMY;  Surgeon: Delice Lesch, MD;  Location: [facility];  Service: Gynecology;  Laterality: Bilateral;  . VAGINAL HYSTERECTOMY  11/10/2011   Procedure: HYSTERECTOMY VAGINAL;  Surgeon: Delice Lesch, MD;  Location: [facility];  Service: Gynecology;  Laterality: N/A;  Total Vaginal Hysterectomy, bilateral salpingo-oopherectomy, cystoscopy    Current Outpatient Medications  Medication Sig Dispense Refill  . albuterol (PROVENTIL HFA;VENTOLIN HFA) 108 (90 Base) MCG/ACT inhaler Inhale 2 puffs into the lungs every 6 (six) hours as  needed for wheezing. (Patient not taking: Reported on 10/26/2018) 1 Inhaler 5  . albuterol (PROVENTIL) (2.5 MG/3ML) 0.083% nebulizer solution USE 1 VIAL VIA NEBULIZER EVERY 6 HOURS AS NEEDED FOR WHEEZING OR SHORTNESS OF BREATH (Patient not taking: Reported on 10/26/2018) 75 mL 5  . buPROPion (WELLBUTRIN XL) 300 MG 24 hr tablet     . fluticasone (FLOVENT HFA) 220 MCG/ACT inhaler 2 puff bid daily (Patient not taking: Reported on 10/26/2018) 1 Inhaler 6  . Omega-3 Fatty Acids (EQL OMEGA 3 FISH OIL) 1400 MG  CAPS Take 2 tablets by mouth daily.    Marland Kitchen omeprazole (PRILOSEC) 40 MG capsule Take 1 capsule (40 mg total) by mouth daily. 30 capsule 5  . pantoprazole (PROTONIX) 40 MG tablet TAKE 1 TABLET BY MOUTH EVERY MORNING (Patient not taking: Reported on 10/26/2018) 90 tablet 1  . pantoprazole (PROTONIX) 40 MG tablet Take one tablet by mouth each day 30 tablet 4  . pravastatin (PRAVACHOL) 10 MG tablet TAKE 1 TABLET BY MOUTH DAILY FOR CHOLESTEROL 90 tablet 0  . predniSONE (DELTASONE) 20 MG tablet 3 po qd x 3 d then 2 po qd x 3 d then 1 po qd x 2 d (Patient not taking: Reported on 10/26/2018) 17 tablet 0  . sucralfate (CARAFATE) 1 GM/10ML suspension Take 2 tsp by mouth three times daily 420 mL 0   No current facility-administered medications for this visit.     Allergies as of 11/15/2018 - Review Complete 11/06/2018  Allergen Reaction Noted  . Azithromycin Nausea Only 10/27/2011  . Erythromycin Nausea And Vomiting 10/27/2011  . Qsymia [phentermine-topiramate] Other (See Comments) 07/29/2013  . Penicillins  10/27/2011    Family History  Problem Relation Age of Onset  . Hypertension Father   . Diabetes Father   . Hyperlipidemia Father     Social History   Socioeconomic History  . Marital status: Married    Spouse name: Not on file  . Number of children: Not on file  . Years of education: Not on file  . Highest education level: Not on file  Occupational History  . Not on file  Social Needs  . Financial resource strain: Not on file  . Food insecurity:    Worry: Not on file    Inability: Not on file  . Transportation needs:    Medical: Not on file    Non-medical: Not on file  Tobacco Use  . Smoking status: Never Smoker  . Smokeless tobacco: Never Used  Substance and Sexual Activity  . Alcohol use: Yes    Comment: occasionally wine   . Drug use: No  . Sexual activity: Yes    Birth control/protection: Surgical  Lifestyle  . Physical activity:    Days per week: Not on file     Minutes per session: Not on file  . Stress: Not on file  Relationships  . Social connections:    Talks on phone: Not on file    Gets together: Not on file    Attends religious service: Not on file    Active member of club or organization: Not on file    Attends meetings of clubs or organizations: Not on file    Relationship status: Not on file  . Intimate partner violence:    Fear of current or ex partner: Not on file    Emotionally abused: Not on file    Physically abused: Not on file    Forced sexual activity: Not on file  Other Topics Concern  .  Not on file  Social History Narrative  . Not on file    Review of Systems:    Constitutional: No weight loss, fever or chills Skin: No rash  Cardiovascular: No chest pain Respiratory: No SOB Gastrointestinal: See HPI and otherwise negative Genitourinary: No dysuria  Neurological: No headache, dizziness or syncope Musculoskeletal: No new muscle or joint pain Hematologic: No bleeding  Psychiatric: No history of depression or anxiety   Physical Exam:  Vital signs: BP 116/72   Pulse 72   Ht 5\' 7"  (1.702 m)   Wt 186 lb 6.4 oz (84.6 kg)   LMP 11/04/2011   BMI 29.19 kg/m   Constitutional:   Pleasant Caucasian female appears to be in NAD, Well developed, Well nourished, alert and cooperative Head:  Normocephalic and atraumatic. Eyes:   PEERL, EOMI. No icterus. Conjunctiva pink. Ears:  Normal auditory acuity. Neck:  Supple Throat: Oral cavity and pharynx without inflammation, swelling or lesion.  Respiratory: Respirations even and unlabored. Lungs clear to auscultation bilaterally.   No wheezes, crackles, or rhonchi.  Cardiovascular: Normal S1, S2. No MRG. Regular rate and rhythm. No peripheral edema, cyanosis or pallor.  Gastrointestinal:  Soft, nondistended, mild epigastric ttp, No rebound or guarding. Normal bowel sounds. No appreciable masses or hepatomegaly. Rectal:  Not performed.  Msk:  Symmetrical without gross  deformities. Without edema, no deformity or joint abnormality.  Neurologic:  Alert and  oriented x4;  grossly normal neurologically.  Skin:   Dry and intact without significant lesions or rashes. Psychiatric: Demonstrates good judgement and reason without abnormal affect or behaviors.  RELEVANT LABS AND IMAGING: CBC    Component Value Date/Time   WBC 6.1 10/26/2018 1300   RBC 4.40 10/26/2018 1300   HGB 13.1 10/26/2018 1300   HCT 40.7 10/26/2018 1300   PLT 214 10/26/2018 1300   MCV 92.5 10/26/2018 1300   MCH 29.8 10/26/2018 1300   MCHC 32.2 10/26/2018 1300   RDW 12.6 10/26/2018 1300   LYMPHSABS 2.4 10/26/2018 1300   MONOABS 0.4 10/26/2018 1300   EOSABS 0.2 10/26/2018 1300   BASOSABS 0.0 10/26/2018 1300    CMP     Component Value Date/Time   NA 139 10/26/2018 1300   NA 143 03/08/2018 0821   K 4.2 10/26/2018 1300   CL 105 10/26/2018 1300   CO2 26 10/26/2018 1300   GLUCOSE 94 10/26/2018 1300   BUN 21 (H) 10/26/2018 1300   BUN 16 03/08/2018 0821   CREATININE 0.67 10/26/2018 1300   CALCIUM 9.4 10/26/2018 1300   PROT 7.1 10/26/2018 1300   PROT 6.8 03/08/2018 0821   ALBUMIN 4.4 10/26/2018 1300   ALBUMIN 4.8 03/08/2018 0821   AST 21 10/26/2018 1300   ALT 31 10/26/2018 1300   ALKPHOS 47 10/26/2018 1300   BILITOT 0.5 10/26/2018 1300   BILITOT 0.4 03/08/2018 0821   GFRNONAA >60 10/26/2018 1300   GFRAA >60 10/26/2018 1300    Assessment: 1.  Epigastric pain: Recent right upper quadrant ultrasound and labs including lipase were normal, patient some better now on Pantoprazole 40 mg daily over the past 2 weeks and Carafate; consider most likely gastritis +/- PUD  Plan: 1.  Patient would like to try conservative measures first. 2.  Increase Pantoprazole to 40 mg twice daily, 30-60 minutes before breakfast and dinner #60 with 3 refills 3.  Continue Carafate but discuss timing of this.  Needs to be an hour before or 2 hours after eating and other medications. 4.  Patient will  call clinic if she is not better in 2 weeks.  At that time would recommend an EGD, this would need to be scheduled with Dr. Henrene Pastor in the [facility] 5.  Patient to follow in clinic with me as needed in the near future.  Ellouise Newer, PA-C [facility] 11/15/2018, 8:47 AM  Cc: Kathyrn Drown, MD

## 2018-12-20 DIAGNOSIS — E8881 Metabolic syndrome: Secondary | ICD-10-CM | POA: Diagnosis not present

## 2018-12-20 DIAGNOSIS — E669 Obesity, unspecified: Secondary | ICD-10-CM | POA: Diagnosis not present

## 2018-12-20 DIAGNOSIS — Z6830 Body mass index (BMI) 30.0-30.9, adult: Secondary | ICD-10-CM | POA: Diagnosis not present

## 2018-12-20 DIAGNOSIS — E782 Mixed hyperlipidemia: Secondary | ICD-10-CM | POA: Diagnosis not present

## 2019-02-20 ENCOUNTER — Other Ambulatory Visit: Payer: Self-pay | Admitting: Family Medicine

## 2019-03-11 DIAGNOSIS — Z1231 Encounter for screening mammogram for malignant neoplasm of breast: Secondary | ICD-10-CM | POA: Diagnosis not present

## 2019-03-11 DIAGNOSIS — Z139 Encounter for screening, unspecified: Secondary | ICD-10-CM | POA: Diagnosis not present

## 2019-03-11 DIAGNOSIS — R350 Frequency of micturition: Secondary | ICD-10-CM | POA: Diagnosis not present

## 2019-03-11 DIAGNOSIS — Z01419 Encounter for gynecological examination (general) (routine) without abnormal findings: Secondary | ICD-10-CM | POA: Diagnosis not present

## 2019-03-14 ENCOUNTER — Telehealth: Payer: Self-pay | Admitting: Family Medicine

## 2019-03-14 NOTE — Telephone Encounter (Signed)
Patient dropped off labwork from her OB/GYN that she wants Dr. Nicki Reaper to take a look at.  Put in folder in Dr. Nicki Reaper office. (Told patient that Dr. Nicki Reaper would be out of the office for the next week and she was ok with this)

## 2019-03-15 ENCOUNTER — Telehealth: Payer: Self-pay | Admitting: Family Medicine

## 2019-03-15 NOTE — Telephone Encounter (Signed)
Pt contacted and informed that she would need to do a follow up visit that can be done virtual within the next 30 days. Pt transferred up front to set up appt.

## 2019-03-15 NOTE — Telephone Encounter (Signed)
I reviewed over the labs, see other telephone message

## 2019-03-15 NOTE — Telephone Encounter (Signed)
Nurses I reviewed over the lab work that the patient dropped off from her gynecologic visit She will need to do a follow-up office visit this can be done as a virtual visit. I recommend this to be done within the next 30 days. The lab papers will be scanned into the system  (Of note total cholesterol 239 triglycerides 366, LDL 137, HDL 50 A1c 5.2, hemoglobin 14.9, white blood count 5.4, TSH 1.03, vitamin D 31 creatinine 0.75 liver enzymes normal fasting glucose 90)

## 2019-03-27 ENCOUNTER — Other Ambulatory Visit: Payer: Self-pay

## 2019-03-27 ENCOUNTER — Ambulatory Visit (INDEPENDENT_AMBULATORY_CARE_PROVIDER_SITE_OTHER): Payer: BC Managed Care – PPO | Admitting: Family Medicine

## 2019-03-27 ENCOUNTER — Encounter: Payer: Self-pay | Admitting: Family Medicine

## 2019-03-27 DIAGNOSIS — E781 Pure hyperglyceridemia: Secondary | ICD-10-CM

## 2019-03-27 DIAGNOSIS — E782 Mixed hyperlipidemia: Secondary | ICD-10-CM | POA: Diagnosis not present

## 2019-03-27 DIAGNOSIS — R1013 Epigastric pain: Secondary | ICD-10-CM | POA: Diagnosis not present

## 2019-03-27 HISTORY — DX: Epigastric pain: R10.13

## 2019-03-27 MED ORDER — SUCRALFATE 1 GM/10ML PO SUSP: ORAL | 6 refills | Status: DC

## 2019-03-27 MED ORDER — PRAVASTATIN SODIUM 20 MG PO TABS: ORAL_TABLET | ORAL | 2 refills | Status: DC

## 2019-03-27 NOTE — Progress Notes (Signed)
   Subjective:    Patient ID: Nancy Robertson, female    DOB: 06-14-1966, 53 y.o.   MRN: [id number]  HPI Pt dropped off blood work that was ordered through [employer] at [employer].  See My Chart message from 03/15/2019. We discussed the patient's lab work in detail Also discussed dietary measures importance of weight reduction We also discussed from the standpoint of healthy choices regular physical activity Also patient with mild dyspepsia issues. Cholesterol discussed importance of continuing medication. Virtual Visit via Video Note  I connected with Trent Nigel Bridgeman on 03/27/19 at  1:10 PM EDT by a video enabled telemedicine application and verified that I am speaking with the correct person using two identifiers.  Location: Patient: home Provider: office   I discussed the limitations of evaluation and management by telemedicine and the availability of in person appointments. The patient expressed understanding and agreed to proceed.  History of Present Illness:    Observations/Objective:   Assessment and Plan:   Follow Up Instructions: 25 minutes was spent with the patient.  This statement verifies that 25 minutes was indeed spent with the patient.  More than 50% of this visit-total duration of the visit-was spent in counseling and coordination of care. The issues that the patient came in for today as reflected in the diagnosis (s) please refer to documentation for further details.    I discussed the assessment and treatment plan with the patient. The patient was provided an opportunity to ask questions and all were answered. The patient agreed with the plan and demonstrated an understanding of the instructions.   The patient was advised to call back or seek an in-person evaluation if the symptoms worsen or if the condition fails to improve as anticipated.  I provided 18 minutes of non-face-to-face time during this encounter.   Vicente Males, LPN     Review of Systems   Constitutional: Negative for activity change, appetite change and fatigue.  HENT: Negative for congestion and rhinorrhea.   Respiratory: Negative for cough and shortness of breath.   Cardiovascular: Negative for chest pain and leg swelling.  Gastrointestinal: Negative for abdominal pain and diarrhea.  Endocrine: Negative for polydipsia and polyphagia.  Skin: Negative for color change.  Neurological: Negative for dizziness and weakness.  Psychiatric/Behavioral: Negative for behavioral problems and confusion.       Objective:   Physical Exam  Patient had virtual visit Appears to be in no distress Atraumatic Neuro able to relate and oriented No apparent resp distress Color normal       Assessment & Plan:  Dyspepsia continue acid blocker use sucralfate as needed  Elevated triglycerides watch diet closely check lab work later this year increase the dose of the pravastatin work hard at minimizing starches in the diet-new dose 20 mg each evening  Mild obesity patient will work hard at [employer] regular physical activity watching portions  She will try some intermittent fasting stopping all eating at 7:30 PM and not resuming till morning time  Vitamin D slightly low she will take a 400 IU supplement

## 2019-05-10 DIAGNOSIS — H04123 Dry eye syndrome of bilateral lacrimal glands: Secondary | ICD-10-CM | POA: Diagnosis not present

## 2019-05-22 ENCOUNTER — Other Ambulatory Visit: Payer: Self-pay | Admitting: Family Medicine

## 2019-05-31 ENCOUNTER — Encounter (HOSPITAL_COMMUNITY): Payer: Self-pay | Admitting: Emergency Medicine

## 2019-05-31 ENCOUNTER — Emergency Department (HOSPITAL_COMMUNITY): Payer: BC Managed Care – PPO

## 2019-05-31 ENCOUNTER — Ambulatory Visit (INDEPENDENT_AMBULATORY_CARE_PROVIDER_SITE_OTHER): Payer: BC Managed Care – PPO | Admitting: Family Medicine

## 2019-05-31 ENCOUNTER — Other Ambulatory Visit: Payer: Self-pay

## 2019-05-31 ENCOUNTER — Emergency Department (HOSPITAL_COMMUNITY)
Admission: EM | Admit: 2019-05-31 | Discharge: 2019-05-31 | Disposition: A | Discharge status: Home / Self Care | Payer: BC Managed Care – PPO | Attending: Emergency Medicine | Admitting: Emergency Medicine

## 2019-05-31 DIAGNOSIS — J45909 Unspecified asthma, uncomplicated: Secondary | ICD-10-CM | POA: Insufficient documentation

## 2019-05-31 DIAGNOSIS — Z79899 Other long term (current) drug therapy: Secondary | ICD-10-CM | POA: Insufficient documentation

## 2019-05-31 DIAGNOSIS — J4541 Moderate persistent asthma with (acute) exacerbation: Secondary | ICD-10-CM

## 2019-05-31 DIAGNOSIS — Z20822 Contact with and (suspected) exposure to covid-19: Secondary | ICD-10-CM

## 2019-05-31 DIAGNOSIS — R6889 Other general symptoms and signs: Secondary | ICD-10-CM | POA: Diagnosis not present

## 2019-05-31 DIAGNOSIS — J069 Acute upper respiratory infection, unspecified: Secondary | ICD-10-CM | POA: Diagnosis not present

## 2019-05-31 DIAGNOSIS — R05 Cough: Secondary | ICD-10-CM | POA: Diagnosis not present

## 2019-05-31 DIAGNOSIS — B9789 Other viral agents as the cause of diseases classified elsewhere: Secondary | ICD-10-CM | POA: Diagnosis not present

## 2019-05-31 DIAGNOSIS — Z20828 Contact with and (suspected) exposure to other viral communicable diseases: Secondary | ICD-10-CM | POA: Insufficient documentation

## 2019-05-31 MED ORDER — PREDNISONE 10 MG PO TABS: ORAL_TABLET | ORAL | 0 refills | Status: DC

## 2019-05-31 NOTE — Discharge Instructions (Signed)
See Dr. Wolfgang Phoenix for recheck.  Covid test returns in 3-4 days.

## 2019-05-31 NOTE — ED Triage Notes (Signed)
Pt c/o dry cough, mild SOB when she coughs that began yesterday. Pt also c/o HA. Hx of asthma. Denies fever, sore throat, or loss of taste/smell. Denies COVID contacts.

## 2019-05-31 NOTE — Progress Notes (Signed)
   Subjective:  Audio plus video  Patient ID: Nancy Robertson, female    DOB: [birth year], 53 y.o.   MRN: [id number]  Cough This is a new problem. The current episode started yesterday. Associated symptoms include headaches, nasal congestion and wheezing. Treatments tried: mucinex, nebulizer.   Patient has asthma and states it feels like asthma flare   Review of Systems  Respiratory: Positive for cough and wheezing.   Neurological: Positive for headaches.   Virtual Visit via Video Note  I connected with Nancy Robertson on 05/31/19 at  1:10 PM EDT by a video enabled telemedicine application and verified that I am speaking with the correct person using two identifiers.  Location: Patient: home Provider: office   I discussed the limitations of evaluation and management by telemedicine and the availability of in person appointments. The patient expressed understanding and agreed to proceed.  History of Present Illness:    Observations/Objective:   Assessment and Plan:   Follow Up Instructions:    I discussed the assessment and treatment plan with the patient. The patient was provided an opportunity to ask questions and all were answered. The patient agreed with the plan and demonstrated an understanding of the instructions.   The patient was advised to call back or seek an in-person evaluation if the symptoms worsen or if the condition fails to improve as anticipated.  I provided 25 minutes of non-face-to-face time during this encounter.  Patient has developed a couple days worth of symptoms.  Achy in nature.  Feels like she has a low-grade fever.  Could not measure 1.  Headache.  Some irritation in the throat  Cough.  This is progressed to substantial wheezing.  Diminished energy.  No headache, no major weight loss or weight gain, no chest pain no back pain abdominal pain no change in bowel habits complete ROS otherwise negative       Objective:   Physical Exam Virtual  of note patient was coughing with virtually every sentence while interacting with me       Assessment & Plan:  Impression known asthma now with 2 days duration of symptoms suggestive of suspected COVID-19.  Definite sense of shortness of breath.  Definite frequent wheezing.  Long discussion held.  Numerous questions answered.  Sent to the emergency room for further evaluation.  Patient called later after the ER evaluation.  Chest x-ray was normal.  O2 sat 96%.  Coronavirus test sent.  Warning signs discussed carefully.  Quarantine discussed carefully.  Greater than 50% of this 25 minute face to face visit was spent in counseling and discussion and coordination of care regarding the above diagnosis/diagnosies

## 2019-05-31 NOTE — ED Provider Notes (Signed)
[facility] EMERGENCY DEPARTMENT Provider Note   CSN: [id number] Arrival date & time: 05/31/19  1358     History   Chief Complaint Chief Complaint  Patient presents with  . Cough    HPI Nancy Robertson is a 53 y.o. female.     The history is provided by the patient. No language interpreter was used.  Cough Cough characteristics:  Non-productive Sputum characteristics:  Nondescript Severity:  Moderate Onset quality:  Gradual Duration:  1 day Timing:  Constant Progression:  Worsening Chronicity:  New Smoker: no   Context: upper respiratory infection   Relieved by:  Nothing Worsened by:  Nothing Ineffective treatments:  Beta-agonist inhaler Associated symptoms: no chest pain   Pt complains of asthma attack.  Pt reports she was sent here by Dr. Wolfgang Phoenix for covid testing.  Pt states he wanted her to have rapid testing due to her asthma.    Past Medical History:  Diagnosis Date  . Allergy   . Asthma    seasonal only  . Chest pain 04/17/2012  . Dyspepsia 03/27/2019  . GERD (gastroesophageal reflux disease)   . Mixed hyperlipidemia 04/17/2012  . Palpitations 04/17/2012  . TMJ (dislocation of temporomandibular joint) 04/17/2012    Patient Active Problem List   Diagnosis Date Noted  . Dyspepsia 03/27/2019  . Hypertriglyceridemia 01/21/2014  . Morbid obesity ([place]) 07/31/2013  . Renal cyst, left 07/15/2013  . Chronic low back pain 04/29/2013  . Ischemic colitis ([place]) 12/11/2012  . Chest pain 04/17/2012  . Reflux 04/17/2012  . 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 Mixed hyperlipidemia 04/17/2012  . Palpitations 04/17/2012    Past Surgical History:  Procedure Laterality Date  . BLADDER REPAIR  2010  . COLONOSCOPY N/A 07/07/2016   Procedure: COLONOSCOPY;  Surgeon: Rogene Houston, MD;  [facility];  Service: Endoscopy;  Laterality: N/A;  1030   . CYSTOSCOPY  11/10/2011   Procedure: CYSTOSCOPY;  Surgeon: Delice Lesch, MD;  Location: [place]  [facility];  Service: Gynecology;  Laterality: N/A;  . KNEE SURGERY  2007   right  . SALPINGOOPHORECTOMY  11/10/2011   Procedure: SALPINGO OOPHERECTOMY;  Surgeon: Delice Lesch, MD;  Location: [place] [facility];  Service: Gynecology;  Laterality: Bilateral;  . VAGINAL HYSTERECTOMY  11/10/2011   Procedure: HYSTERECTOMY VAGINAL;  Surgeon: Delice Lesch, MD;  Location: [place] [facility];  Service: Gynecology;  Laterality: N/A;  Total Vaginal Hysterectomy, bilateral salpingo-oopherectomy, cystoscopy     OB History    Gravida  2   Para  2   Term      Preterm      AB      Living        SAB      TAB      Ectopic      Multiple      Live Births               Home Medications    Prior to Admission medications   Medication Sig Start Date End Date Taking? Authorizing Provider  albuterol (PROVENTIL HFA;VENTOLIN HFA) 108 (90 Base) MCG/ACT inhaler Inhale 2 puffs into the lungs every 6 (six) hours as needed for wheezing. 11/28/17  Yes Luking, Scott A, MD  albuterol (PROVENTIL) (2.5 MG/3ML) 0.083% nebulizer solution USE 1 VIAL VIA NEBULIZER EVERY 6 HOURS AS NEEDED FOR WHEEZING OR SHORTNESS OF BREATH 12/04/17  Yes Luking, Scott A, MD  buPROPion (WELLBUTRIN XL) 300 MG 24 hr tablet Take 300 mg by  mouth daily.  05/18/16  Yes [provider]  fluticasone (FLOVENT HFA) 220 MCG/ACT inhaler 2 puff bid daily Patient taking differently: 2 puff bid daily as needed 11/27/17  Yes Luking, Elayne Snare, MD  pantoprazole (PROTONIX) 40 MG tablet Take 1 tablet by mouth once daily 05/22/19  Yes Luking, Scott A, MD  pravastatin (PRAVACHOL) 20 MG tablet Take one tablet by mouth daily for cholesterol 03/27/19  Yes Luking, Scott A, MD  pantoprazole (PROTONIX) 40 MG tablet Take 1 tablet (40 mg total) by mouth 2 (two) times daily. Take 30-60 min before breakfast and dinner 11/15/18   Levin Erp, [place]  predniSONE (DELTASONE) 10 MG tablet [id number] 05/31/19   Caryl Ada K, PA-C  sucralfate (CARAFATE) 1 GM/10ML suspension  Take 2 tsp by mouth three times daily an hour before or 2 hours after eating Patient not taking: Reported on 05/31/2019 03/27/19   Kathyrn Drown, MD    Family History Family History  Problem Relation Age of Onset  . Hypertension Father   . Diabetes Father   . Hyperlipidemia Father   . Lung cancer Father   . COPD Father   . Stroke Father   . Colon polyps Father   . Breast cancer Mother   . Bladder Cancer Paternal Aunt   . Bladder Cancer Paternal Uncle   . Colon cancer Neg Hx   . Esophageal cancer Neg Hx   . Liver disease Neg Hx   . Pancreatic cancer Neg Hx     Social History Social History   Tobacco Use  . Smoking status: Never Smoker  . Smokeless tobacco: Never Used  Substance Use Topics  . Alcohol use: Yes    Comment: occasionally wine   . Drug use: No     Allergies   Azithromycin, Erythromycin, Qsymia [phentermine-topiramate], and Penicillins   Review of Systems Review of Systems  Respiratory: Positive for cough.   Cardiovascular: Negative for chest pain.  All other systems reviewed and are negative.    Physical Exam Updated Vital Signs BP 138/89 (BP Location: Right Arm)   Pulse 76   Temp 98.1 F (36.7 C) (Oral)   Resp 16   Ht 5\' 7"  (1.702 m)   Wt 83.9 kg   LMP 11/04/2011   SpO2 96%   BMI 28.98 kg/m   Physical Exam Vitals signs and nursing note reviewed.  Constitutional:      Appearance: She is well-developed.  HENT:     Head: Normocephalic.     Nose: Nose normal.  Neck:     Musculoskeletal: Normal range of motion.  Cardiovascular:     Rate and Rhythm: Normal rate.  Pulmonary:     Effort: Pulmonary effort is normal.     Breath sounds: Normal breath sounds. No wheezing.     Comments: coughing Abdominal:     General: There is no distension.  Musculoskeletal: Normal range of motion.  Skin:    General: Skin is warm.  Neurological:     General: No focal deficit present.     Mental Status: She is alert and oriented to person, place, and  time.  Psychiatric:        Mood and Affect: Mood normal.      ED Treatments / Results  Labs (all labs ordered are listed, but only abnormal results are displayed) Labs Reviewed  NOVEL CORONAVIRUS, NAA (HOSPITAL ORDER, SEND-OUT TO REF LAB)    EKG None  Radiology Dg Chest 2 View  Result Date: 05/31/2019 CLINICAL  DATA:  Cough, asthma EXAM: CHEST - 2 VIEW COMPARISON:  10/26/2018 FINDINGS: The heart size and mediastinal contours are within normal limits. Both lungs are clear. The visualized skeletal structures are unremarkable. IMPRESSION: No acute abnormality of the lungs. Electronically Signed   By: Eddie Candle M.D.   On: 05/31/2019 14:29    Procedures Procedures (including critical care time)  Medications Ordered in ED Medications - No data to display   Initial Impression / Assessment and Plan / ED Course  I have reviewed the triage vital signs and the nursing notes.  Pertinent labs & imaging results that were available during my care of the patient were reviewed by me and considered in my medical decision making (see chart for details).        MDM  Pt used her inhaler.  I will treat with inhaler.  Pt advised Covid test returns in 3-4 days.  Rapid test not available for outpt evaluation.   Pt advised on covid precautions.  Pt given rx for prednisone.   Final Clinical Impressions(s) / ED Diagnoses   Final diagnoses:  Viral URI with cough  Suspected Covid-19 Virus Infection    ED Discharge Orders         Ordered    predniSONE (DELTASONE) 10 MG tablet     05/31/19 1802        An After Visit Summary was printed and given to the patient.    Fransico Meadow, Hershal Coria 05/31/19 2113    Francine Graven, DO 06/06/19 [phone]

## 2019-06-01 LAB — NOVEL CORONAVIRUS, NAA (HOSP ORDER, SEND-OUT TO REF LAB; TAT 18-24 HRS): SARS-CoV-2, NAA: NOT DETECTED

## 2019-06-01 MED ORDER — ALBUTEROL SULFATE (2.5 MG/3ML) 0.083% IN NEBU: INHALATION_SOLUTION | RESPIRATORY_TRACT | 5 refills | Status: DC

## 2019-06-02 ENCOUNTER — Encounter: Payer: Self-pay | Admitting: Family Medicine

## 2019-06-03 ENCOUNTER — Telehealth: Payer: Self-pay | Admitting: *Deleted

## 2019-06-03 NOTE — Telephone Encounter (Signed)
i'd rec cont'd measures of avoiding others as if she has covid 19 and appt with dr Nicki Reaper tomorrow virtual

## 2019-06-03 NOTE — Telephone Encounter (Signed)
Patient states her breathing is some better-still has cough and congestion-cough sounds more congested-started with H/A yesterday I the front of head and some fatigue. Patient notified that Covid test was negative

## 2019-06-03 NOTE — Telephone Encounter (Signed)
Patient advised and scheduled virtual ER follow up tomorrow with Dr Nicki Reaper.

## 2019-06-04 ENCOUNTER — Other Ambulatory Visit: Payer: Self-pay

## 2019-06-04 ENCOUNTER — Ambulatory Visit (INDEPENDENT_AMBULATORY_CARE_PROVIDER_SITE_OTHER): Payer: BC Managed Care – PPO | Admitting: Family Medicine

## 2019-06-04 DIAGNOSIS — B349 Viral infection, unspecified: Secondary | ICD-10-CM

## 2019-06-04 NOTE — Progress Notes (Signed)
   Subjective:    Patient ID: Nancy Robertson, female    DOB: [birth year], 53 y.o.   MRN: [id number]  HPIFollow up ED visit. covid test was negative. Coughing started last Thursday. Also had a headache.  This patient had significant problems last week with some fatigue tiredness headaches cough congestion nasal congestion and tightness in her lungs.  She was evaluated over the phone and sent to the ER for further testing they did x-ray COVID testing which came back negative patient was treated with prednisone did not start until the following day patient unfortunately had a negative experience in the ER but is starting to turn the corner now starting to breathe better in less fatigue denies any high fever chills sweats Virtual Visit via Video Note  I connected with Nancy Robertson on 06/04/19 at  2:00 PM EDT by a video enabled telemedicine application and verified that I am speaking with the correct person using two identifiers.  Location: Patient: home Provider: office   I discussed the limitations of evaluation and management by telemedicine and the availability of in person appointments. The patient expressed understanding and agreed to proceed.  History of Present Illness:    Observations/Objective:   Assessment and Plan:   Follow Up Instructions:    I discussed the assessment and treatment plan with the patient. The patient was provided an opportunity to ask questions and all were answered. The patient agreed with the plan and demonstrated an understanding of the instructions.   The patient was advised to call back or seek an in-person evaluation if the symptoms worsen or if the condition fails to improve as anticipated.  I provided 17 minutes of non-face-to-face time during this encounter.      Review of Systems  Constitutional: Negative for activity change and fever.  HENT: Positive for congestion and rhinorrhea. Negative for ear pain.   Eyes: Negative for discharge.   Respiratory: Positive for cough and wheezing. Negative for shortness of breath.   Cardiovascular: Negative for chest pain.       Objective:   Physical Exam  Patient had virtual visit Appears to be in no distress Atraumatic Neuro able to relate and oriented No apparent resp distress Color normal       Assessment & Plan:  Viral syndrome Possible COVID Recommend that she stay home the rest of this week Should be able to get back into regular swing of things next week Currently patient working from home Antibiotics not indicated may use albuterol as needed and she is pretty much finished up the prednisone no warning signs found.  Cautions were discussed.

## 2019-06-12 ENCOUNTER — Telehealth: Payer: Self-pay | Admitting: Family Medicine

## 2019-06-12 NOTE — Telephone Encounter (Signed)
Nurses please reach out to Lilo  Let her know that I received her text (In her attacks she states her dad has been going through some tough mental health spells)  I can certainly arrange to see her Dad-we could potentially see him Thursday morning or Friday or do video visit whenever they feel would work best (If he is not having any COVID symptoms we can see him in the office-or if they desire we can see him via video)  Also if she would like for me to call and talk with her please gather which phone number she would prefer and I could call her near the end of my workdays

## 2019-06-12 NOTE — Telephone Encounter (Signed)
Spoke with patient. Pt states she would like father to be seen soon. Pt transferred up front to have in office visit. Danae Chen states that per Dr.Scott, no kids can come with father to office, only the wife) Bryahna would like Dr.Scott to give her a call this afternoon at [phone].

## 2019-06-12 NOTE — Telephone Encounter (Signed)
I did call the patient on Wednesday evening and discussed her father situation thoroughly

## 2019-07-12 ENCOUNTER — Telehealth: Payer: Self-pay

## 2019-07-12 MED ORDER — SUCRALFATE 1 GM/10ML PO SUSP: ORAL | 6 refills | Status: DC

## 2019-07-12 NOTE — Telephone Encounter (Signed)
Ok to refill until Feb 2020- then would need to be seen in follow up. Thanks-JLL

## 2019-07-12 NOTE — Telephone Encounter (Signed)
Refilled as directed.  Verified with PA that ok to fill until Feb 2021

## 2019-07-12 NOTE — Telephone Encounter (Signed)
Incoming fax request from [facility], Carafate 1gm/23ml 3-4 x a day. Last seen 11-2018. No current follow up scheduled. Please advise. Thank you.

## 2019-08-06 ENCOUNTER — Ambulatory Visit (INDEPENDENT_AMBULATORY_CARE_PROVIDER_SITE_OTHER): Payer: BC Managed Care – PPO | Admitting: Family Medicine

## 2019-08-06 ENCOUNTER — Encounter: Payer: Self-pay | Admitting: Family Medicine

## 2019-08-06 DIAGNOSIS — J45901 Unspecified asthma with (acute) exacerbation: Secondary | ICD-10-CM

## 2019-08-06 MED ORDER — ALBUTEROL SULFATE HFA 108 (90 BASE) MCG/ACT IN AERS: 2.0000 | INHALATION_SPRAY | Freq: Four times a day (QID) | RESPIRATORY_TRACT | 4 refills | Status: DC | PRN

## 2019-08-06 MED ORDER — PREDNISONE 20 MG PO TABS: 20.0000 mg | ORAL_TABLET | Freq: Every day | ORAL | 0 refills | Status: DC

## 2019-08-06 MED ORDER — ALBUTEROL SULFATE (2.5 MG/3ML) 0.083% IN NEBU: INHALATION_SOLUTION | RESPIRATORY_TRACT | 5 refills | Status: DC

## 2019-08-06 MED ORDER — QVAR REDIHALER 80 MCG/ACT IN AERB: 2.0000 | INHALATION_SPRAY | Freq: Two times a day (BID) | RESPIRATORY_TRACT | 5 refills | Status: DC

## 2019-08-06 NOTE — Progress Notes (Signed)
   Subjective:    Patient ID: Nancy Robertson, female    DOB: 1966/08/22, 53 y.o.   MRN: [id number]  Wheezing  This is a new problem. Episode onset: 2 days. Associated symptoms include coughing. Pertinent negatives include no chest pain, ear pain, fever, rhinorrhea or shortness of breath. Treatments tried: robitussin dm, mucinex, neb treatments, flovent.  Patient has asthma Does not feel she has Covid Doing a lot of increased coughing wheezing this been progressive over the past several weeks has a history of asthma PMH benign Virtual Visit via Telephone Note  I connected with Nancy Robertson on 08/06/19 at  3:30 PM EDT by telephone and verified that I am speaking with the correct person using two identifiers.  Location: Patient: home Provider: office   I discussed the limitations, risks, security and privacy concerns of performing an evaluation and management service by telephone and the availability of in person appointments. I also discussed with the patient that there may be a patient responsible charge related to this service. The patient expressed understanding and agreed to proceed.   History of Present Illness:    Observations/Objective:   Assessment and Plan:   Follow Up Instructions:    I discussed the assessment and treatment plan with the patient. The patient was provided an opportunity to ask questions and all were answered. The patient agreed with the plan and demonstrated an understanding of the instructions.   The patient was advised to call back or seek an in-person evaluation if the symptoms worsen or if the condition fails to improve as anticipated.  I provided 15 minutes of non-face-to-face time during this encounter.        Review of Systems  Constitutional: Negative for activity change and fever.  HENT: Positive for congestion. Negative for ear pain and rhinorrhea.   Eyes: Negative for discharge.  Respiratory: Positive for cough and wheezing.  Negative for shortness of breath.   Cardiovascular: Negative for chest pain.       Objective:   Physical Exam  Today's visit was via telephone Physical exam was not possible for this visit       Assessment & Plan:  Asthma flareup Highly recommend prednisone taper No need for x-rays lab work currently

## 2019-08-06 NOTE — Telephone Encounter (Signed)
Nurses Please connect with patient We can work with her in many different ways We can do a phone visit Or a virtual visit Or I can even do a outside visit today what ever works best for the patient

## 2019-08-06 NOTE — Telephone Encounter (Signed)
Patient scheduled virtual visit today with Dr Scott 

## 2019-08-14 DIAGNOSIS — N6310 Unspecified lump in the right breast, unspecified quadrant: Secondary | ICD-10-CM | POA: Diagnosis not present

## 2019-08-23 DIAGNOSIS — N63 Unspecified lump in unspecified breast: Secondary | ICD-10-CM | POA: Diagnosis not present

## 2019-09-02 DIAGNOSIS — M545 Low back pain: Secondary | ICD-10-CM | POA: Diagnosis not present

## 2019-09-02 DIAGNOSIS — M546 Pain in thoracic spine: Secondary | ICD-10-CM | POA: Diagnosis not present

## 2019-09-02 DIAGNOSIS — M9902 Segmental and somatic dysfunction of thoracic region: Secondary | ICD-10-CM | POA: Diagnosis not present

## 2019-09-02 DIAGNOSIS — M9903 Segmental and somatic dysfunction of lumbar region: Secondary | ICD-10-CM | POA: Diagnosis not present

## 2019-09-03 ENCOUNTER — Other Ambulatory Visit: Payer: Self-pay | Admitting: Family Medicine

## 2019-09-03 DIAGNOSIS — M9902 Segmental and somatic dysfunction of thoracic region: Secondary | ICD-10-CM | POA: Diagnosis not present

## 2019-09-03 DIAGNOSIS — M546 Pain in thoracic spine: Secondary | ICD-10-CM | POA: Diagnosis not present

## 2019-09-03 DIAGNOSIS — M9903 Segmental and somatic dysfunction of lumbar region: Secondary | ICD-10-CM | POA: Diagnosis not present

## 2019-09-03 DIAGNOSIS — M545 Low back pain: Secondary | ICD-10-CM | POA: Diagnosis not present

## 2019-09-03 MED ORDER — PANTOPRAZOLE SODIUM 40 MG PO TBEC: 40.0000 mg | DELAYED_RELEASE_TABLET | Freq: Every day | ORAL | 0 refills | Status: DC

## 2019-09-04 DIAGNOSIS — M9903 Segmental and somatic dysfunction of lumbar region: Secondary | ICD-10-CM | POA: Diagnosis not present

## 2019-09-04 DIAGNOSIS — M9902 Segmental and somatic dysfunction of thoracic region: Secondary | ICD-10-CM | POA: Diagnosis not present

## 2019-09-04 DIAGNOSIS — M546 Pain in thoracic spine: Secondary | ICD-10-CM | POA: Diagnosis not present

## 2019-09-04 DIAGNOSIS — M545 Low back pain: Secondary | ICD-10-CM | POA: Diagnosis not present

## 2019-09-11 DIAGNOSIS — M9902 Segmental and somatic dysfunction of thoracic region: Secondary | ICD-10-CM | POA: Diagnosis not present

## 2019-09-11 DIAGNOSIS — M546 Pain in thoracic spine: Secondary | ICD-10-CM | POA: Diagnosis not present

## 2019-09-11 DIAGNOSIS — M9903 Segmental and somatic dysfunction of lumbar region: Secondary | ICD-10-CM | POA: Diagnosis not present

## 2019-09-11 DIAGNOSIS — M545 Low back pain: Secondary | ICD-10-CM | POA: Diagnosis not present

## 2019-09-16 ENCOUNTER — Encounter: Payer: Self-pay | Admitting: Family Medicine

## 2019-09-16 ENCOUNTER — Ambulatory Visit (INDEPENDENT_AMBULATORY_CARE_PROVIDER_SITE_OTHER): Payer: BC Managed Care – PPO | Admitting: Family Medicine

## 2019-09-16 ENCOUNTER — Other Ambulatory Visit: Payer: Self-pay

## 2019-09-16 DIAGNOSIS — R05 Cough: Secondary | ICD-10-CM | POA: Diagnosis not present

## 2019-09-16 DIAGNOSIS — R059 Cough, unspecified: Secondary | ICD-10-CM

## 2019-09-16 DIAGNOSIS — J4521 Mild intermittent asthma with (acute) exacerbation: Secondary | ICD-10-CM | POA: Diagnosis not present

## 2019-09-16 DIAGNOSIS — R519 Headache, unspecified: Secondary | ICD-10-CM

## 2019-09-16 MED ORDER — PREDNISONE 20 MG PO TABS: ORAL_TABLET | ORAL | 0 refills | Status: DC

## 2019-09-16 NOTE — Telephone Encounter (Signed)
Please reach out to Kathalene If she is willing I would recommend a virtual visit if I could fit her in today

## 2019-09-16 NOTE — Telephone Encounter (Signed)
Patient scheduled virtual visit today with Dr Scott 

## 2019-09-16 NOTE — Progress Notes (Signed)
   Subjective:    Patient ID: Nancy Robertson, female    DOB: 1966/03/03, 53 y.o.   MRN: [id number]  HPIheadache started over the weekend. Then this morning started having cough.  Patient with significant headache over the past couple days has been under a lot of stress she is undergoing breast biopsy coming up within the next several days plus also her dad has serious underlying health issues plus the patient had to stay with her dad at local hospital for multiple days over the past few days Patient has underlying asthma she does relate some cough and wheeze she denies sweats or chills denies high fever states energy level is subpar denies muscle aches nausea diarrhea Virtual Visit via Video Note  I connected with Wreatha Nigel Bridgeman on 09/16/19 at  4:30 PM EST by a video enabled telemedicine application and verified that I am speaking with the correct person using two identifiers.  Location: Patient: home Provider: office   I discussed the limitations of evaluation and management by telemedicine and the availability of in person appointments. The patient expressed understanding and agreed to proceed.  History of Present Illness:    Observations/Objective:   Assessment and Plan:   Follow Up Instructions:    I discussed the assessment and treatment plan with the patient. The patient was provided an opportunity to ask questions and all were answered. The patient agreed with the plan and demonstrated an understanding of the instructions.   The patient was advised to call back or seek an in-person evaluation if the symptoms worsen or if the condition fails to improve as anticipated.  I provided 18 minutes of non-face-to-face time during this encounter.        Review of Systems  Constitutional: Negative for activity change and fever.  HENT: Positive for congestion and rhinorrhea. Negative for ear pain.   Eyes: Negative for discharge.  Respiratory: Positive for cough and wheezing.  Negative for shortness of breath.   Cardiovascular: Negative for chest pain and leg swelling.       Objective:   Physical Exam  Patient had virtual visit Appears to be in no distress Atraumatic Neuro able to relate and oriented No apparent resp distress Color normal       Assessment & Plan:  Reactive airway Increase her steroid inhaler 3 puffs twice daily for the next week continue rescue inhaler If progressive symptoms go ahead and with prednisone taper  No need for antibiotic currently. Very important for the patient to do Covid testing and stay away from her parents for now Patient also to call us back if any problems certainly if having significant shortness of breath high fever chills very important to go to ER Also patient will advise the place doing a breast biopsy of her current condition they will want to reschedule until they know for certain what her Covid test comes back as

## 2019-09-17 ENCOUNTER — Other Ambulatory Visit: Payer: Self-pay

## 2019-09-17 DIAGNOSIS — Z20822 Contact with and (suspected) exposure to covid-19: Secondary | ICD-10-CM

## 2019-09-19 ENCOUNTER — Other Ambulatory Visit: Payer: Self-pay | Admitting: Radiology

## 2019-09-19 DIAGNOSIS — N6031 Fibrosclerosis of right breast: Secondary | ICD-10-CM | POA: Diagnosis not present

## 2019-09-19 DIAGNOSIS — N6311 Unspecified lump in the right breast, upper outer quadrant: Secondary | ICD-10-CM | POA: Diagnosis not present

## 2019-09-19 LAB — NOVEL CORONAVIRUS, NAA: SARS-CoV-2, NAA: NOT DETECTED

## 2019-09-25 ENCOUNTER — Encounter: Payer: Self-pay | Admitting: Family Medicine

## 2019-10-21 ENCOUNTER — Ambulatory Visit (INDEPENDENT_AMBULATORY_CARE_PROVIDER_SITE_OTHER): Payer: BC Managed Care – PPO | Admitting: Family Medicine

## 2019-10-21 ENCOUNTER — Other Ambulatory Visit: Payer: Self-pay

## 2019-10-21 DIAGNOSIS — E782 Mixed hyperlipidemia: Secondary | ICD-10-CM

## 2019-10-21 DIAGNOSIS — E559 Vitamin D deficiency, unspecified: Secondary | ICD-10-CM

## 2019-10-21 DIAGNOSIS — Z79899 Other long term (current) drug therapy: Secondary | ICD-10-CM | POA: Diagnosis not present

## 2019-10-21 MED ORDER — BUPROPION HCL ER (XL) 300 MG PO TB24: 300.0000 mg | ORAL_TABLET | Freq: Every day | ORAL | 1 refills | Status: DC

## 2019-10-21 NOTE — Progress Notes (Signed)
   Subjective:    Patient ID: Nancy Robertson, female    DOB: 1966-01-15, 54 y.o.   MRN: [id number]  HPI Pt is here for medication refills. Pt states her script for Wellbutrin 300 mg ran out before Christmas. Pt has not taken the Wellbutrin since then; would like refills. Pt has been taking her inhalers as prescribed. Patient states Wellbutrin does help her function she states she feels better when she is taking it.  Denies any major setbacks.  Denies being depressed at times feels little on edge Her asthma has been doing very stable with the inhalers She does have hyperlipidemia takes Pravachol on a regular basis she is due for some lab work.  PMH benign Virtual Visit via Video Note  I connected with Nancy Robertson on 10/21/19 at  2:00 PM EST by a video enabled telemedicine application and verified that I am speaking with the correct person using two identifiers.  Location: Patient: home Provider: office   I discussed the limitations of evaluation and management by telemedicine and the availability of in person appointments. The patient expressed understanding and agreed to proceed.  History of Present Illness:    Observations/Objective:   Assessment and Plan:   Follow Up Instructions:    I discussed the assessment and treatment plan with the patient. The patient was provided an opportunity to ask questions and all were answered. The patient agreed with the plan and demonstrated an understanding of the instructions.   The patient was advised to call back or seek an in-person evaluation if the symptoms worsen or if the condition fails to improve as anticipated.  I provided 15 minutes of non-face-to-face time during this encounter.   Vicente Males, LPN    Review of Systems  Constitutional: Negative for activity change, appetite change and fatigue.  HENT: Negative for congestion and rhinorrhea.   Respiratory: Negative for cough and shortness of breath.   Cardiovascular:  Negative for chest pain and leg swelling.  Gastrointestinal: Negative for abdominal pain and diarrhea.  Endocrine: Negative for polydipsia and polyphagia.  Skin: Negative for color change.  Neurological: Negative for dizziness and weakness.  Psychiatric/Behavioral: Negative for behavioral problems and confusion.       Objective:   Physical Exam  Patient had virtual visit Appears to be in no distress Atraumatic Neuro able to relate and oriented No apparent resp distress Color normal      Assessment & Plan:  Mixed hyperlipidemia - Plan: Lipid Profile, Hepatic function panel, Glucose, Vitamin D (25 hydroxy)  High risk medication use - Plan: Lipid Profile, Hepatic function panel, Glucose, Vitamin D (25 hydroxy)  Vitamin D deficiency - Plan: Lipid Profile, Hepatic function panel, Glucose, Vitamin D (25 hydroxy) ,med 1. Mixed hyperlipidemia Continue Pravachol check lab work await results may need adjustments - Lipid Profile - Hepatic function panel - Glucose - Vitamin D (25 hydroxy)  2. High risk medication use Check liver function - Lipid Profile - Hepatic function panel - Glucose - Vitamin D (25 hydroxy)  3. Vitamin D deficiency Check vitamin D - Lipid Profile - Hepatic function panel - Glucose - Vitamin D (25 hydroxy) Asthma stable continue current measures Stress related issues doing well overall would like to continue Wellbutrin these were given follow-up 6 months

## 2019-11-15 ENCOUNTER — Encounter: Payer: Self-pay | Admitting: Family Medicine

## 2019-11-18 ENCOUNTER — Telehealth: Payer: Self-pay | Admitting: Family Medicine

## 2019-11-18 NOTE — Telephone Encounter (Signed)
Front Please put pt on schedule for 4:30 virtual Thursday with me Pt is aware

## 2019-11-19 NOTE — Telephone Encounter (Signed)
Patient scheduled for Thursday

## 2019-11-21 ENCOUNTER — Ambulatory Visit (INDEPENDENT_AMBULATORY_CARE_PROVIDER_SITE_OTHER): Payer: BC Managed Care – PPO | Admitting: Family Medicine

## 2019-11-21 ENCOUNTER — Other Ambulatory Visit: Payer: Self-pay

## 2019-11-21 DIAGNOSIS — F4321 Adjustment disorder with depressed mood: Secondary | ICD-10-CM

## 2019-11-21 MED ORDER — ALPRAZOLAM 0.25 MG PO TABS: ORAL_TABLET | ORAL | 0 refills | Status: DC

## 2019-11-21 NOTE — Progress Notes (Signed)
   Subjective:    Patient ID: Nancy Robertson, female    DOB: [birth year], 54 y.o.   MRN: [id number]  HPIpt states she is going through grieving process from losing her dad. Trouble sleeping. Feels like she is running 100 miles per hour.  No flowsheet data found. GAD 7 : Generalized Anxiety Score 11/21/2019  Nervous, Anxious, on Edge 2  Control/stop worrying 1  Worry too much - different things 2  Trouble relaxing 3  Restless 3  Easily annoyed or irritable 0  Afraid - awful might happen 1  Total GAD 7 Score 12  Anxiety Difficulty Somewhat difficult      Office Visit from 11/21/2019 in [facility]  PHQ-9 Total Score  10        Virtual Visit via Video Note  I connected with Nancy Robertson on 11/21/19 at  4:20 PM EST by a video enabled telemedicine application and verified that I am speaking with the correct person using two identifiers.  Location: Patient: home Provider: office   I discussed the limitations of evaluation and management by telemedicine and the availability of in person appointments. The patient expressed understanding and agreed to proceed.  History of Present Illness:    Observations/Objective:   Assessment and Plan:   Follow Up Instructions:    I discussed the assessment and treatment plan with the patient. The patient was provided an opportunity to ask questions and all were answered. The patient agreed with the plan and demonstrated an understanding of the instructions.   The patient was advised to call back or seek an in-person evaluation if the symptoms worsen or if the condition fails to improve as anticipated.  I provided 30 minutes of non-face-to-face time during this encounter.        Review of Systems She denies any type of chest tightness pressure pain shortness breath nausea vomiting diarrhea    Objective:   Physical Exam  Today's visit was via telephone Physical exam was not possible for this visit   30 minutes  spent between discussion with the patient as well as documentation    Assessment & Plan:  Significant grief Moderate generalized anxiety related to the grief Mild depression related to the grief I believe the patient is working her way through this.  She has some good plans on how to dissipate some of her feelings and work through them I do not feel the patient needs to be on medication currently Low-dose Xanax can be used when she feels overwhelmed at night but it is not intended for frequent use. Patient will send [place] a MyChart message in 2 weeks Patient will do a follow-up visit in 4 weeks

## 2019-11-27 ENCOUNTER — Encounter: Payer: Self-pay | Admitting: Family Medicine

## 2019-12-15 DIAGNOSIS — Z23 Encounter for immunization: Secondary | ICD-10-CM | POA: Diagnosis not present

## 2019-12-17 ENCOUNTER — Other Ambulatory Visit: Payer: Self-pay

## 2019-12-17 ENCOUNTER — Ambulatory Visit (INDEPENDENT_AMBULATORY_CARE_PROVIDER_SITE_OTHER): Payer: BC Managed Care – PPO | Admitting: Family Medicine

## 2019-12-17 DIAGNOSIS — F4321 Adjustment disorder with depressed mood: Secondary | ICD-10-CM | POA: Diagnosis not present

## 2019-12-17 NOTE — Progress Notes (Signed)
   Subjective:    Patient ID: Nancy Robertson, female    DOB: 1966-05-02, 54 y.o.   MRN: [id number] Very nice patient HPI Very nice patient 46 to 25-minute visit discussing issues currently At time she does find her self feeling down and depressed or feeling like there is a gray cloud over her with difficulty enjoying things but she denies day-to-day feeling depressed denies panic attacks states anxiousness is settled down she has some loss of desire to do stuff but she is purposely trying to reengage and do things to help her self her husband helps her as well and so does her mother Patient call for a follow up on grief. Patient states she has her good days and her bad days. She is trying to get out and do more and find joy.  Virtual Visit via Video Note  I connected with Jaslynn Nigel Bridgeman on 12/17/19 at  4:30 PM EST by a video enabled telemedicine application and verified that I am speaking with the correct person using two identifiers.  Location: Patient: home Provider: office   I discussed the limitations of evaluation and management by telemedicine and the availability of in person appointments. The patient expressed understanding and agreed to proceed.  History of Present Illness:    Observations/Objective:   Assessment and Plan:   Follow Up Instructions:    I discussed the assessment and treatment plan with the patient. The patient was provided an opportunity to ask questions and all were answered. The patient agreed with the plan and demonstrated an understanding of the instructions.   The patient was advised to call back or seek an in-person evaluation if the symptoms worsen or if the condition fails to improve as anticipated.  I provided 25 minutes of non-face-to-face time during this encounter.       Review of Systems  Constitutional: Negative for activity change, appetite change and fatigue.  HENT: Negative for congestion and rhinorrhea.   Respiratory: Negative  for cough and shortness of breath.   Cardiovascular: Negative for chest pain and leg swelling.  Gastrointestinal: Negative for abdominal pain and diarrhea.  Endocrine: Negative for polydipsia and polyphagia.  Skin: Negative for color change.  Neurological: Negative for dizziness and weakness.  Psychiatric/Behavioral: Negative for behavioral problems and confusion.       Objective:   Physical Exam   Patient had virtual visit Appears to be in no distress Atraumatic Neuro able to relate and oriented No apparent resp distress Color normal      Assessment & Plan:  Grief with some level of anxiousness is feeling down as well but I do not feel the patient is depressed I think she is working her way through the natural stages of grief.  This is a journey that is individualized for each patient She understands what to watch for she will notify us if getting worse otherwise we will see her back in 4 weeks to see how things are going and potentially start medication if things are heading in the wrong direction patient will notify us sooner problems

## 2019-12-18 ENCOUNTER — Ambulatory Visit: Payer: BC Managed Care – PPO | Admitting: Family Medicine

## 2019-12-24 ENCOUNTER — Encounter: Payer: Self-pay | Admitting: Family Medicine

## 2020-01-05 DIAGNOSIS — Z23 Encounter for immunization: Secondary | ICD-10-CM | POA: Diagnosis not present

## 2020-01-08 ENCOUNTER — Encounter: Payer: Self-pay | Admitting: Family Medicine

## 2020-01-14 ENCOUNTER — Ambulatory Visit: Payer: BC Managed Care – PPO | Admitting: Family Medicine

## 2020-01-16 ENCOUNTER — Ambulatory Visit (INDEPENDENT_AMBULATORY_CARE_PROVIDER_SITE_OTHER): Payer: BC Managed Care – PPO | Admitting: Family Medicine

## 2020-01-16 ENCOUNTER — Other Ambulatory Visit: Payer: Self-pay

## 2020-01-16 DIAGNOSIS — F4321 Adjustment disorder with depressed mood: Secondary | ICD-10-CM | POA: Diagnosis not present

## 2020-01-16 NOTE — Progress Notes (Signed)
   Subjective:    Patient ID: Nancy Robertson, female    DOB: 1966-06-21, 54 y.o.   MRN: [id number]  HPI Pt needing follow up on depression/grieving. Pt states she is doing better. Still has a feeling of a "grey cloud hanging over her", but not as much as before. Pt is very antsy, feels like she is going 100 mph on the inside.  Had a good discussion with this patient I do not feel that she is depressed She is working through her feelings. Virtual Visit via Video Note  I connected with Lilibeth Nigel Bridgeman on 01/16/20 at  3:50 PM EDT by a video enabled telemedicine application and verified that I am speaking with the correct person using two identifiers.  Location: Patient: home Provider: office   I discussed the limitations of evaluation and management by telemedicine and the availability of in person appointments. The patient expressed understanding and agreed to proceed.  History of Present Illness:    Observations/Objective:   Assessment and Plan:   Follow Up Instructions:    I discussed the assessment and treatment plan with the patient. The patient was provided an opportunity to ask questions and all were answered. The patient agreed with the plan and demonstrated an understanding of the instructions.   The patient was advised to call back or seek an in-person evaluation if the symptoms worsen or if the condition fails to improve as anticipated.  I provided 20 minutes of non-face-to-face time during this encounter.     Patient denies feeling depressed currently working through her feelings  Review of Systems     Objective:   Physical Exam  Patient had virtual visit Appears to be in no distress Atraumatic Neuro able to relate and oriented No apparent resp distress Color normal       Assessment & Plan:  I do not feel the patient is depressed I feel she is working through her grief no counseling indicated no medication indicated current measures continue patient  will reach out to [place] if having any setbacks or problems

## 2020-02-06 ENCOUNTER — Other Ambulatory Visit: Payer: Self-pay | Admitting: Family Medicine

## 2020-02-06 MED ORDER — ALPRAZOLAM 0.25 MG PO TABS: ORAL_TABLET | ORAL | 1 refills | Status: DC

## 2020-02-14 DIAGNOSIS — E782 Mixed hyperlipidemia: Secondary | ICD-10-CM | POA: Diagnosis not present

## 2020-02-14 DIAGNOSIS — E559 Vitamin D deficiency, unspecified: Secondary | ICD-10-CM | POA: Diagnosis not present

## 2020-02-14 DIAGNOSIS — Z79899 Other long term (current) drug therapy: Secondary | ICD-10-CM | POA: Diagnosis not present

## 2020-02-15 LAB — HEPATIC FUNCTION PANEL
ALT: 15 IU/L (ref 0–32)
AST: 18 IU/L (ref 0–40)
Albumin: 4.7 g/dL (ref 3.8–4.9)
Alkaline Phosphatase: 66 IU/L (ref 39–117)
Bilirubin Total: 0.2 mg/dL (ref 0.0–1.2)
Bilirubin, Direct: 0.08 mg/dL (ref 0.00–0.40)
Total Protein: 6.9 g/dL (ref 6.0–8.5)

## 2020-02-15 LAB — GLUCOSE, RANDOM: Glucose: 89 mg/dL (ref 65–99)

## 2020-02-15 LAB — VITAMIN D 25 HYDROXY (VIT D DEFICIENCY, FRACTURES): Vit D, 25-Hydroxy: 66.9 ng/mL (ref 30.0–100.0)

## 2020-02-15 LAB — LIPID PANEL
Chol/HDL Ratio: 4.4 ratio (ref 0.0–4.4)
Cholesterol, Total: 240 mg/dL — ABNORMAL HIGH (ref 100–199)
HDL: 54 mg/dL (ref >39)
LDL Chol Calc (NIH): 143 mg/dL — ABNORMAL HIGH (ref 0–99)
Triglycerides: 241 mg/dL — ABNORMAL HIGH (ref 0–149)
VLDL Cholesterol Cal: 43 mg/dL — ABNORMAL HIGH (ref 5–40)

## 2020-02-17 ENCOUNTER — Telehealth: Payer: Self-pay | Admitting: Family Medicine

## 2020-02-17 ENCOUNTER — Other Ambulatory Visit: Payer: Self-pay | Admitting: Family Medicine

## 2020-02-17 ENCOUNTER — Other Ambulatory Visit: Payer: Self-pay | Admitting: *Deleted

## 2020-02-17 ENCOUNTER — Encounter: Payer: Self-pay | Admitting: Family Medicine

## 2020-02-17 DIAGNOSIS — Z131 Encounter for screening for diabetes mellitus: Secondary | ICD-10-CM

## 2020-02-17 DIAGNOSIS — E782 Mixed hyperlipidemia: Secondary | ICD-10-CM

## 2020-02-17 NOTE — Telephone Encounter (Signed)
Nurses Please check glucose and lipid profile in the fall Preop papers sent to patient with notification to do these at the start of October Diagnosis hyperlipidemia and screening for hyperglycemia

## 2020-02-17 NOTE — Telephone Encounter (Signed)
Blood work ordered in [employer] and papers mailed to patient.

## 2020-02-17 NOTE — Addendum Note (Signed)
Addended by: Dairl Ponder on: 02/17/2020 11:48 AM   Modules accepted: Orders

## 2020-02-18 ENCOUNTER — Ambulatory Visit
Admission: EM | Admit: 2020-02-18 | Discharge: 2020-02-18 | Disposition: A | Discharge status: Home / Self Care | Payer: BC Managed Care – PPO | Attending: Emergency Medicine | Admitting: Emergency Medicine

## 2020-02-18 ENCOUNTER — Encounter: Payer: Self-pay | Admitting: Family Medicine

## 2020-02-18 DIAGNOSIS — J4521 Mild intermittent asthma with (acute) exacerbation: Secondary | ICD-10-CM | POA: Diagnosis not present

## 2020-02-18 MED ORDER — DEXAMETHASONE SODIUM PHOSPHATE 10 MG/ML IJ SOLN
10.0000 mg | Freq: Once | INTRAMUSCULAR | Status: AC
  Administered 2020-02-18: 17:00:00 10 mg via INTRAMUSCULAR

## 2020-02-18 MED ORDER — PREDNISONE 20 MG PO TABS: 20.0000 mg | ORAL_TABLET | Freq: Two times a day (BID) | ORAL | 0 refills | Status: AC

## 2020-02-18 NOTE — ED Provider Notes (Signed)
[facility]   [id number] 02/18/20 Arrival Time: [id number]   [id number]  SUBJECTIVE: History from: patient.  Nancy Robertson is a 54 y.o. female who presents with complaint of intermittent chest tightness and non-productive cough. Triggers: change in weather. Onset abrupt, approximately 1 day ago. No wheezing. Fever: no. Overall normal PO intake without n/v. Sick contacts: no. Typically her asthma is well controlled. Denies fever, chills, nausea, vomiting, SOB, chest pain, abdominal pain, changes in bowel or bladder function.   Inhaler use: intermittent as needed.    ROS: As per HPI.  All other pertinent ROS negative.    Past Medical History:  Diagnosis Date  . Allergy   . Asthma    seasonal only  . Chest pain 04/17/2012  . Dyspepsia 03/27/2019  . GERD (gastroesophageal reflux disease)   . Mixed hyperlipidemia 04/17/2012  . Palpitations 04/17/2012  . TMJ (dislocation of temporomandibular joint) 04/17/2012   Past Surgical History:  Procedure Laterality Date  . BLADDER REPAIR  2010  . COLONOSCOPY N/A 07/07/2016   Procedure: COLONOSCOPY;  Surgeon: Rogene Houston, MD;  [facility];  Service: Endoscopy;  Laterality: N/A;  1030   . CYSTOSCOPY  11/10/2011   Procedure: CYSTOSCOPY;  Surgeon: Delice Lesch, MD;  Location: [facility];  Service: Gynecology;  Laterality: N/A;  . KNEE SURGERY  2007   right  . SALPINGOOPHORECTOMY  11/10/2011   Procedure: SALPINGO OOPHERECTOMY;  Surgeon: Delice Lesch, MD;  Location: [facility];  Service: Gynecology;  Laterality: Bilateral;  . VAGINAL HYSTERECTOMY  11/10/2011   Procedure: HYSTERECTOMY VAGINAL;  Surgeon: Delice Lesch, MD;  Location: [facility];  Service: Gynecology;  Laterality: N/A;  Total Vaginal Hysterectomy, bilateral salpingo-oopherectomy, cystoscopy   Allergies  Allergen Reactions  . Azithromycin Nausea Only  . Erythromycin Nausea And Vomiting  . Qsymia [Phentermine-Topiramate] Other (See Comments)    Fuzzy thinking  .  Penicillins     Reaction from Childhood.   No current facility-administered medications on file prior to encounter.   Current Outpatient Medications on File Prior to Encounter  Medication Sig Dispense Refill  . albuterol (VENTOLIN HFA) 108 (90 Base) MCG/ACT inhaler Inhale 2 puffs into the lungs every 6 (six) hours as needed for wheezing. 18 g 4  . ALPRAZolam (XANAX) 0.25 MG tablet Take one half to one BID prn. Caution drowsiness 10 tablet 1  . beclomethasone (QVAR REDIHALER) 80 MCG/ACT inhaler Inhale 2 puffs into the lungs 2 (two) times daily. 10.6 g 5  . buPROPion (WELLBUTRIN XL) 300 MG 24 hr tablet Take 1 tablet (300 mg total) by mouth daily. 90 tablet 1  . OVER THE COUNTER MEDICATION Vit d3  otc med for hot flashes    . pantoprazole (PROTONIX) 40 MG tablet Take 1 tablet by mouth once daily 90 tablet 1  . sucralfate (CARAFATE) 1 GM/10ML suspension Take 2 tsp by mouth three times daily an hour before or 2 hours after eating 420 mL 6   Social History   Socioeconomic History  . Marital status: Married    Spouse name: Not on file  . Number of children: Not on file  . Years of education: Not on file  . Highest education level: Not on file  Occupational History  . Not on file  Tobacco Use  . Smoking status: Never Smoker  . Smokeless tobacco: Never Used  Substance and Sexual Activity  . Alcohol use: Yes    Comment: occasionally wine   . Drug use: No  .  Sexual activity: Yes    Birth control/protection: Surgical  Other Topics Concern  . Not on file  Social History Narrative  . Not on file   Social Determinants of Health   Financial Resource Strain:   . Difficulty of Paying Living Expenses:   Food Insecurity:   . Worried About Charity fundraiser in the Last Year:   . Arboriculturist in the Last Year:   Transportation Needs:   . Film/video editor (Medical):   Marland Kitchen Lack of Transportation (Non-Medical):   Physical Activity:   . Days of Exercise per Week:   . Minutes of  Exercise per Session:   Stress:   . Feeling of Stress :   Social Connections:   . Frequency of Communication with Friends and Family:   . Frequency of Social Gatherings with Friends and Family:   . Attends Religious Services:   . Active Member of Clubs or Organizations:   . Attends Archivist Meetings:   Marland Kitchen Marital Status:   Intimate Partner Violence:   . Fear of Current or Ex-Partner:   . Emotionally Abused:   Marland Kitchen Physically Abused:   . Sexually Abused:    Family History  Problem Relation Age of Onset  . Hypertension Father   . Diabetes Father   . Hyperlipidemia Father   . Lung cancer Father   . COPD Father   . Stroke Father   . Colon polyps Father   . Breast cancer Mother   . Bladder Cancer Paternal Aunt   . Bladder Cancer Paternal Uncle   . Colon cancer Neg Hx   . Esophageal cancer Neg Hx   . Liver disease Neg Hx   . Pancreatic cancer Neg Hx     OBJECTIVE:  Vitals:   02/18/20 1649  BP: (!) 152/94  Pulse: 80  Resp: 17  Temp: 98.2 F (36.8 C)  TempSrc: Oral  SpO2: 95%     General appearance: alert; appears mildly fatigued HEENT: NCAT; EACs clear, TMS pearly gray; nares patent, no flaring; oropharynx clear Neck: supple without LAD Lungs: unlabored respirations, speaking in full sentences, mild no wheezing; cough: moderate; no significant respiratory distress Skin: warm and dry Psychological: alert and cooperative; normal mood and affect   ASSESSMENT & PLAN:  1. Mild intermittent asthma with acute exacerbation    Meds ordered this encounter  Medications  . predniSONE (DELTASONE) 20 MG tablet    Sig: Take 1 tablet (20 mg total) by mouth 2 (two) times daily with a meal for 5 days.    Dispense:  10 tablet    Refill:  0    Order Specific Question:   Supervising Provider    Answer:   Raylene Everts [id number]  . dexamethasone (DECADRON) injection 10 mg    Steroid shot given in office Prednisone prescribed.   Take steroid as prescribed and to  completion Follow up with PCP as needed Return here or go to ER if you have any new or worsening symptoms such as shortness of breath, difficulty breathing, accessory muscle use, rib retraction, or if symptoms do not improve with medication    Reviewed expectations re: course of current medical issues. Questions answered. Outlined signs and symptoms indicating need for more acute intervention. Patient verbalized understanding. After Visit Summary given.          Lestine Box, PA-C 02/18/20 1710

## 2020-02-18 NOTE — ED Triage Notes (Signed)
Pt presents with increased cough and sob  With asthma exacerbation

## 2020-02-18 NOTE — Discharge Instructions (Signed)
Steroid shot given in office Prednisone prescribed.   Take steroid as prescribed and to completion Follow up with PCP as needed Return here or go to ER if you have any new or worsening symptoms such as shortness of breath, difficulty breathing, accessory muscle use, rib retraction, or if symptoms do not improve with medication

## 2020-02-19 ENCOUNTER — Telehealth: Payer: BC Managed Care – PPO | Admitting: Family Medicine

## 2020-02-19 ENCOUNTER — Other Ambulatory Visit: Payer: Self-pay

## 2020-03-02 DIAGNOSIS — H2513 Age-related nuclear cataract, bilateral: Secondary | ICD-10-CM | POA: Diagnosis not present

## 2020-03-02 DIAGNOSIS — H35711 Central serous chorioretinopathy, right eye: Secondary | ICD-10-CM | POA: Diagnosis not present

## 2020-03-02 DIAGNOSIS — D3132 Benign neoplasm of left choroid: Secondary | ICD-10-CM | POA: Diagnosis not present

## 2020-04-03 ENCOUNTER — Ambulatory Visit (INDEPENDENT_AMBULATORY_CARE_PROVIDER_SITE_OTHER): Payer: Self-pay | Admitting: Family Medicine

## 2020-04-03 ENCOUNTER — Other Ambulatory Visit: Payer: Self-pay

## 2020-04-03 ENCOUNTER — Telehealth: Payer: Self-pay | Admitting: Family Medicine

## 2020-04-03 DIAGNOSIS — J4521 Mild intermittent asthma with (acute) exacerbation: Secondary | ICD-10-CM

## 2020-04-03 MED ORDER — METHYLPREDNISOLONE ACETATE 40 MG/ML IJ SUSP
40.0000 mg | Freq: Once | INTRAMUSCULAR | Status: AC
  Administered 2020-04-03: 40 mg via INTRAMUSCULAR

## 2020-04-03 MED ORDER — PREDNISONE 20 MG PO TABS: ORAL_TABLET | ORAL | 0 refills | Status: DC

## 2020-04-03 NOTE — Telephone Encounter (Signed)
Pt not having sob but she did want to come in to get a steroid shot. Transferred to the front to make appt this morning.

## 2020-04-03 NOTE — Telephone Encounter (Signed)
Nurses Patient having symptoms of asthma flareup.  With cough congestion. She is in between insurance We will try to help her via phone Please do the following #1 gather the medical history of what is going on with her including fever?  Productive cough?  Did this just began the past couple days?  Does the albuterol helped?  More than likely she is having an asthma flareup.  We can offer her calling in some prednisone for her. If she feels she needs to be seen we can do this as a no insurance patient which would be a cash basis and not filed on insurance. Whether or not we see her is more dependent on the following -If she is struggling to breathe or feels extremely short of breath I recommend to be seen -If she feels she needs a shot of steroids I recommend for her to be seen -Otherwise we will try to handle this by phone Her preferred phone number [phone]

## 2020-04-03 NOTE — Progress Notes (Signed)
   Subjective:    Patient ID: Nancy Robertson, female    DOB: 10/22/1965, 54 y.o.   MRN: [id number]  HPI Patient comes in today with complaints of cough and congestion with asthma since yesterday.  Started zyrtec Monday.  Patient has underlying history of reactive airway and was at the beach had flareup started having increased coughing started her steroid inhaler feels like the breath but denies any severe shortness of breath denies fever chills or productive coughing Review of Systems See above    Objective:   Physical Exam Spastic cough noted otherwise lungs clear heart regular extremities no edema not respiratory distress       Assessment & Plan:  Reactive airway Recommend Depo-Medrol Short course prednisone Follow-up if progressive troubles or worse No need for antibiotic

## 2020-04-05 ENCOUNTER — Other Ambulatory Visit: Payer: Self-pay | Admitting: Family Medicine

## 2020-05-18 DIAGNOSIS — H35711 Central serous chorioretinopathy, right eye: Secondary | ICD-10-CM | POA: Diagnosis not present

## 2020-05-18 DIAGNOSIS — H2513 Age-related nuclear cataract, bilateral: Secondary | ICD-10-CM | POA: Diagnosis not present

## 2020-05-18 DIAGNOSIS — D3132 Benign neoplasm of left choroid: Secondary | ICD-10-CM | POA: Diagnosis not present

## 2020-06-18 ENCOUNTER — Other Ambulatory Visit: Payer: Self-pay | Admitting: Family Medicine

## 2020-07-02 ENCOUNTER — Encounter: Payer: Self-pay | Admitting: Family Medicine

## 2020-07-07 ENCOUNTER — Other Ambulatory Visit: Payer: Self-pay | Admitting: Family Medicine

## 2020-07-07 ENCOUNTER — Encounter: Payer: Self-pay | Admitting: Family Medicine

## 2020-07-07 NOTE — Telephone Encounter (Signed)
90-day 90-day with 1 refill

## 2020-07-08 MED ORDER — BUPROPION HCL ER (XL) 300 MG PO TB24: 300.0000 mg | ORAL_TABLET | Freq: Every day | ORAL | 0 refills | Status: DC

## 2020-07-16 ENCOUNTER — Other Ambulatory Visit: Payer: Self-pay

## 2020-07-16 ENCOUNTER — Ambulatory Visit
Admission: RE | Admit: 2020-07-16 | Discharge: 2020-07-16 | Disposition: A | Discharge status: Home / Self Care | Payer: BC Managed Care – PPO | Source: Ambulatory Visit | Attending: Emergency Medicine | Admitting: Emergency Medicine

## 2020-07-16 VITALS — BP 122/81 | HR 100 | Temp 98.1°F | Resp 17

## 2020-07-16 DIAGNOSIS — J4521 Mild intermittent asthma with (acute) exacerbation: Secondary | ICD-10-CM

## 2020-07-16 DIAGNOSIS — R053 Chronic cough: Secondary | ICD-10-CM

## 2020-07-16 MED ORDER — ALBUTEROL SULFATE (2.5 MG/3ML) 0.083% IN NEBU
2.5000 mg | INHALATION_SOLUTION | Freq: Four times a day (QID) | RESPIRATORY_TRACT | 12 refills | Status: DC | PRN
Start: 2020-07-16 — End: 2020-08-21

## 2020-07-16 MED ORDER — DEXAMETHASONE SODIUM PHOSPHATE 10 MG/ML IJ SOLN
10.0000 mg | Freq: Once | INTRAMUSCULAR | Status: AC
  Administered 2020-07-16: 10 mg via INTRAMUSCULAR

## 2020-07-16 MED ORDER — PREDNISONE 10 MG (21) PO TBPK
ORAL_TABLET | Freq: Every day | ORAL | 0 refills | Status: DC
Start: 2020-07-16 — End: 2020-08-21

## 2020-07-16 NOTE — ED Provider Notes (Signed)
[facility]   [id number] 07/16/20 Arrival Time: 6578   [id number]  SUBJECTIVE: History from: patient.  Nancy Robertson is a 54 y.o. female who presents with complaint of intermittent non-productive cough and SOB. Triggers: change in weather. Onset abrupt, approximately 1 day ago. Describes wheezing as none when present. Fever: no. Overall normal PO intake without n/v. Sick contacts: no.  Typically her asthma is well controlled. Denies fever, chills, nausea, vomiting, SOB, chest pain, abdominal pain, changes in bowel or bladder function.  Used inhaler without much relief.     ROS: As per HPI.  All other pertinent ROS negative.    Past Medical History:  Diagnosis Date  . Allergy   . Asthma    seasonal only  . Chest pain 04/17/2012  . Dyspepsia 03/27/2019  . GERD (gastroesophageal reflux disease)   . Mixed hyperlipidemia 04/17/2012  . Palpitations 04/17/2012  . TMJ (dislocation of temporomandibular joint) 04/17/2012   Past Surgical History:  Procedure Laterality Date  . BLADDER REPAIR  2010  . COLONOSCOPY N/A 07/07/2016   Procedure: COLONOSCOPY;  Surgeon: Rogene Houston, MD;  [facility];  Service: Endoscopy;  Laterality: N/A;  1030   . CYSTOSCOPY  11/10/2011   Procedure: CYSTOSCOPY;  Surgeon: Delice Lesch, MD;  Location: [facility];  Service: Gynecology;  Laterality: N/A;  . KNEE SURGERY  2007   right  . SALPINGOOPHORECTOMY  11/10/2011   Procedure: SALPINGO OOPHERECTOMY;  Surgeon: Delice Lesch, MD;  Location: [facility];  Service: Gynecology;  Laterality: Bilateral;  . VAGINAL HYSTERECTOMY  11/10/2011   Procedure: HYSTERECTOMY VAGINAL;  Surgeon: Delice Lesch, MD;  Location: [facility];  Service: Gynecology;  Laterality: N/A;  Total Vaginal Hysterectomy, bilateral salpingo-oopherectomy, cystoscopy   Allergies  Allergen Reactions  . Azithromycin Nausea Only  . Erythromycin Nausea And Vomiting  . Qsymia [Phentermine-Topiramate] Other (See Comments)    Fuzzy thinking   . Penicillins     Reaction from Childhood.   No current facility-administered medications on file prior to encounter.   Current Outpatient Medications on File Prior to Encounter  Medication Sig Dispense Refill  . albuterol (VENTOLIN HFA) 108 (90 Base) MCG/ACT inhaler Inhale 2 puffs into the lungs every 6 (six) hours as needed for wheezing. 18 g 4  . ALPRAZolam (XANAX) 0.25 MG tablet Take one half to one BID prn. Caution drowsiness 10 tablet 1  . buPROPion (WELLBUTRIN XL) 300 MG 24 hr tablet Take 1 tablet (300 mg total) by mouth daily. 90 tablet 0  . cetirizine (ZYRTEC) 10 MG tablet Take 10 mg by mouth daily.    Marland Kitchen OVER THE COUNTER MEDICATION Vit d3  otc med for hot flashes    . pantoprazole (PROTONIX) 40 MG tablet Take 1 tablet by mouth once daily 90 tablet 1  . QVAR REDIHALER 80 MCG/ACT inhaler INHALE 2 PUFFS INTO THE LUNGS TWICE DAILY 10.6 g 5  . [DISCONTINUED] sucralfate (CARAFATE) 1 GM/10ML suspension Take 2 tsp by mouth three times daily an hour before or 2 hours after eating 420 mL 6   Social History   Socioeconomic History  . Marital status: Married    Spouse name: Not on file  . Number of children: Not on file  . Years of education: Not on file  . Highest education level: Not on file  Occupational History  . Not on file  Tobacco Use  . Smoking status: Never Smoker  . Smokeless tobacco: Never Used  Vaping Use  . Vaping  Use: Never used  Substance and Sexual Activity  . Alcohol use: Yes    Comment: occasionally wine   . Drug use: No  . Sexual activity: Yes    Birth control/protection: Surgical  Other Topics Concern  . Not on file  Social History Narrative  . Not on file   Social Determinants of Health   Financial Resource Strain:   . Difficulty of Paying Living Expenses: Not on file  Food Insecurity:   . Worried About Charity fundraiser in the Last Year: Not on file  . Ran Out of Food in the Last Year: Not on file  Transportation Needs:   . Lack of  Transportation (Medical): Not on file  . Lack of Transportation (Non-Medical): Not on file  Physical Activity:   . Days of Exercise per Week: Not on file  . Minutes of Exercise per Session: Not on file  Stress:   . Feeling of Stress : Not on file  Social Connections:   . Frequency of Communication with Friends and Family: Not on file  . Frequency of Social Gatherings with Friends and Family: Not on file  . Attends Religious Services: Not on file  . Active Member of Clubs or Organizations: Not on file  . Attends Archivist Meetings: Not on file  . Marital Status: Not on file  Intimate Partner Violence:   . Fear of Current or Ex-Partner: Not on file  . Emotionally Abused: Not on file  . Physically Abused: Not on file  . Sexually Abused: Not on file   Family History  Problem Relation Age of Onset  . Hypertension Father   . Diabetes Father   . Hyperlipidemia Father   . Lung cancer Father   . COPD Father   . Stroke Father   . Colon polyps Father   . Breast cancer Mother   . Bladder Cancer Paternal Aunt   . Bladder Cancer Paternal Uncle   . Colon cancer Neg Hx   . Esophageal cancer Neg Hx   . Liver disease Neg Hx   . Pancreatic cancer Neg Hx     OBJECTIVE:  Vitals:   07/16/20 1705  BP: 122/81  Pulse: 100  Resp: 17  Temp: 98.1 F (36.7 C)  TempSrc: Oral  SpO2: 95%     General appearance: alert; well-appearing, nontoxic; speaking in broken sentences due to persistent cough HEENT: NCAT; Ears: EACs clear, TMs pearly gray; Eyes: PERRL.  EOM grossly intact.Nose: nares patent without rhinorrhea, Throat: oropharynx clear, tonsils non erythematous or enlarged, uvula midline  Neck: supple without LAD Lungs: unlabored respirations, symmetrical air entry; cough: persistent; mild respiratory distress; CTAB Heart: regular rate and rhythm.  Skin: warm and dry Psychological: alert and cooperative; normal mood and affect   ASSESSMENT & PLAN:  1. Persistent dry cough     2. Mild intermittent asthma with exacerbation      Meds ordered this encounter  Medications  . predniSONE (STERAPRED UNI-PAK 21 TAB) 10 MG (21) TBPK tablet    Sig: Take by mouth daily. Take 6 tabs by mouth daily  for 2 days, then 5 tabs for 2 days, then 4 tabs for 2 days, then 3 tabs for 2 days, 2 tabs for 2 days, then 1 tab by mouth daily for 2 days    Dispense:  42 tablet    Refill:  0    Order Specific Question:   Supervising Provider    Answer:   Raylene Everts [[id number]]  .  albuterol (PROVENTIL) (2.5 MG/3ML) 0.083% nebulizer solution    Sig: Take 3 mLs (2.5 mg total) by nebulization every 6 (six) hours as needed for wheezing or shortness of breath.    Dispense:  75 mL    Refill:  12    Order Specific Question:   Supervising Provider    Answer:   Raylene Everts [[id number]]    Steroid shot given in office Prednisone prescribed Albuterol solution for nebulizer treatment at home prescriabed Follow up with PCP as needed Return here or go to ER if you have any new or worsening symptoms such as shortness of breath, difficulty breathing, accessory muscle use, rib retraction, or if symptoms do not improve with medication    Reviewed expectations re: course of current medical issues. Questions answered. Outlined signs and symptoms indicating need for more acute intervention. Patient verbalized understanding. After Visit Summary given.          Lestine Box, PA-C 07/16/20 1802

## 2020-07-16 NOTE — Discharge Instructions (Signed)
Steroid shot given in office Prednisone prescribed Albuterol solution for nebulizer treatment at home prescriabed Follow up with PCP as needed Return here or go to ER if you have any new or worsening symptoms such as shortness of breath, difficulty breathing, accessory muscle use, rib retraction, or if symptoms do not improve with medication

## 2020-07-16 NOTE — ED Triage Notes (Signed)
Pt presents with cough making her short of breath that started today. History of asthma and this feels the same. Inhalers are not helping. Pt states that weather and season change can sometimes cause this on. She did get her flu shot and covid shot last week. Denies any other symptoms.

## 2020-07-29 ENCOUNTER — Telehealth: Payer: Self-pay

## 2020-07-29 DIAGNOSIS — E782 Mixed hyperlipidemia: Secondary | ICD-10-CM

## 2020-07-29 DIAGNOSIS — Z0000 Encounter for general adult medical examination without abnormal findings: Secondary | ICD-10-CM

## 2020-07-29 DIAGNOSIS — Z1159 Encounter for screening for other viral diseases: Secondary | ICD-10-CM

## 2020-07-29 DIAGNOSIS — Z79899 Other long term (current) drug therapy: Secondary | ICD-10-CM

## 2020-07-29 DIAGNOSIS — Z114 Encounter for screening for human immunodeficiency virus [HIV]: Secondary | ICD-10-CM

## 2020-07-29 NOTE — Telephone Encounter (Signed)
Patient has physical 11/12 and needing labs done

## 2020-07-29 NOTE — Telephone Encounter (Signed)
Lipid, metabolic 7, hyperlipidemia, screening  Also please let patient know that it is recommended by CDC to do a one-time testing for hepatitis C and HIV.  If she is okay with that I recommend hepatitis C antibody and HIV antibody.  Per CDC screening guidelines thank you

## 2020-07-29 NOTE — Telephone Encounter (Signed)
Discussed with pt. Pt does want the hiv and hep c since its recommended. Orders put in.

## 2020-07-29 NOTE — Telephone Encounter (Signed)
Last labs in systems 02/17/20; not complete; glucose random and lipid. Please advise. Thank you

## 2020-08-14 DIAGNOSIS — E782 Mixed hyperlipidemia: Secondary | ICD-10-CM | POA: Diagnosis not present

## 2020-08-14 DIAGNOSIS — Z1159 Encounter for screening for other viral diseases: Secondary | ICD-10-CM | POA: Diagnosis not present

## 2020-08-14 DIAGNOSIS — Z79899 Other long term (current) drug therapy: Secondary | ICD-10-CM | POA: Diagnosis not present

## 2020-08-14 DIAGNOSIS — Z114 Encounter for screening for human immunodeficiency virus [HIV]: Secondary | ICD-10-CM | POA: Diagnosis not present

## 2020-08-14 DIAGNOSIS — Z0000 Encounter for general adult medical examination without abnormal findings: Secondary | ICD-10-CM | POA: Diagnosis not present

## 2020-08-15 ENCOUNTER — Encounter: Payer: Self-pay | Admitting: Nurse Practitioner

## 2020-08-15 ENCOUNTER — Encounter: Payer: Self-pay | Admitting: Family Medicine

## 2020-08-15 LAB — BASIC METABOLIC PANEL
BUN/Creatinine Ratio: 31 — ABNORMAL HIGH (ref 9–23)
BUN: 23 mg/dL (ref 6–24)
CO2: 24 mmol/L (ref 20–29)
Calcium: 9.4 mg/dL (ref 8.7–10.2)
Chloride: 107 mmol/L — ABNORMAL HIGH (ref 96–106)
Creatinine, Ser: 0.74 mg/dL (ref 0.57–1.00)
GFR calc Af Amer: 105 mL/min/{1.73_m2} (ref >59)
GFR calc non Af Amer: 91 mL/min/{1.73_m2} (ref >59)
Glucose: 91 mg/dL (ref 65–99)
Potassium: 4.9 mmol/L (ref 3.5–5.2)
Sodium: 144 mmol/L (ref 134–144)

## 2020-08-15 LAB — LIPID PANEL
Chol/HDL Ratio: 5.3 ratio — ABNORMAL HIGH (ref 0.0–4.4)
Cholesterol, Total: 255 mg/dL — ABNORMAL HIGH (ref 100–199)
HDL: 48 mg/dL (ref >39)
LDL Chol Calc (NIH): 168 mg/dL — ABNORMAL HIGH (ref 0–99)
Triglycerides: 210 mg/dL — ABNORMAL HIGH (ref 0–149)
VLDL Cholesterol Cal: 39 mg/dL (ref 5–40)

## 2020-08-15 LAB — HIV ANTIBODY (ROUTINE TESTING W REFLEX): HIV Screen 4th Generation wRfx: NONREACTIVE

## 2020-08-15 LAB — HEPATITIS C ANTIBODY: Hep C Virus Ab: <0.1 s/co ratio (ref 0.0–0.9)

## 2020-08-21 ENCOUNTER — Telehealth: Payer: Self-pay | Admitting: *Deleted

## 2020-08-21 ENCOUNTER — Encounter: Payer: Self-pay | Admitting: Nurse Practitioner

## 2020-08-21 ENCOUNTER — Ambulatory Visit (INDEPENDENT_AMBULATORY_CARE_PROVIDER_SITE_OTHER): Payer: BC Managed Care – PPO | Admitting: Nurse Practitioner

## 2020-08-21 VITALS — BP 124/80 | HR 84 | Temp 97.7°F | Ht 65.25 in | Wt 200.2 lb

## 2020-08-21 DIAGNOSIS — Z1231 Encounter for screening mammogram for malignant neoplasm of breast: Secondary | ICD-10-CM

## 2020-08-21 DIAGNOSIS — Z01419 Encounter for gynecological examination (general) (routine) without abnormal findings: Secondary | ICD-10-CM | POA: Diagnosis not present

## 2020-08-21 DIAGNOSIS — F419 Anxiety disorder, unspecified: Secondary | ICD-10-CM | POA: Diagnosis not present

## 2020-08-21 DIAGNOSIS — Z0000 Encounter for general adult medical examination without abnormal findings: Secondary | ICD-10-CM

## 2020-08-21 DIAGNOSIS — F32A Depression, unspecified: Secondary | ICD-10-CM

## 2020-08-21 DIAGNOSIS — E782 Mixed hyperlipidemia: Secondary | ICD-10-CM

## 2020-08-21 DIAGNOSIS — N898 Other specified noninflammatory disorders of vagina: Secondary | ICD-10-CM

## 2020-08-21 MED ORDER — ESCITALOPRAM OXALATE 10 MG PO TABS: 10.0000 mg | ORAL_TABLET | Freq: Every day | ORAL | 0 refills | Status: DC

## 2020-08-21 NOTE — Progress Notes (Signed)
Subjective:    Subjective   Patient ID: Nancy Robertson, female    DOB: Dec 25, 1965, 54 y.o.   MRN: [id number]  HPI The patient comes in today for a wellness visit, s/p total hysterectomy in 2013, with concerns of feeling depressed and anxious. COVID booster and Flu shot received 07/2020. Patient receives routine eye and dental. Routine Mammogram due this year. Colonoscopy 2017.  Patient is employed the Audiological scientist, married with 1 daughter and 1 son. She reports loss of her father in 10/2019, to whom she was very close to and difficulty processing her grief. She states her son is also struggling with transitioning from highschool to adult life and feels this causes her significant anxiety. At home she journals,partcipates in yoga, receives routine massages, and has transitioned to a work from home position. She is sleeping well with the help of Xanax 0.58m at night. She feels overall her depression symptoms are improving though her anxiety seems to be worsening. PMH significant for asthma, hyperlipidemia, and morbid obesity. She reports seasonal allergies. Patient states her asthma is well controlled with Albuterol, using approximately once per week, and QVAR. She has experienced asthma exacerbation but related them to seasonal changes. She denies tobacco, recreational drug, or vape use. She occassional consumes ETOH.  A review of their health history was completed.  A review of medications was also completed.  Any needed refills; none  Eating habits: health conscious  Falls/  MVA accidents in past few months: none  Regular exercise: none  Specialist pt sees on regular basis: none  Preventative health issues were discussed.   Additional concerns: mental health, asthma, stress Has a "lump" in the mid back area that has been there for a long time. Non tender. Her massage therapist has not noticed any significant increase in size. Does not bother her.    Review of  Systems Review of Systems  Constitutional: Negative for chills, fever, malaise/fatigue and weight loss.  HENT: Negative for congestion and sore throat.   Eyes: Negative for blurred vision.  Respiratory: Negative for cough, shortness of breath and wheezing.   Cardiovascular: Negative for chest pain, palpitations and leg swelling.  Gastrointestinal: Positive for abdominal pain (bloating with red meat). Negative for diarrhea, heartburn, nausea and vomiting.  Genitourinary: Negative for dysuria, frequency and urgency.       Vaginal dryness  Skin: Negative for rash.  Endo/Heme/Allergies: Positive for environmental allergies.  Psychiatric/Behavioral: Positive for depression. Negative for hallucinations, substance abuse and suicidal ideas. The patient is nervous/anxious. The patient does not have insomnia.    PHQ9 SCORE ONLY 08/21/2020 11/21/2019  PHQ-9 Total Score 16 10   GAD 7 : Generalized Anxiety Score 08/21/2020 11/21/2019  Nervous, Anxious, on Edge 3 2  Control/stop worrying 2 1  Worry too much - different things 1 2  Trouble relaxing 2 3  Restless 1 3  Easily annoyed or irritable 1 0  Afraid - awful might happen 2 1  Total GAD 7 Score 12 12  Anxiety Difficulty Not difficult at all Somewhat difficult         Objective:   Objective  Vitals:   08/21/20 0827  BP: 124/80  Pulse: 84  Temp: 97.7 F (36.5 C)  SpO2: 98%  Physical Exam Exam conducted with a chaperone present.  Constitutional:      General: She is not in acute distress.    Appearance: Normal appearance. She is not ill-appearing.  HENT:     Mouth/Throat:  Mouth: Mucous membranes are moist.     Pharynx: Oropharynx is clear. No posterior oropharyngeal erythema.  Eyes:     Conjunctiva/sclera: Conjunctivae normal.  Neck:     Thyroid: No thyroid mass, thyromegaly or thyroid tenderness.  Cardiovascular:     Rate and Rhythm: Normal rate and regular rhythm.     Heart sounds: Normal heart sounds. No murmur  heard.  No gallop.   Pulmonary:     Effort: Pulmonary effort is normal. No respiratory distress.     Breath sounds: Normal breath sounds. No wheezing.  Chest:     Chest wall: No mass.     Breasts: Breasts are symmetrical.        Right: Normal. No swelling, bleeding, mass, skin change or tenderness.        Left: Normal. No swelling, bleeding, mass, skin change or tenderness.  Abdominal:     General: There is no distension.     Palpations: Abdomen is soft. There is no mass.     Tenderness: There is no abdominal tenderness.  Genitourinary:    General: Normal vulva.     Exam position: Lithotomy position.     Labia:        Right: No rash, tenderness or lesion.        Left: No rash, tenderness or lesion.      Urethra: No urethral lesion.     Vagina: Normal. No vaginal discharge, erythema, tenderness or lesions.     Rectum: No external hemorrhoid.       Comments: BiManual: No masses or tenderness present during exam Musculoskeletal:        General: Normal range of motion.     Cervical back: Full passive range of motion without pain.  Lymphadenopathy:     Cervical: No cervical adenopathy.     Upper Body:     Right upper body: No supraclavicular, axillary or pectoral adenopathy.     Left upper body: No supraclavicular, axillary or pectoral adenopathy.  Skin:    General: Skin is warm and dry.       Neurological:     Mental Status: She is alert and oriented to person, place, and time.  Psychiatric:        Mood and Affect: Mood is depressed. Affect is tearful.        Behavior: Behavior normal.        Thought Content: Thought content normal.        Judgment: Judgment normal.   Soft, well defined slightly mobile mass noted mid back area. Non tender. Consistent with lipoma.  Recent Results (from the past 2160 hour(s))  Lipid panel     Status: Abnormal   Collection Time: 08/14/20  8:15 AM  Result Value Ref Range   Cholesterol, Total 255 (H) 100 - 199 mg/dL   Triglycerides 210 (H)  0 - 149 mg/dL   HDL 48 >39 mg/dL   VLDL Cholesterol Cal 39 5 - 40 mg/dL   LDL Chol Calc (NIH) 168 (H) 0 - 99 mg/dL   Chol/HDL Ratio 5.3 (H) 0.0 - 4.4 ratio    Comment:                                   T. Chol/HDL Ratio  Men  Women                               1/2 Avg.Risk  3.4    3.3                                   Avg.Risk  5.0    4.4                                2X Avg.Risk  9.6    7.1                                3X Avg.Risk 23.4   44.8   Basic metabolic panel     Status: Abnormal   Collection Time: 08/14/20  8:15 AM  Result Value Ref Range   Glucose 91 65 - 99 mg/dL   BUN 23 6 - 24 mg/dL   Creatinine, Ser 0.74 0.57 - 1.00 mg/dL   GFR calc non Af Amer 91 >59 mL/min/1.73   GFR calc Af Amer 105 >59 mL/min/1.73    Comment: **In accordance with recommendations from the NKF-ASN Task force,**   [facility] is in the process of updating its eGFR calculation to the   2021 CKD-EPI creatinine equation that estimates kidney function   without a race variable.    BUN/Creatinine Ratio 31 (H) 9 - 23   Sodium 144 134 - 144 mmol/L   Potassium 4.9 3.5 - 5.2 mmol/L   Chloride 107 (H) 96 - 106 mmol/L   CO2 24 20 - 29 mmol/L   Calcium 9.4 8.7 - 10.2 mg/dL  Hepatitis C antibody     Status: None   Collection Time: 08/14/20  8:15 AM  Result Value Ref Range   Hep C Virus Ab <0.1 0.0 - 0.9 s/co ratio    Comment:                                   Negative:     < 0.8                              Indeterminate: 0.8 - 0.9                                   Positive:     > 0.9  The CDC recommends that a positive HCV antibody result  be followed up with a HCV Nucleic Acid Amplification  test (185631).   HIV Antibody (routine testing w rflx)     Status: None   Collection Time: 08/14/20  8:15 AM  Result Value Ref Range   HIV Screen 4th Generation wRfx Non Reactive Non Reactive   Labs were reviewed with patient         Assessment & Plan:     Problem List Items Addressed This Visit      Other   Mixed hyperlipidemia    Other Visit Diagnoses    Well woman exam    -  Primary   Screening mammogram for breast cancer  Relevant Orders   MM 3D SCREEN BREAST BILATERAL   Anxiety and depression       Vaginal dryness         Meds ordered this encounter  Medications  . escitalopram (LEXAPRO) 10 MG tablet    Sig: Take 1 tablet (10 mg total) by mouth daily.    Dispense:  30 tablet    Refill:  0    Do not file with insurance will use [facility]    Order Specific Question:   Supervising Provider    Answer:   Kathyrn Drown [phone]     Education provide to patient regarding benefits of therapy.  Patient will consider therapy if medications do not improve current symptoms prior to dose adjustment. Educated patient regarding side effects of SSRI use including SI and Serotonin Syndrome  KY Jelly during intercourse for vaginal dryness Replens or Luvena OTC for vaginal dryness Continue Wellbutrin for now. Plan to decrease medicine or stop if needed.  Goal cholesterol of 140: Discussed exercise and diet modifications. Hold on medication at this time. Stop SSRI and notify office if increased or worsening symptoms or significant side effects.  Continue QVAR, Albuterol for rescue inhaler Mammogram scheduled Discussed coping skills for anxiety ie: Ted Talks, Relaxation Techniques  Return in about 1 month (around 09/20/2020) for medication check up. Call back sooner if needed.

## 2020-08-21 NOTE — Progress Notes (Deleted)
The patient comes in today for a wellness visit.    A review of their health history was completed.  A review of medications was also completed.  Any needed refills; none  Eating habits: health conscious  Falls/  MVA accidents in past few months: none  Regular exercise: none  Specialist pt sees on regular basis: none  Preventative health issues were discussed.   Additional concerns: mental health, asthma, stress

## 2020-08-21 NOTE — Telephone Encounter (Signed)
Discussed with pt. Pt verbalized understanding.  °

## 2020-08-21 NOTE — Patient Instructions (Addendum)
Consider counseling  SSRI to assist with depression  KY Jelly during intercourse Replens or Luvena OTC  Continue Wellbutrin for now. Goal cholesterol of 140: Exercise and diet modifications Stop SSRI and notify office if SSRI increases symptoms, causes SI Continue QVAR, Albuterol for rescue inhaler Mammogram scheduled Clare Gandy talks: Failure to Fly/Launch

## 2020-08-21 NOTE — Telephone Encounter (Signed)
Left message to return call. Pt seen today and screening mammo ordered by provider. Pt wanted at breast center. Last one done at [facility]. I called breast center and they cannot schedule without last images from [facility]. I talked with michelle at the breast center and she states she will send over a request to [facility] to get those records and then will call pt to schedule after reviewing records. I left message with pt to let her know she should hear back from breast center about mammo and if she does not she should let us know so we can follow up on it.

## 2020-08-21 NOTE — Progress Notes (Signed)
   Subjective:    Patient ID: Nancy Robertson, female    DOB: 1966-09-13, 54 y.o.   MRN: [id number]  HPI The patient comes in today for a wellness visit.    A review of their health history was completed.  A review of medications was also completed.  Any needed refills; none  Eating habits: health conscious  Falls/  MVA accidents in past few months: none  Regular exercise: none  Specialist pt sees on regular basis: none  Preventative health issues were discussed.   Additional concerns: mental health, asthma, stress    Review of Systems     Objective:   Physical Exam        Assessment & Plan:

## 2020-08-22 ENCOUNTER — Encounter: Payer: Self-pay | Admitting: Nurse Practitioner

## 2020-09-08 ENCOUNTER — Encounter: Payer: Self-pay | Admitting: Nurse Practitioner

## 2020-09-18 ENCOUNTER — Other Ambulatory Visit: Payer: Self-pay | Admitting: Nurse Practitioner

## 2020-09-21 ENCOUNTER — Other Ambulatory Visit: Payer: Self-pay | Admitting: Nurse Practitioner

## 2020-09-21 MED ORDER — ESCITALOPRAM OXALATE 10 MG PO TABS: 10.0000 mg | ORAL_TABLET | Freq: Every day | ORAL | 0 refills | Status: DC

## 2020-10-14 ENCOUNTER — Encounter: Payer: Self-pay | Admitting: Nurse Practitioner

## 2020-10-16 ENCOUNTER — Encounter: Payer: Self-pay | Admitting: Family Medicine

## 2020-10-16 ENCOUNTER — Telehealth (INDEPENDENT_AMBULATORY_CARE_PROVIDER_SITE_OTHER): Payer: BC Managed Care – PPO | Admitting: Family Medicine

## 2020-10-16 ENCOUNTER — Telehealth: Payer: Self-pay | Admitting: Family Medicine

## 2020-10-16 ENCOUNTER — Other Ambulatory Visit: Payer: Self-pay | Admitting: Nurse Practitioner

## 2020-10-16 ENCOUNTER — Other Ambulatory Visit: Payer: Self-pay

## 2020-10-16 DIAGNOSIS — M9901 Segmental and somatic dysfunction of cervical region: Secondary | ICD-10-CM | POA: Diagnosis not present

## 2020-10-16 DIAGNOSIS — J4540 Moderate persistent asthma, uncomplicated: Secondary | ICD-10-CM

## 2020-10-16 DIAGNOSIS — M546 Pain in thoracic spine: Secondary | ICD-10-CM | POA: Diagnosis not present

## 2020-10-16 DIAGNOSIS — M9902 Segmental and somatic dysfunction of thoracic region: Secondary | ICD-10-CM | POA: Diagnosis not present

## 2020-10-16 DIAGNOSIS — M542 Cervicalgia: Secondary | ICD-10-CM | POA: Diagnosis not present

## 2020-10-16 MED ORDER — BUDESONIDE-FORMOTEROL FUMARATE 80-4.5 MCG/ACT IN AERO: 2.0000 | INHALATION_SPRAY | Freq: Two times a day (BID) | RESPIRATORY_TRACT | 3 refills | Status: DC

## 2020-10-16 MED ORDER — PREDNISONE 10 MG PO TABS: ORAL_TABLET | ORAL | 0 refills | Status: DC

## 2020-10-16 MED ORDER — BUPROPION HCL ER (XL) 150 MG PO TB24: 150.0000 mg | ORAL_TABLET | Freq: Every day | ORAL | 0 refills | Status: DC

## 2020-10-16 NOTE — Telephone Encounter (Signed)
Patient had phone visit this morning and was told to check with drug store about symbicort and its ok to prescribe. [facility]

## 2020-10-16 NOTE — Progress Notes (Signed)
Patient ID: Nancy Robertson, female    DOB: [birth year], 55 y.o.   MRN: [id number]   Chief Complaint  Patient presents with  . Asthma   Subjective:  CC: asthma not controled  This is a chronic problem.  Presents today via telephone visit with a complaint of having trouble with my asthma since October.  Symptoms are worse at night, has tried Mucinex with lots of hydration, Zyrtec and her routine inhalers.  She denies fever, chills, chest pain and wheezing.  She does endorse some shortness of breath.  She is currently on a rescue inhaler and inhaled corticosteroid inhaler.  She is able to converse with me throughout the telephone visit without any need to stop to catch her breath, reports that the change of weather triggers her asthma, but it has been worse since October.   trouble with asthma since October. Having a cough. Mostly at night and cannot get rid of the cough. Thick white jelly stuff comes up in the mornings. Using albuterol and qvar inhaler. Tried mucinex and drinking a lot of water, zyrtec.   Virtual Visit via Telephone Note  I connected with Nancy Robertson on 10/16/20 at 10:00 AM EST by telephone and verified that I am speaking with the correct person using two identifiers.  Location: Patient: home Provider: office   I discussed the limitations, risks, security and privacy concerns of performing an evaluation and management service by telephone and the availability of in person appointments. I also discussed with the patient that there may be a patient responsible charge related to this service. The patient expressed understanding and agreed to proceed.   History of Present Illness:    Observations/Objective:   Assessment and Plan:   Follow Up Instructions:    I discussed the assessment and treatment plan with the patient. The patient was provided an opportunity to ask questions and all were answered. The patient agreed with the plan and demonstrated an  understanding of the instructions.   The patient was advised to call back or seek an in-person evaluation if the symptoms worsen or if the condition fails to improve as anticipated.  I provided 23 minutes of non-face-to-face time during this encounter.       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   Outpatient Encounter Medications as of 10/16/2020  Medication Sig  . albuterol (VENTOLIN HFA) 108 (90 Base) MCG/ACT inhaler Inhale 2 puffs into the lungs every 6 (six) hours as needed for wheezing.  . budesonide-formoterol (SYMBICORT) 80-4.5 MCG/ACT inhaler Inhale 2 puffs into the lungs 2 (two) times daily.  . cetirizine (ZYRTEC) 10 MG tablet Take 10 mg by mouth daily.  Marland Kitchen escitalopram (LEXAPRO) 10 MG tablet Take 1 tablet (10 mg total) by mouth daily.  Marland Kitchen OVER THE COUNTER MEDICATION Vit d3  otc med for hot flashes  . pantoprazole (PROTONIX) 40 MG tablet Take 1 tablet by mouth once daily  . predniSONE (DELTASONE) 10 MG tablet Take 3 tablets by mouth for three days, then 2 tablets by mouth for 3 days, then 1 tablet by mouth for 3 days.  . [DISCONTINUED] ALPRAZolam (XANAX) 0.25 MG tablet Take one half to one BID prn. Caution drowsiness  . [DISCONTINUED] buPROPion (WELLBUTRIN XL) 300 MG 24 hr tablet Take 1 tablet (300 mg total) by mouth daily.  . [DISCONTINUED] QVAR REDIHALER 80  MCG/ACT inhaler INHALE 2 PUFFS INTO THE LUNGS TWICE DAILY  . [DISCONTINUED] sucralfate (CARAFATE) 1 GM/10ML suspension Take 2 tsp by mouth three times daily an hour before or 2 hours after eating   No facility-administered encounter medications on file as of 10/16/2020.     Review of Systems  Constitutional: Negative for chills, fatigue and fever.  HENT: Positive for postnasal drip. Negative  for congestion, sinus pressure and sinus pain.   Respiratory: Positive for cough and shortness of breath. Negative for wheezing.   Cardiovascular: Negative for chest pain.  Gastrointestinal: Negative for abdominal pain.  Genitourinary: Negative for dysuria.  Skin: Negative for rash.  Hematological: Negative for adenopathy.       Dentist noticed glands swollen past Wednesday     Vitals LMP 11/04/2011   Objective:   Physical Exam  unable Assessment and Plan   1. Moderate persistent asthma, unspecified whether complicated - predniSONE (DELTASONE) 10 MG tablet; Take 3 tablets by mouth for three days, then 2 tablets by mouth for 3 days, then 1 tablet by mouth for 3 days.  Dispense: 18 tablet; Refill: 0 - budesonide-formoterol (SYMBICORT) 80-4.5 MCG/ACT inhaler; Inhale 2 puffs into the lungs 2 (two) times daily.  Dispense: 1 each; Refill: 3   Will send prednisone taper.  She has an allergy to medication she tried for weight loss, computer states there is a contraindication to prescribe budesonide/formoterol, the allergy she reported is "feels fuzzy ".  She reports that she is going to speak with her pharmacist and discuss the possibility of this medication.  Phoned back and gave report that pharmacist said it would be okay to try Symbicort based on the information of feeling fuzzy only with that medication.  Symbicort ordered-- I feel to be better controlled with that SABA, LABA, ICS combination.  She will be instructed to stop her ICS that she currently has.  Agrees with plan of care discussed today. Understands warning signs to seek further care: Chest pain, shortness of breath, any significant change in health. Understands to follow-up if symptoms do not improve, or worsen.  She is instructed that she will need an in person visit to address the swollen glands.    10/16/2020

## 2020-10-22 ENCOUNTER — Encounter: Payer: BC Managed Care – PPO | Admitting: Family Medicine

## 2020-10-27 ENCOUNTER — Ambulatory Visit (INDEPENDENT_AMBULATORY_CARE_PROVIDER_SITE_OTHER): Payer: BC Managed Care – PPO | Admitting: Family Medicine

## 2020-10-27 ENCOUNTER — Encounter: Payer: Self-pay | Admitting: Family Medicine

## 2020-10-27 ENCOUNTER — Other Ambulatory Visit: Payer: Self-pay

## 2020-10-27 VITALS — BP 130/76 | HR 98 | Temp 95.4°F | Ht 65.25 in | Wt 204.4 lb

## 2020-10-27 DIAGNOSIS — R221 Localized swelling, mass and lump, neck: Secondary | ICD-10-CM | POA: Diagnosis not present

## 2020-10-27 NOTE — Progress Notes (Signed)
Pt here to have chin puffiness evaluated. Pt dentist noticed it on 09/30/20 and then again with massage therapist on 10/14/20. Pt states I feels tender and puffy. Pt did just finish prednisone taper. Left ear has been feeling full and feeling of pressure in ear.     Patient ID: Nancy Robertson, female    DOB: [birth year], 54 y.o.   MRN: [id number]   No chief complaint on file.  Subjective:  Cc; Puffiness in chin area  This is a new problem.  Presents today with a complaint fullness in the throat, and swollen glands.  Reports that her dentist and her massage therapist have noticed this puffiness under her chin.  She does report that she has gained 30 pounds in the last year since her father passed away.  She denies fever, chills, chest pain, shortness of breath, and has never smoked.  Does not report any tenderness in the throat area.  No choking or difficulty swallowing food.      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   Outpatient Encounter Medications as of 10/27/2020  Medication Sig  . albuterol (VENTOLIN HFA) 108 (90 Base) MCG/ACT inhaler Inhale 2 puffs into the lungs every 6 (six) hours as needed for wheezing.  . budesonide-formoterol (SYMBICORT) 80-4.5 MCG/ACT inhaler Inhale 2 puffs into the lungs 2 (two) times daily.  Marland Kitchen buPROPion (WELLBUTRIN XL) 150 MG 24 hr tablet Take 1 tablet (150 mg total) by mouth daily.  . cetirizine (ZYRTEC) 10 MG tablet Take 10 mg by mouth daily.  Marland Kitchen escitalopram (LEXAPRO) 10 MG tablet Take 1 tablet (10 mg total) by mouth daily.  Marland Kitchen OVER THE COUNTER MEDICATION Vit d3  otc med for hot flashes  . pantoprazole (PROTONIX) 40 MG tablet Take 1 tablet by mouth once daily  . [DISCONTINUED] predniSONE (DELTASONE) 10 MG tablet Take 3 tablets by  mouth for three days, then 2 tablets by mouth for 3 days, then 1 tablet by mouth for 3 days.  . [DISCONTINUED] sucralfate (CARAFATE) 1 GM/10ML suspension Take 2 tsp by mouth three times daily an hour before or 2 hours after eating   No facility-administered encounter medications on file as of 10/27/2020.     Review of Systems  Constitutional: Negative for chills, fatigue and fever.  HENT: Negative for congestion.   Respiratory: Positive for cough. Negative for shortness of breath.        Leftover from URI  Cardiovascular: Negative for chest pain and leg swelling.  Gastrointestinal: Negative for abdominal pain, diarrhea, nausea and vomiting.  Genitourinary: Negative for dysuria.  Musculoskeletal: Negative for arthralgias, back pain, joint swelling and myalgias.  Skin: Negative for rash.  Neurological: Negative for dizziness, weakness and headaches.  Hematological: Negative for adenopathy.       No tenderness noted in neck. Area is "puffy"      Vitals BP 130/76   Pulse 98   Temp (!) 95.4 F (35.2 C)   Ht 5' 5.25" (1.657 m)   Wt 204 lb 6.4 oz (92.7 kg)   LMP 11/04/2011   SpO2 97%   BMI 33.75 kg/m   Objective:   Physical Exam Vitals reviewed.  Constitutional:      Appearance: Normal appearance.  HENT:     Mouth/Throat:     Pharynx: Uvula midline. No pharyngeal swelling, posterior oropharyngeal erythema or uvula swelling.  Tonsils: No tonsillar exudate. 0 on the right. 0 on the left.     Comments: Feels fullness in throat area. Denies choking or difficulty in swallowing. Area under chin "puffy" Cardiovascular:     Rate and Rhythm: Normal rate and regular rhythm.     Heart sounds: Normal heart sounds.  Pulmonary:     Effort: Pulmonary effort is normal.     Breath sounds: Normal breath sounds.  Musculoskeletal:     Cervical back: No tenderness.  Lymphadenopathy:     Cervical: No cervical adenopathy.  Skin:    General: Skin is warm and dry.  Neurological:      General: No focal deficit present.     Mental Status: She is alert.  Psychiatric:        Behavior: Behavior normal.      Assessment and Plan   1. Neck swelling - TSH - US Soft Tissue Head/Neck (NON-THYROID)   No difficulty swallowing, no choking with food, no node tenderness upon palpation.  Will get soft tissue ultrasound of the neck and TSH to assess thyroid function. Non-smoker. Last labs done 08-14-20 with wellness exam (not TSH).   Agrees with plan of care discussed today. Understands warning signs to seek further care: chest pain, shortness of breath, any significant change in health.  Understands to follow-up in one month. Will notify of lab result and ultrasound result once become available. Once results are available next steps will be decided.     Chalmers Guest, NP 10/27/2020

## 2020-10-28 ENCOUNTER — Telehealth: Payer: Self-pay | Admitting: *Deleted

## 2020-10-28 LAB — TSH: TSH: 0.896 u[IU]/mL (ref 0.450–4.500)

## 2020-10-28 NOTE — Telephone Encounter (Signed)
Pt called and states she never heard back about mammo. Pt last done one at [facility] dec 2020 and was not happy with them and wanted to have next one due now at the breast center ( see note from 11/12) I called back to breast center and talked with margaretta and she states she will check with medical records to see if they got report from [place] and if not she will call medical records but will probably be tomorrow since its late in the day and medical records did not pick up when she called. She states she will call pt and get her scheduled after she gets records. Would like to hold this message til pt is schedule since it was not scheduled in November.

## 2020-10-29 ENCOUNTER — Encounter: Payer: Self-pay | Admitting: Nurse Practitioner

## 2020-10-30 DIAGNOSIS — M546 Pain in thoracic spine: Secondary | ICD-10-CM | POA: Diagnosis not present

## 2020-10-30 DIAGNOSIS — M542 Cervicalgia: Secondary | ICD-10-CM | POA: Diagnosis not present

## 2020-10-30 DIAGNOSIS — M9902 Segmental and somatic dysfunction of thoracic region: Secondary | ICD-10-CM | POA: Diagnosis not present

## 2020-10-30 DIAGNOSIS — M9901 Segmental and somatic dysfunction of cervical region: Secondary | ICD-10-CM | POA: Diagnosis not present

## 2020-10-30 NOTE — Telephone Encounter (Signed)
Patient left my chart message: Alyson Locket called this afternoon; requesting all mamo records from 2015--2020 from [place] for complete history.  Mammogram - March 4th  9:50 am

## 2020-11-06 ENCOUNTER — Other Ambulatory Visit: Payer: Self-pay | Admitting: Nurse Practitioner

## 2020-11-06 DIAGNOSIS — M542 Cervicalgia: Secondary | ICD-10-CM | POA: Diagnosis not present

## 2020-11-06 DIAGNOSIS — M9902 Segmental and somatic dysfunction of thoracic region: Secondary | ICD-10-CM | POA: Diagnosis not present

## 2020-11-06 DIAGNOSIS — M9901 Segmental and somatic dysfunction of cervical region: Secondary | ICD-10-CM | POA: Diagnosis not present

## 2020-11-06 DIAGNOSIS — M546 Pain in thoracic spine: Secondary | ICD-10-CM | POA: Diagnosis not present

## 2020-11-06 MED ORDER — ESCITALOPRAM OXALATE 10 MG PO TABS: 10.0000 mg | ORAL_TABLET | Freq: Every day | ORAL | 0 refills | Status: DC

## 2020-11-13 DIAGNOSIS — M9901 Segmental and somatic dysfunction of cervical region: Secondary | ICD-10-CM | POA: Diagnosis not present

## 2020-11-13 DIAGNOSIS — M9902 Segmental and somatic dysfunction of thoracic region: Secondary | ICD-10-CM | POA: Diagnosis not present

## 2020-11-13 DIAGNOSIS — M546 Pain in thoracic spine: Secondary | ICD-10-CM | POA: Diagnosis not present

## 2020-11-13 DIAGNOSIS — M542 Cervicalgia: Secondary | ICD-10-CM | POA: Diagnosis not present

## 2020-11-19 ENCOUNTER — Other Ambulatory Visit: Payer: Self-pay

## 2020-11-19 ENCOUNTER — Ambulatory Visit (HOSPITAL_COMMUNITY)
Admission: RE | Admit: 2020-11-19 | Discharge: 2020-11-19 | Disposition: A | Discharge status: Home / Self Care | Payer: BC Managed Care – PPO | Source: Ambulatory Visit | Attending: Family Medicine | Admitting: Family Medicine

## 2020-11-19 DIAGNOSIS — R221 Localized swelling, mass and lump, neck: Secondary | ICD-10-CM | POA: Insufficient documentation

## 2020-11-24 ENCOUNTER — Other Ambulatory Visit: Payer: Self-pay | Admitting: *Deleted

## 2020-11-24 ENCOUNTER — Encounter: Payer: Self-pay | Admitting: Family Medicine

## 2020-11-24 DIAGNOSIS — K1120 Sialoadenitis, unspecified: Secondary | ICD-10-CM

## 2020-11-27 ENCOUNTER — Ambulatory Visit: Payer: BC Managed Care – PPO | Admitting: Family Medicine

## 2020-12-01 ENCOUNTER — Encounter: Payer: Self-pay | Admitting: Family Medicine

## 2020-12-01 ENCOUNTER — Telehealth: Payer: Self-pay | Admitting: Family Medicine

## 2020-12-01 DIAGNOSIS — J4540 Moderate persistent asthma, uncomplicated: Secondary | ICD-10-CM

## 2020-12-01 MED ORDER — BUPROPION HCL ER (XL) 150 MG PO TB24: 150.0000 mg | ORAL_TABLET | Freq: Every day | ORAL | 1 refills | Status: DC

## 2020-12-01 MED ORDER — PANTOPRAZOLE SODIUM 40 MG PO TBEC: 40.0000 mg | DELAYED_RELEASE_TABLET | Freq: Every day | ORAL | 1 refills | Status: DC

## 2020-12-01 MED ORDER — ESCITALOPRAM OXALATE 10 MG PO TABS: 10.0000 mg | ORAL_TABLET | Freq: Every day | ORAL | 1 refills | Status: DC

## 2020-12-01 MED ORDER — BUDESONIDE-FORMOTEROL FUMARATE 80-4.5 MCG/ACT IN AERO: 2.0000 | INHALATION_SPRAY | Freq: Two times a day (BID) | RESPIRATORY_TRACT | 1 refills | Status: DC

## 2020-12-01 NOTE — Telephone Encounter (Signed)
May have 90-day with 1 refill

## 2020-12-01 NOTE — Addendum Note (Signed)
Addended by: Dairl Ponder on: 12/01/2020 11:01 AM   Modules accepted: Orders

## 2020-12-01 NOTE — Telephone Encounter (Signed)
[facility] requesting refill on Symbicort inhaler, Escitalopram 10 mg, Bupropion XL tabs 150 mg, Pantoprazole 40 mg. Pt last seen  10/27/20 for neck swelling. Please advise. Thank you

## 2020-12-01 NOTE — Telephone Encounter (Signed)
Prescriptions sent electronically to pharmacy 

## 2020-12-04 DIAGNOSIS — M9902 Segmental and somatic dysfunction of thoracic region: Secondary | ICD-10-CM | POA: Diagnosis not present

## 2020-12-04 DIAGNOSIS — M9901 Segmental and somatic dysfunction of cervical region: Secondary | ICD-10-CM | POA: Diagnosis not present

## 2020-12-04 DIAGNOSIS — M542 Cervicalgia: Secondary | ICD-10-CM | POA: Diagnosis not present

## 2020-12-04 DIAGNOSIS — M546 Pain in thoracic spine: Secondary | ICD-10-CM | POA: Diagnosis not present

## 2020-12-11 ENCOUNTER — Ambulatory Visit
Admission: RE | Admit: 2020-12-11 | Discharge: 2020-12-11 | Disposition: A | Discharge status: Home / Self Care | Payer: BC Managed Care – PPO | Source: Ambulatory Visit | Attending: Nurse Practitioner | Admitting: Nurse Practitioner

## 2020-12-11 ENCOUNTER — Other Ambulatory Visit: Payer: Self-pay

## 2020-12-11 DIAGNOSIS — Z1231 Encounter for screening mammogram for malignant neoplasm of breast: Secondary | ICD-10-CM | POA: Diagnosis not present

## 2020-12-22 ENCOUNTER — Encounter: Payer: Self-pay | Admitting: Family Medicine

## 2020-12-22 ENCOUNTER — Other Ambulatory Visit: Payer: Self-pay

## 2020-12-22 ENCOUNTER — Ambulatory Visit (HOSPITAL_COMMUNITY)
Admission: RE | Admit: 2020-12-22 | Discharge: 2020-12-22 | Disposition: A | Discharge status: Home / Self Care | Payer: BC Managed Care – PPO | Source: Ambulatory Visit | Attending: Family Medicine | Admitting: Family Medicine

## 2020-12-22 DIAGNOSIS — R221 Localized swelling, mass and lump, neck: Secondary | ICD-10-CM | POA: Diagnosis not present

## 2020-12-22 DIAGNOSIS — K1120 Sialoadenitis, unspecified: Secondary | ICD-10-CM | POA: Diagnosis not present

## 2020-12-22 MED ORDER — IOHEXOL 300 MG/ML  SOLN
75.0000 mL | Freq: Once | INTRAMUSCULAR | Status: AC | PRN
  Administered 2020-12-22: 75 mL via INTRAVENOUS

## 2020-12-23 ENCOUNTER — Telehealth: Payer: Self-pay | Admitting: Family Medicine

## 2020-12-23 NOTE — Telephone Encounter (Signed)
Front Please put the patient on my schedule for 4:10 PM regarding follow-up on CT scan.  Thursday, March 17 Patient is aware.

## 2020-12-24 ENCOUNTER — Other Ambulatory Visit: Payer: Self-pay

## 2020-12-24 ENCOUNTER — Ambulatory Visit (INDEPENDENT_AMBULATORY_CARE_PROVIDER_SITE_OTHER): Payer: BC Managed Care – PPO | Admitting: Family Medicine

## 2020-12-24 ENCOUNTER — Encounter: Payer: Self-pay | Admitting: Family Medicine

## 2020-12-24 VITALS — BP 113/74 | HR 62 | Temp 95.6°F | Wt 195.4 lb

## 2020-12-24 DIAGNOSIS — R221 Localized swelling, mass and lump, neck: Secondary | ICD-10-CM

## 2020-12-24 NOTE — Progress Notes (Signed)
   Subjective:    Patient ID: Nancy Robertson, female    DOB: Jun 12, 1966, 55 y.o.   MRN: [id number]  HPI Pt here to discuss results of recent CT scan. Had CT Soft Tissue Neck on 12/22/20.  Patient denies any dysphagia burning in her throat or hoarseness  Review of Systems See above occasionally coughs up whitish-pinkish phlegm in the morning time more than likely postnasal drainage    Objective:   Physical Exam Lungs clear respiratory rate normal heart regular no murmurs eardrums are normal neck no masses no lymphadenopathy examination of the parotid gland and salivary glands does not reveal any masses CT and ultrasound reviewed in detail  Thyroid not enlarged     Assessment & Plan:  Patient for follow-up of mild lymphadenopathy issues versus salivary gland swelling.  No worrisome findings on ultrasound or CT scan.  Will go ahead with watchful waiting.  No need for any further testing currently.  Reassurance given.  It is recommended to repeat evaluation again in 3 months.  Patient is working hard at [employer] regular physical activity and watching portions and working hard to lose weight.

## 2020-12-25 DIAGNOSIS — M9901 Segmental and somatic dysfunction of cervical region: Secondary | ICD-10-CM | POA: Diagnosis not present

## 2020-12-25 DIAGNOSIS — M542 Cervicalgia: Secondary | ICD-10-CM | POA: Diagnosis not present

## 2020-12-25 DIAGNOSIS — M546 Pain in thoracic spine: Secondary | ICD-10-CM | POA: Diagnosis not present

## 2020-12-25 DIAGNOSIS — M9902 Segmental and somatic dysfunction of thoracic region: Secondary | ICD-10-CM | POA: Diagnosis not present

## 2021-01-13 ENCOUNTER — Ambulatory Visit: Payer: BC Managed Care – PPO | Admitting: Family Medicine

## 2021-01-15 DIAGNOSIS — M542 Cervicalgia: Secondary | ICD-10-CM | POA: Diagnosis not present

## 2021-01-15 DIAGNOSIS — M9902 Segmental and somatic dysfunction of thoracic region: Secondary | ICD-10-CM | POA: Diagnosis not present

## 2021-01-15 DIAGNOSIS — M546 Pain in thoracic spine: Secondary | ICD-10-CM | POA: Diagnosis not present

## 2021-01-15 DIAGNOSIS — M9901 Segmental and somatic dysfunction of cervical region: Secondary | ICD-10-CM | POA: Diagnosis not present

## 2021-02-05 ENCOUNTER — Encounter: Payer: Self-pay | Admitting: Family Medicine

## 2021-02-05 ENCOUNTER — Ambulatory Visit (INDEPENDENT_AMBULATORY_CARE_PROVIDER_SITE_OTHER): Payer: BC Managed Care – PPO | Admitting: Family Medicine

## 2021-02-05 ENCOUNTER — Other Ambulatory Visit: Payer: Self-pay

## 2021-02-05 VITALS — BP 132/86 | Temp 96.6°F | Wt 191.6 lb

## 2021-02-05 DIAGNOSIS — M9902 Segmental and somatic dysfunction of thoracic region: Secondary | ICD-10-CM | POA: Diagnosis not present

## 2021-02-05 DIAGNOSIS — S1096XA Insect bite of unspecified part of neck, initial encounter: Secondary | ICD-10-CM

## 2021-02-05 DIAGNOSIS — M546 Pain in thoracic spine: Secondary | ICD-10-CM | POA: Diagnosis not present

## 2021-02-05 DIAGNOSIS — E785 Hyperlipidemia, unspecified: Secondary | ICD-10-CM

## 2021-02-05 DIAGNOSIS — E782 Mixed hyperlipidemia: Secondary | ICD-10-CM

## 2021-02-05 DIAGNOSIS — M542 Cervicalgia: Secondary | ICD-10-CM | POA: Diagnosis not present

## 2021-02-05 DIAGNOSIS — W57XXXA Bitten or stung by nonvenomous insect and other nonvenomous arthropods, initial encounter: Secondary | ICD-10-CM | POA: Diagnosis not present

## 2021-02-05 DIAGNOSIS — M9901 Segmental and somatic dysfunction of cervical region: Secondary | ICD-10-CM | POA: Diagnosis not present

## 2021-02-05 NOTE — Patient Instructions (Signed)
Tick Bite Information, Adult Ticks are insects that draw blood for food. Most ticks live in shrubs and grassy and wooded areas. They climb onto people and animals that brush against the leaves and grasses that they rest on. Then they bite, attaching themselves to the skin. Most ticks are harmless, but some ticks may carry germs that can spread to a person through a bite and cause a disease. To reduce your risk of getting a disease from a tick bite, make sure you:  Take steps to prevent tick bites.  Check for ticks after being outdoors where ticks live.  Watch for symptoms of disease if a tick attached to you or if you suspect a tick bite. How can I prevent tick bites? Take these steps to help prevent tick bites when you go outdoors in an area where ticks live: Use insect repellent  Use insect repellent that has DEET (20% or higher), picaridin, or IR3535 in it. Follow the instructions on the label. Use these products on: ? Bare skin. ? The top of your boots. ? Your pant legs. ? Your sleeve cuffs.  For insect repellent that contains permethrin, follow the instructions on the label. Use these products on: ? Clothing. ? Boots. ? Outdoor gear. ? Tents. When you are outside  Wear protective clothing. Long sleeves and long pants offer the best protection from ticks.  Wear light-colored clothing so you can see ticks more easily.  Tuck your pant legs into your socks.  If you go walking on a trail, stay in the middle of the trail so your skin, hair, and clothing do not touch the bushes.  Avoid walking through areas with long grass.  Check for ticks on your clothing, hair, and skin often while you are outside, and check again before you go inside. Make sure to check the scalp, neck, armpits, waist, groin, and joint areas. These are the spots where ticks attach themselves most often. When you go indoors  Check your clothing for ticks. Tumble dry clothes in a dryer on high heat for at least  10 minutes. If clothes are damp, additional time may be needed. If clothes require washing, use hot water.  Examine gear and pets.  Shower soon after being outdoors.  Check your body for ticks. Conduct a full body check using a mirror. What is the proper way to remove a tick? If you find a tick on your body, remove it as soon as possible. Removing a tick sooner can prevent germs from passing to your body. Do not remove the tick with your bare fingers. To remove a tick that is crawling on your skin but has not bitten, use either of these methods:  Go outdoors and brush the tick off.  Remove the tick with tape or a lint roller. To remove a tick that is attached to your skin: 1. Wash your hands. If you have latex gloves, put them on. 2. Use fine-tipped tweezers, curved forceps, or a tick-removal tool to gently grasp the tick as close to your skin and the tick's head as possible. 3. Gently pull with a steady, upward, even pressure until the tick lets go. 4. When removing the tick: ? Take care to keep the tick's head attached to its body. ? Do not twist or jerk the tick. This can make the tick's head or mouth parts break off and remain in the skin. ? Do not squeeze or crush the tick's body. This could force disease-carrying fluids from the tick   into your body. Do not try to remove a tick with heat, alcohol, petroleum jelly, or fingernail polish. Using these methods can cause the tick to salivate and regurgitate into your bloodstream, increasing your risk of getting a disease.   What should I do after removing a tick?  Dispose of the tick. Do not crush a tick with your fingers.  Clean the bite area and your hands with soap and water, rubbing alcohol, or an iodine scrub.  If an antiseptic cream or ointment is available, apply a small amount to the bite site.  Wash and disinfect any instruments that you used to remove the tick. How should I dispose of a tick? To dispose of a live tick, use  one of these methods:  Place it in rubbing alcohol.  Place it in a sealed bag or container.  Wrap it tightly in tape.  Flush it down the toilet. Contact a health care provider if:  You have symptoms of a disease after a tick bite. Symptoms of a tick-borne disease can occur from moments after the tick bites to 30 days after a tick is removed. Symptoms include: ? Fever or chills. ? Any of these signs in the bite area:  A red rash that makes a circle (bull's-eye rash) in the bite area.  Redness and swelling. ? Headache. ? Muscle, joint, or bone pain. ? Abnormal tiredness. ? Numbness in your legs or difficulty walking or moving your legs. ? Tender, swollen lymph glands.  A part of a tick breaks off and gets stuck in your skin. Get help right away if:  You are not able to remove a tick.  You experience muscle weakness or paralysis.  Your symptoms get worse or you experience new symptoms.  You find an engorged tick on your skin and you are in an area where disease from ticks is a high risk. Summary  Ticks may carry germs that can spread to a person through a bite and cause a disease.  Wear protective clothing and use insect repellent to prevent tick bites. Follow the instructions on the label.  If you find a tick on your body, remove it as soon as possible. If the tick is attached, do not try to remove with heat, alcohol, petroleum jelly, or fingernail polish.  Remove the attached tick using fine-tipped tweezers, curved forceps, or a tick-removal tool. Gently pull with steady, upward, even pressure until the tick lets go. Do not twist or jerk the tick. Do not squeeze or crush the tick's body.  If you have symptoms of a disease after being bitten by a tick, contact a health care provider. This information is not intended to replace advice given to you by your health care provider. Make sure you discuss any questions you have with your health care provider. Document Revised:  09/23/2019 Document Reviewed: 09/23/2019 Elsevier Patient Education  2021 Elsevier Inc.  

## 2021-02-05 NOTE — Progress Notes (Signed)
   Subjective:    Patient ID: Nancy Robertson, female    DOB: 02-15-1966, 55 y.o.   MRN: [id number]  HPI Pt had tick bite yesterday afternoon on back side of neck. Pt states the tick did have one white spot on back. Pt states the area was oozing clear discharge yesterday. Area burns but no itch.   Moderate tick bite in the back of the neck no bull's-eye lesion did have some redness around the tick bite.  No fever body aches headache sweats or chills Review of Systems    See above Objective:   Physical Exam  Lungs clear heart regular erythematous area on the back of the neck consistent with a tick bite no bull's-eye lesion      Assessment & Plan:  Tick bite No sign of infection If any ongoing troubles or problems notify us Warning signs discussed in detail Recheck if progressive troubles or any issues Labs ordered and patient has history hyperlipidemia mildly overweight but she is working hard on healthy eating and regular physical activity and diet await labs

## 2021-02-09 DIAGNOSIS — E785 Hyperlipidemia, unspecified: Secondary | ICD-10-CM | POA: Diagnosis not present

## 2021-02-09 DIAGNOSIS — E782 Mixed hyperlipidemia: Secondary | ICD-10-CM | POA: Diagnosis not present

## 2021-02-09 DIAGNOSIS — E781 Pure hyperglyceridemia: Secondary | ICD-10-CM | POA: Diagnosis not present

## 2021-02-09 DIAGNOSIS — Z131 Encounter for screening for diabetes mellitus: Secondary | ICD-10-CM | POA: Diagnosis not present

## 2021-02-15 LAB — ALPHA-GAL PANEL
Allergen Lamb IgE: <0.1 kU/L
Beef IgE: <0.1 kU/L
IgE (Immunoglobulin E), Serum: 23 IU/mL (ref 6–495)
O215-IgE Alpha-Gal: <0.1 kU/L
Pork IgE: <0.1 kU/L

## 2021-02-15 LAB — LIPID PANEL
Chol/HDL Ratio: 5.5 ratio — ABNORMAL HIGH (ref 0.0–4.4)
Cholesterol, Total: 238 mg/dL — ABNORMAL HIGH (ref 100–199)
HDL: 43 mg/dL (ref >39)
LDL Chol Calc (NIH): 148 mg/dL — ABNORMAL HIGH (ref 0–99)
Triglycerides: 257 mg/dL — ABNORMAL HIGH (ref 0–149)
VLDL Cholesterol Cal: 47 mg/dL — ABNORMAL HIGH (ref 5–40)

## 2021-02-15 LAB — GLUCOSE, RANDOM: Glucose: 90 mg/dL (ref 65–99)

## 2021-02-24 DIAGNOSIS — M9901 Segmental and somatic dysfunction of cervical region: Secondary | ICD-10-CM | POA: Diagnosis not present

## 2021-02-24 DIAGNOSIS — M9902 Segmental and somatic dysfunction of thoracic region: Secondary | ICD-10-CM | POA: Diagnosis not present

## 2021-02-24 DIAGNOSIS — M546 Pain in thoracic spine: Secondary | ICD-10-CM | POA: Diagnosis not present

## 2021-02-24 DIAGNOSIS — M542 Cervicalgia: Secondary | ICD-10-CM | POA: Diagnosis not present

## 2021-02-26 DIAGNOSIS — M9902 Segmental and somatic dysfunction of thoracic region: Secondary | ICD-10-CM | POA: Diagnosis not present

## 2021-02-26 DIAGNOSIS — M542 Cervicalgia: Secondary | ICD-10-CM | POA: Diagnosis not present

## 2021-02-26 DIAGNOSIS — M546 Pain in thoracic spine: Secondary | ICD-10-CM | POA: Diagnosis not present

## 2021-02-26 DIAGNOSIS — M9901 Segmental and somatic dysfunction of cervical region: Secondary | ICD-10-CM | POA: Diagnosis not present

## 2021-03-12 DIAGNOSIS — M9902 Segmental and somatic dysfunction of thoracic region: Secondary | ICD-10-CM | POA: Diagnosis not present

## 2021-03-12 DIAGNOSIS — M546 Pain in thoracic spine: Secondary | ICD-10-CM | POA: Diagnosis not present

## 2021-03-12 DIAGNOSIS — M542 Cervicalgia: Secondary | ICD-10-CM | POA: Diagnosis not present

## 2021-03-12 DIAGNOSIS — M9901 Segmental and somatic dysfunction of cervical region: Secondary | ICD-10-CM | POA: Diagnosis not present

## 2021-03-29 ENCOUNTER — Ambulatory Visit: Payer: BC Managed Care – PPO | Admitting: Family Medicine

## 2021-04-05 ENCOUNTER — Telehealth: Payer: Self-pay | Admitting: Family Medicine

## 2021-04-05 NOTE — Telephone Encounter (Signed)
I do not feel that she needs to repeat any blood work for this visit.  We can discuss further lab work for later in the year at the time of her visit-thanks-Dr. Nicki Reaper

## 2021-04-05 NOTE — Telephone Encounter (Signed)
Patient is wanting to know if she needs labs for visit on 04/07/21

## 2021-04-05 NOTE — Telephone Encounter (Signed)
Last labs completed 02/07/21 glucose, lipid and alpha gal. Please advise. Thank you

## 2021-04-05 NOTE — Telephone Encounter (Signed)
Patient advised per Dr Nicki Reaper: do not feel that she needs to repeat any blood work for this visit.  We can discuss further lab work for later in the year at the time of her visit. Patient verbalized understanding.

## 2021-04-07 ENCOUNTER — Ambulatory Visit (INDEPENDENT_AMBULATORY_CARE_PROVIDER_SITE_OTHER): Payer: BC Managed Care – PPO | Admitting: Family Medicine

## 2021-04-07 ENCOUNTER — Other Ambulatory Visit: Payer: Self-pay

## 2021-04-07 VITALS — BP 114/69 | HR 64 | Temp 97.2°F | Ht 65.25 in | Wt 193.8 lb

## 2021-04-07 DIAGNOSIS — F411 Generalized anxiety disorder: Secondary | ICD-10-CM | POA: Diagnosis not present

## 2021-04-07 DIAGNOSIS — R221 Localized swelling, mass and lump, neck: Secondary | ICD-10-CM

## 2021-04-07 DIAGNOSIS — E782 Mixed hyperlipidemia: Secondary | ICD-10-CM | POA: Diagnosis not present

## 2021-04-07 DIAGNOSIS — E785 Hyperlipidemia, unspecified: Secondary | ICD-10-CM

## 2021-04-07 NOTE — Progress Notes (Signed)
   Subjective:    Patient ID: Nancy Robertson, female    DOB: 07-09-1966, 55 y.o.   MRN: [id number]  HPI Follow up on neck swelling, throat clearing, cough, dry, itchy eyes Patient having some allergy issues  Patient wanted to have a recheck of the area on her neck she was concerned about she states she does not feel it is changing any she has had a previous ultrasound as well as a CAT scan that did not reveal any type of abnormal size of lymph nodes  Patient is trying to watch her diet trying to lose some weight.  Recent cholesterol profile moderately elevated does not have high blood pressure her risk ratio comes out to be under 4%.  She was on cholesterol medicine previously but is no longer on cholesterol medicine.  She does find her self feeling stressed and under a lot of issues specifically regarding her son who has some difficulty with maturing as well as possibly some developmental issues Review of Systems     Objective:   Physical Exam Lungs are clear respiratory rate normal heart regular pulse normal weight is noted blood pressure rechecked good control On examination of the neck I do not find any evidence of any type of abnormal growth      Assessment & Plan:  1. Hyperlipidemia, unspecified hyperlipidemia type Healthy diet recommended hold off on statins currently follow-up lab work in 6 months time  2. GAD (generalized anxiety disorder) Trying Lexapro patient will give [place] follow-up within 4 weeks and then patient will do a follow-up office visit in 3 to 4 months follow-up sooner problems recommend counseling - escitalopram (LEXAPRO) 10 MG tablet; Take 10 mg by mouth daily. - Ambulatory referral to Psychiatry  3. Mixed hyperlipidemia See above  4. Neck swelling No abnormal lymphadenopathy noted.  I would not recommend any type of additional tests I do not feel the patient needs to be seen by ENT currently  Follow-up office visit 3 to 4 months  Healthy diet regular  activity recommended patient is doing a weight loss program through a chiropractor out of [facility].

## 2021-05-08 DIAGNOSIS — H04123 Dry eye syndrome of bilateral lacrimal glands: Secondary | ICD-10-CM | POA: Diagnosis not present

## 2021-05-08 DIAGNOSIS — H16223 Keratoconjunctivitis sicca, not specified as Sjogren's, bilateral: Secondary | ICD-10-CM | POA: Diagnosis not present

## 2021-05-28 DIAGNOSIS — M546 Pain in thoracic spine: Secondary | ICD-10-CM | POA: Diagnosis not present

## 2021-05-28 DIAGNOSIS — M542 Cervicalgia: Secondary | ICD-10-CM | POA: Diagnosis not present

## 2021-05-28 DIAGNOSIS — M9901 Segmental and somatic dysfunction of cervical region: Secondary | ICD-10-CM | POA: Diagnosis not present

## 2021-05-28 DIAGNOSIS — M9902 Segmental and somatic dysfunction of thoracic region: Secondary | ICD-10-CM | POA: Diagnosis not present

## 2021-06-18 DIAGNOSIS — M546 Pain in thoracic spine: Secondary | ICD-10-CM | POA: Diagnosis not present

## 2021-06-18 DIAGNOSIS — M9901 Segmental and somatic dysfunction of cervical region: Secondary | ICD-10-CM | POA: Diagnosis not present

## 2021-06-18 DIAGNOSIS — M542 Cervicalgia: Secondary | ICD-10-CM | POA: Diagnosis not present

## 2021-06-18 DIAGNOSIS — M9902 Segmental and somatic dysfunction of thoracic region: Secondary | ICD-10-CM | POA: Diagnosis not present

## 2021-06-28 ENCOUNTER — Other Ambulatory Visit: Payer: Self-pay

## 2021-06-28 ENCOUNTER — Ambulatory Visit (INDEPENDENT_AMBULATORY_CARE_PROVIDER_SITE_OTHER): Payer: BC Managed Care – PPO | Admitting: Psychiatry

## 2021-06-28 DIAGNOSIS — F411 Generalized anxiety disorder: Secondary | ICD-10-CM | POA: Diagnosis not present

## 2021-06-28 NOTE — Progress Notes (Signed)
[facility] Initial Adult Exam  Name: Nancy Robertson Date: 06/28/2021 MRN: [id number] DOB: 01-19-1966 PCP: Kathyrn Drown, MD  Time spent: 60 minutes   Guardian/Payee:  patient    Paperwork requested:  No   Reason for Visit /Presenting Problem:  anxiety, worries a lot, residual grief of father's death during Cov[place], job change, "was depressed but better with that", "can't turn things off in my mind", (was on Lexapro but stopped it due to GI problems), some irritability and I feel that anxiousness  Mental Status Exam:    Appearance:   Well Groomed     Behavior:  Appropriate, Sharing, and Motivated  Motor:  Normal  Speech/Language:   Clear and Coherent  Affect:  anxious  Mood:  anxious and irritable, some residual depression  Thought process:  goal directed  Thought content:    Some obsessiveness and overthinking  Sensory/Perceptual disturbances:    WNL  Orientation:  oriented to person, place, time/date, situation, day of week, month of year, year, and stated date of Sept. 19, 2022  Attention:  Good  Concentration:  Good  Memory:  WNL  Fund of knowledge:   Good  Insight:    Good  Judgment:   Good  Impulse Control:  Good   Reported Symptoms:  see symptoms above  Risk Assessment: Danger to Self:  No Self-injurious Behavior: No Danger to Others: No Duty to Warn:no Physical Aggression / Violence:No  Access to Firearms a concern: No  Gang Involvement:No  Patient / guardian was educated about steps to take if suicide or homicide risk level increases between visits: Denies any SI. While future psychiatric events cannot be accurately predicted, the patient does not currently require acute inpatient psychiatric care and does not currently meet [facility] involuntary commitment criteria.  Substance Abuse History: Current substance abuse: No     Past Psychiatric History:   Previous psychological history is significant for anxiety and saw therapist a few sessions  over 20 yrs ago Outpatient Providers:doen't recall therapist name History of Psych Hospitalization: No  Psychological Testing:  n/a    Abuse History: Victim of No.,  n/a    Report needed: No. Victim of Neglect:No. Perpetrator of  n/a   Witness / Exposure to Domestic Violence: No   Protective Services Involvement: No  Witness to [employer] Violence:  No   Family History: Patient reviewed and confirms info below. Family History  Problem Relation Age of Onset   Hypertension Father    Diabetes Father    Hyperlipidemia Father    Lung cancer Father    COPD Father    Stroke Father    Colon polyps Father    Breast cancer Mother    Bladder Cancer Paternal Aunt    Bladder Cancer Paternal Uncle    Colon cancer Neg Hx    Esophageal cancer Neg Hx    Liver disease Neg Hx    Pancreatic cancer Neg Hx     Living situation: the patient lives with their family, husband and son 81 yr old. Also has adult daughter age 28 and lives in [place], [place] with husband.  No grandkids. Good relationships with both adult kids.  Sexual Orientation:  Straight  Relationship Status: married 30 yrs, with 2 adult kids Name of spouse / other: N/A             If a parent, number of children / ages:see above  Support Systems; spouse friends Mom is 24 and in great health and is supportive.  Financial Stress:  No   Income/Employment/Disability: Employment  Armed forces logistics/support/administrative officer: No   Educational History: Education:  completed HS  Religion/Sprituality/World View:   Protestant, doesn't attend a church but Scientist, forensic and prayer are important to me"  Any cultural differences that may affect / interfere with treatment:  not applicable   Recreation/Hobbies: cooking, painting, being with family, being outdoors  Stressors:Other: "my weight concerns me at times, I'm over 200lbs", concerned about mom, issues from the past    Strengths:  Supportive Relationships, Family, Spirituality, Hopefulness, Self Advocate, and  Able to Communicate Effectively  Barriers:  myself, talking down to myself    Legal History: Pending legal issue / charges: The patient has no significant history of legal issues. History of legal issue / charges:  n/a  Medical History/Surgical History:Reviewed with patient and she confirms the info below. Past Medical History:  Diagnosis Date   Allergy    Asthma    seasonal only   Chest pain 04/17/2012   Dyspepsia 03/27/2019   GERD (gastroesophageal reflux disease)    Mixed hyperlipidemia 04/17/2012   Palpitations 04/17/2012   TMJ (dislocation of temporomandibular joint) 04/17/2012    Past Surgical History:  Procedure Laterality Date   BLADDER REPAIR  2010   BREAST BIOPSY Right    2020   COLONOSCOPY N/A 07/07/2016   Procedure: COLONOSCOPY;  Surgeon: Rogene Houston, MD;  Location[facility];  Service: Endoscopy;  Laterality: N/A;  1030    CYSTOSCOPY  11/10/2011   Procedure: CYSTOSCOPY;  Surgeon: Delice Lesch, MD;  Location: [facility];  Service: Gynecology;  Laterality: N/A;   KNEE SURGERY  2007   right   SALPINGOOPHORECTOMY  11/10/2011   Procedure: SALPINGO OOPHERECTOMY;  Surgeon: Delice Lesch, MD;  Location: [facility];  Service: Gynecology;  Laterality: Bilateral;   VAGINAL HYSTERECTOMY  11/10/2011   Procedure: HYSTERECTOMY VAGINAL;  Surgeon: Delice Lesch, MD;  Location: [facility];  Service: Gynecology;  Laterality: N/A;  Total Vaginal Hysterectomy, bilateral salpingo-oopherectomy, cystoscopy    Medications:Patient confirms info below. Current Outpatient Medications  Medication Sig Dispense Refill   budesonide-formoterol (SYMBICORT) 80-4.5 MCG/ACT inhaler Inhale 2 puffs into the lungs 2 (two) times daily. (Patient not taking: Reported on 04/07/2021) 3 each 1   cetirizine (ZYRTEC) 10 MG tablet Take 10 mg by mouth daily. (Patient not taking: Reported on 04/07/2021)     escitalopram (LEXAPRO) 10 MG tablet Take 10 mg by mouth daily.     OVER THE COUNTER MEDICATION Vit d3  otc med  for hot flashes     Polyethyl Glycol-Propyl Glycol (SYSTANE) 0.4-0.3 % GEL ophthalmic gel Place 1 application into both eyes.     No current facility-administered medications for this visit.    Allergies  Allergen Reactions   Azithromycin Nausea Only   Erythromycin Nausea And Vomiting   Qsymia [Phentermine-Topiramate] Other (See Comments)    Fuzzy thinking   Penicillins     Reaction from Childhood.    Diagnoses:    ICD-10-CM   1. Generalized anxiety disorder  F41.1      Treatment Goal Plan: Patient not signing Tx Plan on computer screen due to Covid. Treatment Goals: Treatment goals remain on treatment plan as patient works with strategies to achieve her her goal.  Progress is assessed each session and documented in the "progress" section of Tx note. Long term goal: Reduce overall level, frequency, and intensity of the anxiety so that daily functioning in not impaired. Short term goal: Verbalize an understanding of  the role that fearful thinking plays in creating fears, excessive worry, and persistent anxiety symptoms. Strategies: Discuss examples demonstrating that unrealistic fear or worry typically overestimates the probability of threats and underestimates the client's ability to manage realistic demands.     Plan of Care:  This is patient's first session today and we completed her initial evaluation and treatment goal plan collaboratively.She is a very pleasant 55 yr old married female, mother of 2 adult kids, both of whom are supportive.. Married for 38 yrs. her 42 year old son lives in the home with his parents and suffers from severe dyslexia, graduated in 2019-01-18 from private school, and has recently taken a job that he likes and "feels he is good at".  Patient has worried about this son and states "I am a fixer but I could not fix his situation".  Husband supportive of patient and they seem to have a healthy marriage.  Patient is employed in [employer] where most of her work  experience has occurred.  She states her dad's death in January 18, 2020, her mother-in-law's death in 01/18/2019, and a sister-in-law dying unexpectedly in 17-Jan-2018 of a sudden heart attack, have all taken its toll on her and she has had difficulty coping.  Reports that her mother is still living and is supportive as she is able, and her father is deceased.  Patient reports that she is coming for help due to her anxiety, worrying a lot, job changes, some depression "but I am a little better with that", residual grief over her father's death during [place] along with other family deaths mentioned above, "I cannot turn things off in my mind," some irritability, but mostly anxious.  Patient denies any SI.  Has been on Lexapro previously but states she discontinued it due to GI problems.  Patient referred per her physician Dr. Sallee Lange in [place].  Patient states she has been seen by therapist briefly over 20 years ago.  Patient's appearance is well groomed, behavior is appropriate and motivated, motor skills are normal, speech and language are clear and coherent, affect and mood are anxious with some residual depression, thought process is goal directed thought content includes some obsessiveness and overthinking, sensory within normal limits, well oriented to person/place/time/date/situation/day of week/month of year/year/and stated date of June 28, 2021.  Her attention and concentration are both good, memory within normal limits, fund of knowledge is good insight good, judgment good, and impulse control also good.  Denies any current substance abuse.  Patient reports that her primary support system involves her spouse, family, and friends.  She reports no financial stressors in the home.  She and husband both are employed.  She reports identifying as a Protestant as far as religion "but I do not attend church although devotions and prayer are very important to me".  Is concerned about some weight that she has gained as well as  other stressors mentioned in the initial part of this evaluation.  She sees her strengths as being family, spirituality, supportive relationships, a sense of hopefulness, being a good self advocate, and able to communicate effectively.  She admits that one of her barriers to getting better is herself as "I tend to talk down to myself".  Patient adds toward the end of our time today that "worry is my biggest concern along with my anxiety."  She adds that "I tend to be 1 that embraces change but I realize personal changes can be more challenging".  I encouraged her because I see motivation in her  as well as a willingness to change even if she feels it is not as strong as it used to be.  Worked with patient on her treatment goal plan that is mostly focused on anxiety and some depression.  She is to return again within approximately 2 weeks. For further information from patient's initial evaluation including extensive physical health information can be found in the sections above of this note.  Review of treatment goal plan with patient and she confirms.  Next appointment within 2 weeks.   Shanon Ace, LCSW

## 2021-06-30 DIAGNOSIS — M9902 Segmental and somatic dysfunction of thoracic region: Secondary | ICD-10-CM | POA: Diagnosis not present

## 2021-06-30 DIAGNOSIS — M9901 Segmental and somatic dysfunction of cervical region: Secondary | ICD-10-CM | POA: Diagnosis not present

## 2021-06-30 DIAGNOSIS — M546 Pain in thoracic spine: Secondary | ICD-10-CM | POA: Diagnosis not present

## 2021-06-30 DIAGNOSIS — M542 Cervicalgia: Secondary | ICD-10-CM | POA: Diagnosis not present

## 2021-07-02 ENCOUNTER — Encounter: Payer: Self-pay | Admitting: Nurse Practitioner

## 2021-07-02 ENCOUNTER — Ambulatory Visit (INDEPENDENT_AMBULATORY_CARE_PROVIDER_SITE_OTHER): Payer: BC Managed Care – PPO | Admitting: Nurse Practitioner

## 2021-07-02 ENCOUNTER — Other Ambulatory Visit: Payer: Self-pay

## 2021-07-02 VITALS — BP 114/76 | HR 89 | Temp 97.6°F | Ht 65.25 in | Wt 208.0 lb

## 2021-07-02 DIAGNOSIS — M542 Cervicalgia: Secondary | ICD-10-CM | POA: Diagnosis not present

## 2021-07-02 DIAGNOSIS — M9902 Segmental and somatic dysfunction of thoracic region: Secondary | ICD-10-CM | POA: Diagnosis not present

## 2021-07-02 DIAGNOSIS — M546 Pain in thoracic spine: Secondary | ICD-10-CM | POA: Diagnosis not present

## 2021-07-02 DIAGNOSIS — R350 Frequency of micturition: Secondary | ICD-10-CM | POA: Diagnosis not present

## 2021-07-02 DIAGNOSIS — F419 Anxiety disorder, unspecified: Secondary | ICD-10-CM

## 2021-07-02 DIAGNOSIS — J45901 Unspecified asthma with (acute) exacerbation: Secondary | ICD-10-CM

## 2021-07-02 DIAGNOSIS — M9901 Segmental and somatic dysfunction of cervical region: Secondary | ICD-10-CM | POA: Diagnosis not present

## 2021-07-02 DIAGNOSIS — K581 Irritable bowel syndrome with constipation: Secondary | ICD-10-CM

## 2021-07-02 DIAGNOSIS — Z23 Encounter for immunization: Secondary | ICD-10-CM

## 2021-07-02 DIAGNOSIS — K219 Gastro-esophageal reflux disease without esophagitis: Secondary | ICD-10-CM | POA: Diagnosis not present

## 2021-07-02 DIAGNOSIS — F32A Depression, unspecified: Secondary | ICD-10-CM

## 2021-07-02 LAB — POCT URINALYSIS DIP (MANUAL ENTRY)
Bilirubin, UA: NEGATIVE
Blood, UA: NEGATIVE
Glucose, UA: NEGATIVE mg/dL
Ketones, POC UA: NEGATIVE mg/dL
Leukocytes, UA: NEGATIVE
Nitrite, UA: NEGATIVE
Protein Ur, POC: NEGATIVE mg/dL
Spec Grav, UA: 1.01 (ref 1.010–1.025)
Urobilinogen, UA: 0.2 E.U./dL
pH, UA: 6.5 (ref 5.0–8.0)

## 2021-07-02 LAB — POCT HEMOGLOBIN: Hemoglobin: 13.2 g/dL (ref 11–14.6)

## 2021-07-02 MED ORDER — SUCRALFATE 1 GM/10ML PO SUSP: 1.0000 g | Freq: Three times a day (TID) | ORAL | 0 refills | Status: DC

## 2021-07-02 MED ORDER — NITROFURANTOIN MONOHYD MACRO 100 MG PO CAPS: 100.0000 mg | ORAL_CAPSULE | Freq: Two times a day (BID) | ORAL | 0 refills | Status: DC

## 2021-07-02 MED ORDER — SERTRALINE HCL 50 MG PO TABS: ORAL_TABLET | ORAL | 0 refills | Status: DC

## 2021-07-02 NOTE — Progress Notes (Signed)
Subjective:    Patient ID: Nancy Robertson, female    DOB: 1966-09-11, 55 y.o.   MRN: [id number]  Anxiety Presents for follow-up visit.   Has been off Lexapro for about 2 weeks. Still had anxiety symptoms while on medication. Has noticed more anxiety and depression since stopping Lexapro. Denies any suicidal or homicidal thoughts or ideations. Has had several personal stressors. Having urinary symptoms that began 3 days ago.  No dysuria during urination but a knifelike pain at the end of urination.  Urgency.  Frequency.  No fever or flank pain.  Has been drinking a large amount of cranberry juice and water.  No incontinence.  No history of recent UTI.  No vaginal discharge.  Married, same sexual partner.  Surgical history: Hysterectomy with BSO. Has also been having a flareup of constipation for at least 2 weeks.  Mucus colored stools.  No bleeding.  Complaints of bloating and abdominal discomfort.  Has also had a flareup of her acid reflux symptoms.  Drinks a large amount of caffeine, non-smoker.  No excessive NSAID use.  Has some leftover Protonix at home which has not helped.  Requesting to restart her sucralfate which has worked really good in the past when she has had severe reflux.  Is due for her screening colonoscopy. Has been struggling with her weight and has had significant weight gain since June.  States her diet has not been healthy.  Limited exercise.      Objective:   Physical Exam NAD.  Alert, oriented.  Mildly anxious affect.  Making good eye contact.  Dressed appropriately.  Thoughts logical coherent and relevant.  Thyroid nontender to palpation, no mass or goiter noted.  Lungs clear.  Heart regular rate rhythm.  Abdomen soft nondistended with active bowel sounds x4; minimal lower abdominal tenderness, mild upper mid epigastric area and slightly to the left.  No obvious masses noted.  No rebound or guarding.  No flank tenderness. Results for orders placed or performed in visit on  07/02/21  POCT urinalysis dipstick  Result Value Ref Range   Color, UA yellow yellow   Clarity, UA clear clear   Glucose, UA negative negative mg/dL   Bilirubin, UA negative negative   Ketones, POC UA negative negative mg/dL   Spec Grav, UA 1.010 1.010 - 1.025   Blood, UA negative negative   pH, UA 6.5 5.0 - 8.0   Protein Ur, POC negative negative mg/dL   Urobilinogen, UA 0.2 0.2 or 1.0 E.U./dL   Nitrite, UA Negative Negative   Leukocytes, UA Negative Negative  POCT hemoglobin  Result Value Ref Range   Hemoglobin 13.2 11 - 14.6 g/dL   Urine micro negative.  Today's Vitals   07/02/21 1343  BP: 114/76  Pulse: 89  Temp: 97.6 F (36.4 C)  SpO2: 98%  Weight: 208 lb (94.3 kg)  Height: 5' 5.25" (1.657 m)   Body mass index is 34.35 kg/m.       Assessment & Plan:   Problem List Items Addressed This Visit       Digestive   Gastroesophageal reflux disease without esophagitis   Relevant Medications   sucralfate (CARAFATE) 1 GM/10ML suspension   Other Relevant Orders   POCT hemoglobin (Completed)   Irritable bowel syndrome with constipation   Relevant Medications   sucralfate (CARAFATE) 1 GM/10ML suspension     Other   Anxiety and depression - Primary   Relevant Medications   sertraline (ZOLOFT) 50 MG tablet   Other  Visit Diagnoses     Encounter for immunization       Relevant Orders   Flu Vaccine QUAD 39mo+IM (Fluarix, Fluzone & Alfiuria Quad PF) (Completed)   Urinary frequency       Relevant Orders   POCT urinalysis dipstick (Completed)   Urine Culture      Meds ordered this encounter  Medications   nitrofurantoin, macrocrystal-monohydrate, (MACROBID) 100 MG capsule    Sig: Take 1 capsule (100 mg total) by mouth 2 (two) times daily.    Dispense:  14 capsule    Refill:  0    Order Specific Question:   Supervising Provider    Answer:   Sallee Lange A [[id number]]   sucralfate (CARAFATE) 1 GM/10ML suspension    Sig: Take 10 mLs (1 g total) by mouth 4 (four)  times daily -  with meals and at bedtime. Prn acid reflux    Dispense:  420 mL    Refill:  0    Order Specific Question:   Supervising Provider    Answer:   Sallee Lange A [[id number]]   sertraline (ZOLOFT) 50 MG tablet    Sig: Take 1/2 tab po qd x 6 d then one po qd    Dispense:  30 tablet    Refill:  0    Order Specific Question:   Supervising Provider    Answer:   Sallee Lange A [[id number]]   Continue pantoprazole as directed.  Add sucralfate as directed as needed.  Discussed lifestyle factors affecting her reflux symptoms.  Encourage patient to decrease her caffeine intake. Discussed healthy diet and increasing activity. Start Zoloft as directed.  Reviewed potential adverse effects.  Discontinue medication and contact the office if any problems. Start Macrobid as directed.  Urine culture pending.  Further instructions based on results. Given samples on MiraLAX to take as directed daily.  Also discussed increasing fiber in her diet. Will refer her to gastroenterology for evaluation of reflux as well as her repeat screening colonoscopy.  Patient to call back to the office in the meantime if needed. Reminded patient about wellness exam due this fall.

## 2021-07-03 ENCOUNTER — Encounter: Payer: Self-pay | Admitting: Nurse Practitioner

## 2021-07-03 DIAGNOSIS — K581 Irritable bowel syndrome with constipation: Secondary | ICD-10-CM | POA: Insufficient documentation

## 2021-07-06 ENCOUNTER — Encounter (INDEPENDENT_AMBULATORY_CARE_PROVIDER_SITE_OTHER): Payer: Self-pay | Admitting: *Deleted

## 2021-07-07 ENCOUNTER — Encounter: Payer: Self-pay | Admitting: Nurse Practitioner

## 2021-07-07 ENCOUNTER — Other Ambulatory Visit: Payer: Self-pay | Admitting: Nurse Practitioner

## 2021-07-07 DIAGNOSIS — K219 Gastro-esophageal reflux disease without esophagitis: Secondary | ICD-10-CM

## 2021-07-07 DIAGNOSIS — K581 Irritable bowel syndrome with constipation: Secondary | ICD-10-CM

## 2021-07-07 DIAGNOSIS — Z1211 Encounter for screening for malignant neoplasm of colon: Secondary | ICD-10-CM

## 2021-07-07 LAB — URINE CULTURE

## 2021-07-09 DIAGNOSIS — M9902 Segmental and somatic dysfunction of thoracic region: Secondary | ICD-10-CM | POA: Diagnosis not present

## 2021-07-09 DIAGNOSIS — M542 Cervicalgia: Secondary | ICD-10-CM | POA: Diagnosis not present

## 2021-07-09 DIAGNOSIS — M9901 Segmental and somatic dysfunction of cervical region: Secondary | ICD-10-CM | POA: Diagnosis not present

## 2021-07-09 DIAGNOSIS — M546 Pain in thoracic spine: Secondary | ICD-10-CM | POA: Diagnosis not present

## 2021-07-19 ENCOUNTER — Encounter: Payer: Self-pay | Admitting: Gastroenterology

## 2021-07-23 ENCOUNTER — Encounter: Payer: Self-pay | Admitting: Family Medicine

## 2021-07-23 ENCOUNTER — Ambulatory Visit (INDEPENDENT_AMBULATORY_CARE_PROVIDER_SITE_OTHER): Payer: BC Managed Care – PPO | Admitting: Family Medicine

## 2021-07-23 ENCOUNTER — Telehealth: Payer: Self-pay | Admitting: Family Medicine

## 2021-07-23 ENCOUNTER — Other Ambulatory Visit: Payer: Self-pay

## 2021-07-23 VITALS — BP 128/78 | Temp 97.3°F | Wt 210.0 lb

## 2021-07-23 DIAGNOSIS — Z23 Encounter for immunization: Secondary | ICD-10-CM | POA: Diagnosis not present

## 2021-07-23 DIAGNOSIS — W5501XA Bitten by cat, initial encounter: Secondary | ICD-10-CM

## 2021-07-23 DIAGNOSIS — L03011 Cellulitis of right finger: Secondary | ICD-10-CM

## 2021-07-23 DIAGNOSIS — Z889 Allergy status to unspecified drugs, medicaments and biological substances status: Secondary | ICD-10-CM

## 2021-07-23 MED ORDER — SULFAMETHOXAZOLE-TRIMETHOPRIM 800-160 MG PO TABS: 1.0000 | ORAL_TABLET | Freq: Two times a day (BID) | ORAL | 0 refills | Status: DC

## 2021-07-23 NOTE — Telephone Encounter (Signed)
Patient got bit by a stray cat last night that broke the skin and was told by animal control to call primary care doctor please advise

## 2021-07-23 NOTE — Telephone Encounter (Signed)
Pt contacted and verbalized understanding. Pt will be here at 3:45

## 2021-07-23 NOTE — Telephone Encounter (Signed)
Per PCP; may have work in appt today. Pt has been  notified.

## 2021-07-23 NOTE — Progress Notes (Signed)
   Subjective:    Patient ID: Nancy Robertson, female    DOB: [birth year], 55 y.o.   MRN: [id number]  HPI Pt was bitten by kitten yesterday. Stray kitten came up and pt was rubbing face and the kitten bit her right index finger. Felt like a needle prick but then finger began to bleed a lot. Animal control has been contacted and animal control advised pt to contact PCP. Patient sustained cat bite status of rabies unknown, animal control is monitoring the cat for at least 10 days  Review of Systems     Objective:   Physical Exam Puncture wounds noted soreness around the puncture wounds noticed on the finger no cellulitis or abscess currently Hand appears normal       Assessment & Plan:  Cat bite Warm compresses Augmentin contraindicated because of penicillin allergy Bactrim DS twice daily for 10 days Tdap Follow-up ongoing troubles Animal control watching

## 2021-07-23 NOTE — Patient Instructions (Signed)

## 2021-07-23 NOTE — Telephone Encounter (Signed)
Front Please touch base with Nancy Robertson She sustained a cat bite We will need to work her in 3:45 PM as a work in, if need be 4:20 PM Please call her -if there is some sort of conflict please let me know

## 2021-07-27 ENCOUNTER — Other Ambulatory Visit: Payer: Self-pay

## 2021-07-27 ENCOUNTER — Ambulatory Visit (INDEPENDENT_AMBULATORY_CARE_PROVIDER_SITE_OTHER): Payer: BC Managed Care – PPO | Admitting: Psychiatry

## 2021-07-27 DIAGNOSIS — F411 Generalized anxiety disorder: Secondary | ICD-10-CM

## 2021-07-27 NOTE — Progress Notes (Signed)
[facility] Counselor/Therapist Progress Note  Patient ID: Nancy Robertson, MRN: [id number],    Date: 07/27/2021  Time Spent: 60 minutes   Treatment Type: Individual Therapy  Reported Symptoms: anxiety  Mental Status Exam:  Appearance:   Well Groomed     Behavior:  Appropriate, Sharing, and Motivated  Motor:  Normal  Speech/Language:   Clear and Coherent  Affect:  anxious  Mood:  anxious  Thought process:  goal directed  Thought content:    WNL  Sensory/Perceptual disturbances:    WNL  Orientation:  oriented to person, place, time/date, situation, day of week, month of year, year, and stated date of Oct. 18, 2022  Attention:  Good  Concentration:  Good  Memory:  WNL  Fund of knowledge:   Good  Insight:    Good  Judgment:   Good  Impulse Control:  Good   Risk Assessment: Danger to Self:  No Self-injurious Behavior: No Danger to Others: No Duty to Warn:no Physical Aggression / Violence:No  Access to Firearms a concern: No  Gang Involvement:No   Subjective:  Patient in today reporting anxiety, worrying, "but trying to be more mindful and shift from worrying to being a person of concern and not assume the worst." Working with fears and unresolved grief with 3 family deaths close together, especially her dad's death that was sudden and she had felt so close to him. Paying attention to "triggers" to her worrying and confront them, not letting them control her and feed her anxiety.  Tearfully sharing some of her grief and difficulties of accepting "things that are out of my control".  Also struggling some with her adult son that lives with her and husband and has some special needs.  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  Seemed to relate strongly to this and felt helped by the discussion today.  More calm and grounded by end of session and also seeming more optimistic for herself.  Interventions: Solution-Oriented/Positive Psychology, Ego-Supportive, and Insight-Oriented  Diagnosis:   ICD-10-CM   1. Generalized anxiety disorder  F41.1      Treatment Goal Plan: Patient not signing Tx Plan on computer screen due to Covid. Treatment Goals: Treatment goals remain on treatment plan as patient works with strategies to achieve her her goal.  Progress is assessed each session and documented in the "progress" section of Tx note. Long term goal: Reduce overall level, frequency, and intensity of the anxiety so that daily functioning in not impaired. Short term goal: Verbalize an understanding of the role that fearful thinking plays in creating fears, excessive worry, and persistent anxiety symptoms. Strategies: Discuss examples demonstrating that unrealistic fear or worry typically overestimates the probability of threats and underestimates the client's ability to manage realistic demands.      Plan:  Patient today showing good motivation and participation in session today as she worked on significant unresolved grief issues, anxiety, and self acceptance in her journey, as noted above.  Encouraged patient in practicing positive behaviors including: Let go of assuming worst case scenarios, focus on the present and what she can control or change, look for the positives within herself, refrain from self negating, getting outside daily,  consistent positive self talk, decrease self blame, intentionally look for more positives than negatives daily, decrease self doubt, reduce overthinking and over analyzing, recognizing and emphasizing her strengths, asking for what she needs as far as support, remaining on her prescribed medications, allowing her faith to be a resource in her emotional health as well as  spiritual health, and recognize the strengths she shows working with goal-directed behaviors to move in a direction that supports overall stability and improved emotional health.  Goal review and progress/challenges noted with patient.  Next appointment within 2 to 3 weeks.  This record has been created using [employer].  Chart creation errors have been sought, but may not always have been located and corrected.  Such creation errors do not reflect on the standard of medical care provided.    Shanon Ace, LCSW

## 2021-08-02 ENCOUNTER — Other Ambulatory Visit: Payer: Self-pay

## 2021-08-02 ENCOUNTER — Ambulatory Visit
Admission: EM | Admit: 2021-08-02 | Discharge: 2021-08-02 | Disposition: A | Discharge status: Home / Self Care | Payer: BC Managed Care – PPO | Attending: Urgent Care | Admitting: Urgent Care

## 2021-08-02 ENCOUNTER — Ambulatory Visit: Payer: Self-pay

## 2021-08-02 DIAGNOSIS — J018 Other acute sinusitis: Secondary | ICD-10-CM | POA: Diagnosis not present

## 2021-08-02 DIAGNOSIS — R519 Headache, unspecified: Secondary | ICD-10-CM | POA: Diagnosis not present

## 2021-08-02 DIAGNOSIS — J4530 Mild persistent asthma, uncomplicated: Secondary | ICD-10-CM

## 2021-08-02 DIAGNOSIS — J3089 Other allergic rhinitis: Secondary | ICD-10-CM

## 2021-08-02 DIAGNOSIS — R051 Acute cough: Secondary | ICD-10-CM | POA: Diagnosis not present

## 2021-08-02 MED ORDER — PREDNISONE 20 MG PO TABS: ORAL_TABLET | ORAL | 0 refills | Status: DC

## 2021-08-02 MED ORDER — BENZONATATE 100 MG PO CAPS: 100.0000 mg | ORAL_CAPSULE | Freq: Three times a day (TID) | ORAL | 0 refills | Status: DC | PRN

## 2021-08-02 MED ORDER — PROMETHAZINE-DM 6.25-15 MG/5ML PO SYRP: 5.0000 mL | ORAL_SOLUTION | Freq: Every evening | ORAL | 0 refills | Status: DC | PRN

## 2021-08-02 MED ORDER — DOXYCYCLINE HYCLATE 100 MG PO CAPS: 100.0000 mg | ORAL_CAPSULE | Freq: Two times a day (BID) | ORAL | 0 refills | Status: DC

## 2021-08-02 NOTE — ED Provider Notes (Signed)
[place]   MRN: [id number] DOB: 03-23-1966  Subjective:   Nancy Robertson is a 55 y.o. female presenting for 1 week history of persistent and worsening sinus congestion, sinus pressure, sinus headache, coughing.  Started having body aches today.  Husband is suspected to have the flu, was seen yesterday and prescribed Tamiflu empirically.  Patient denies any chest pain, shortness of breath or wheezing.  She is not opposed to testing.  She does have a difficult time with her allergies and asthma especially around this time a year.  She is using her albuterol and Symbicort as needed.  No current facility-administered medications for this encounter.  Current Outpatient Medications:    budesonide-formoterol (SYMBICORT) 80-4.5 MCG/ACT inhaler, Inhale 2 puffs into the lungs 2 (two) times daily., Disp: 3 each, Rfl: 1   cycloSPORINE (RESTASIS) 0.05 % ophthalmic emulsion, , Disp: , Rfl:    fexofenadine (ALLEGRA) 180 MG tablet, Take 180 mg by mouth daily., Disp: , Rfl:    OVER THE COUNTER MEDICATION, Vit d3  otc med for hot flashes, Disp: , Rfl:    sertraline (ZOLOFT) 50 MG tablet, Take 1/2 tab po qd x 6 d then one po qd, Disp: 30 tablet, Rfl: 0   sucralfate (CARAFATE) 1 GM/10ML suspension, Take 10 mLs (1 g total) by mouth 4 (four) times daily -  with meals and at bedtime. Prn acid reflux, Disp: 420 mL, Rfl: 0   sulfamethoxazole-trimethoprim (BACTRIM DS) 800-160 MG tablet, Take 1 tablet by mouth 2 (two) times daily., Disp: 20 tablet, Rfl: 0   Allergies  Allergen Reactions   Azithromycin Nausea Only   Erythromycin Nausea And Vomiting   Qsymia [Phentermine-Topiramate] Other (See Comments)    Fuzzy thinking   Penicillins     Reaction from Childhood.    Past Medical History:  Diagnosis Date   Allergy    Asthma    seasonal only   Chest pain 04/17/2012   Dyspepsia 03/27/2019   GERD (gastroesophageal reflux disease)    Mixed hyperlipidemia 04/17/2012   Palpitations 04/17/2012   TMJ  (dislocation of temporomandibular joint) 04/17/2012     Past Surgical History:  Procedure Laterality Date   BLADDER REPAIR  2010   BREAST BIOPSY Right    2020   COLONOSCOPY N/A 07/07/2016   Procedure: COLONOSCOPY;  Surgeon: Rogene Houston, MD;  Location: [facility];  Service: Endoscopy;  Laterality: N/A;  1030    CYSTOSCOPY  11/10/2011   Procedure: CYSTOSCOPY;  Surgeon: Delice Lesch, MD;  Location: [facility];  Service: Gynecology;  Laterality: N/A;   KNEE SURGERY  2007   right   SALPINGOOPHORECTOMY  11/10/2011   Procedure: SALPINGO OOPHERECTOMY;  Surgeon: Delice Lesch, MD;  Location: [facility];  Service: Gynecology;  Laterality: Bilateral;   VAGINAL HYSTERECTOMY  11/10/2011   Procedure: HYSTERECTOMY VAGINAL;  Surgeon: Delice Lesch, MD;  Location: [facility];  Service: Gynecology;  Laterality: N/A;  Total Vaginal Hysterectomy, bilateral salpingo-oopherectomy, cystoscopy    Family History  Problem Relation Age of Onset   Hypertension Father    Diabetes Father    Hyperlipidemia Father    Lung cancer Father    COPD Father    Stroke Father    Colon polyps Father    Breast cancer Mother    Bladder Cancer Paternal Aunt    Bladder Cancer Paternal Uncle    Colon cancer Neg Hx    Esophageal cancer Neg Hx    Liver disease Neg Hx  Pancreatic cancer Neg Hx     Social History   Tobacco Use   Smoking status: Never   Smokeless tobacco: Never  Vaping Use   Vaping Use: Never used  Substance Use Topics   Alcohol use: Yes    Comment: occasionally wine    Drug use: No    ROS   Objective:   Vitals: BP 125/85   Pulse 100   Temp 100.2 F (37.9 C)   Resp 19   LMP 11/04/2011   SpO2 95%   Physical Exam Constitutional:      General: She is not in acute distress.    Appearance: Normal appearance. She is well-developed. She is not ill-appearing, toxic-appearing or diaphoretic.  HENT:     Head: Normocephalic and atraumatic.     Right Ear: Tympanic membrane, ear canal and  external ear normal. No drainage or tenderness. No middle ear effusion. Tympanic membrane is not erythematous.     Left Ear: Tympanic membrane, ear canal and external ear normal. No drainage or tenderness.  No middle ear effusion. Tympanic membrane is not erythematous.     Nose: Nose normal. No congestion or rhinorrhea.     Mouth/Throat:     Mouth: Mucous membranes are moist. No oral lesions.     Pharynx: No pharyngeal swelling, oropharyngeal exudate, posterior oropharyngeal erythema or uvula swelling.     Tonsils: No tonsillar exudate or tonsillar abscesses.  Eyes:     General: No scleral icterus.       Right eye: No discharge.        Left eye: No discharge.     Extraocular Movements: Extraocular movements intact.     Right eye: Normal extraocular motion.     Left eye: Normal extraocular motion.     Conjunctiva/sclera: Conjunctivae normal.     Pupils: Pupils are equal, round, and reactive to light.  Cardiovascular:     Rate and Rhythm: Normal rate and regular rhythm.     Pulses: Normal pulses.     Heart sounds: Normal heart sounds. No murmur heard.   No friction rub. No gallop.  Pulmonary:     Effort: Pulmonary effort is normal. No respiratory distress.     Breath sounds: Normal breath sounds. No stridor. No wheezing, rhonchi or rales.  Musculoskeletal:     Cervical back: Normal range of motion and neck supple.  Lymphadenopathy:     Cervical: No cervical adenopathy.  Skin:    General: Skin is warm and dry.     Findings: No rash.  Neurological:     General: No focal deficit present.     Mental Status: She is alert and oriented to person, place, and time.  Psychiatric:        Mood and Affect: Mood normal.        Behavior: Behavior normal.        Thought Content: Thought content normal.    Assessment and Plan :   PDMP not reviewed this encounter.  1. Acute non-recurrent sinusitis of other sinus   2. Allergic rhinitis due to other allergic trigger, unspecified seasonality    3. Mild persistent asthma without complication   4. Acute cough   5. Sinus headache    Will start empiric treatment for sinusitis with doxycycline given her medication allergies.  We will also be using an oral prednisone course in light of her asthma, allergic rhinitis.  Recommended supportive care otherwise including the use of oral antihistamine.  COVID-19 testing and flu testing are  pending.  Given the timeline, do not believe the patient would benefit from using Tamiflu.  She was in agreement.  Counseled patient on potential for adverse effects with medications prescribed/recommended today, ER and return-to-clinic precautions discussed, patient verbalized understanding.    Jaynee Eagles, PA-C 08/02/21 1759

## 2021-08-02 NOTE — ED Triage Notes (Signed)
Pt presents with complaints of having nasal congestion and cough for a week. All of a sudden today she developed a headache, body aches, and feeling generally ill. Husband has suspected flu.

## 2021-08-03 ENCOUNTER — Ambulatory Visit (INDEPENDENT_AMBULATORY_CARE_PROVIDER_SITE_OTHER): Payer: BC Managed Care – PPO | Admitting: Family Medicine

## 2021-08-03 ENCOUNTER — Encounter: Payer: Self-pay | Admitting: Family Medicine

## 2021-08-03 DIAGNOSIS — U071 COVID-19: Secondary | ICD-10-CM | POA: Diagnosis not present

## 2021-08-03 LAB — COVID-19, FLU A+B NAA
Influenza A, NAA: NOT DETECTED
Influenza B, NAA: NOT DETECTED
SARS-CoV-2, NAA: DETECTED — AB

## 2021-08-03 MED ORDER — NIRMATRELVIR/RITONAVIR (PAXLOVID)TABLET: 3.0000 | ORAL_TABLET | Freq: Two times a day (BID) | ORAL | 0 refills | Status: AC

## 2021-08-03 MED ORDER — ALBUTEROL SULFATE HFA 108 (90 BASE) MCG/ACT IN AERS: 2.0000 | INHALATION_SPRAY | Freq: Four times a day (QID) | RESPIRATORY_TRACT | 2 refills | Status: DC | PRN

## 2021-08-03 NOTE — Progress Notes (Signed)
   Subjective:    Patient ID: Nancy Robertson, female    DOB: 05-01-1966, 55 y.o.   MRN: [id number]  HPI  Patient presents today with respiratory illness Number of days present-Yesterday 08/02/21  Symptoms include- headache, cough, fever, drainage  Presence of worrisome signs (severe shortness of breath, lethargy, etc.) - feels like her asthma is changing   Recent/current visit to urgent care or ER- 08/02/21 Urgent Care  Recent direct exposure to Covid- yes  Any current Covid testing- yes (at home test positive; Urgent Care unknown result at this time)  Virtual Visit via Telephone Note  I connected with Nancy Robertson on 08/03/21 at  4:50 PM EDT by telephone and verified that I am speaking with the correct person using two identifiers.  Location: Patient: home Provider: office   I discussed the limitations, risks, security and privacy concerns of performing an evaluation and management service by telephone and the availability of in person appointments. I also discussed with the patient that there may be a patient responsible charge related to this service. The patient expressed understanding and agreed to proceed.   History of Present Illness:    Observations/Objective:   Assessment and Plan:   Follow Up Instructions:    I discussed the assessment and treatment plan with the patient. The patient was provided an opportunity to ask questions and all were answered. The patient agreed with the plan and demonstrated an understanding of the instructions.   The patient was advised to call back or seek an in-person evaluation if the symptoms worsen or if the condition fails to improve as anticipated.  I provided  15 including documentation and discussion minutes of non-face-to-face time during this encounter.        Review of Systems     Objective:   Physical Exam  Today's visit was via telephone Physical exam was not possible for this visit       Assessment &  Plan:  COVID infection Warning signs discussed Stay away from work through this week Wear a mask for 5 days Shared discussion regarding medication With her history of asthma, weight issues, age recommend Paxlovid for the next 5 days kidney functions are good.  Side effects including benefits and risks were discussed

## 2021-08-05 ENCOUNTER — Encounter: Payer: Self-pay | Admitting: Family Medicine

## 2021-08-05 MED ORDER — DOXYCYCLINE HYCLATE 100 MG PO TABS: ORAL_TABLET | ORAL | 0 refills | Status: DC

## 2021-08-05 NOTE — Telephone Encounter (Signed)
Nurses-more than likely this is an extension of the COVID that she is having.  But there is a possibility of secondary bacterial infection.  It would be reasonable to cover with doxycycline for this possibility Doxycycline 100 mg 1 twice daily, 7 days She may have some remaining doxycycline from her urgent care visit but I would recommend going ahead and sending in the prescription thank you  Please have a knee monitor her symptoms and if they get worse to notify us thank you-Dr. Nicki Reaper

## 2021-08-05 NOTE — Addendum Note (Signed)
Addended by: Vicente Males on: 08/05/2021 02:24 PM   Modules accepted: Orders

## 2021-08-08 ENCOUNTER — Other Ambulatory Visit: Payer: Self-pay | Admitting: Nurse Practitioner

## 2021-08-09 ENCOUNTER — Ambulatory Visit: Payer: BC Managed Care – PPO | Admitting: Psychiatry

## 2021-08-20 ENCOUNTER — Other Ambulatory Visit: Payer: Self-pay | Admitting: Nurse Practitioner

## 2021-08-20 ENCOUNTER — Ambulatory Visit (INDEPENDENT_AMBULATORY_CARE_PROVIDER_SITE_OTHER): Payer: BC Managed Care – PPO | Admitting: Gastroenterology

## 2021-08-20 ENCOUNTER — Encounter: Payer: Self-pay | Admitting: Gastroenterology

## 2021-08-20 ENCOUNTER — Ambulatory Visit (INDEPENDENT_AMBULATORY_CARE_PROVIDER_SITE_OTHER): Payer: BC Managed Care – PPO | Admitting: Nurse Practitioner

## 2021-08-20 ENCOUNTER — Encounter: Payer: Self-pay | Admitting: Nurse Practitioner

## 2021-08-20 ENCOUNTER — Other Ambulatory Visit: Payer: Self-pay

## 2021-08-20 VITALS — BP 122/79 | HR 98 | Ht 67.0 in | Wt 213.0 lb

## 2021-08-20 VITALS — BP 116/70 | HR 85 | Ht 67.0 in | Wt 213.2 lb

## 2021-08-20 DIAGNOSIS — R1013 Epigastric pain: Secondary | ICD-10-CM | POA: Diagnosis not present

## 2021-08-20 DIAGNOSIS — E782 Mixed hyperlipidemia: Secondary | ICD-10-CM

## 2021-08-20 DIAGNOSIS — E781 Pure hyperglyceridemia: Secondary | ICD-10-CM | POA: Diagnosis not present

## 2021-08-20 DIAGNOSIS — M26609 Unspecified temporomandibular joint disorder, unspecified side: Secondary | ICD-10-CM | POA: Diagnosis not present

## 2021-08-20 DIAGNOSIS — Z79899 Other long term (current) drug therapy: Secondary | ICD-10-CM

## 2021-08-20 DIAGNOSIS — K5904 Chronic idiopathic constipation: Secondary | ICD-10-CM | POA: Diagnosis not present

## 2021-08-20 DIAGNOSIS — R1011 Right upper quadrant pain: Secondary | ICD-10-CM

## 2021-08-20 DIAGNOSIS — H73892 Other specified disorders of tympanic membrane, left ear: Secondary | ICD-10-CM

## 2021-08-20 DIAGNOSIS — K219 Gastro-esophageal reflux disease without esophagitis: Secondary | ICD-10-CM | POA: Diagnosis not present

## 2021-08-20 DIAGNOSIS — R140 Abdominal distension (gaseous): Secondary | ICD-10-CM

## 2021-08-20 DIAGNOSIS — U099 Post covid-19 condition, unspecified: Secondary | ICD-10-CM

## 2021-08-20 MED ORDER — NA SULFATE-K SULFATE-MG SULF 17.5-3.13-1.6 GM/177ML PO SOLN: ORAL | 0 refills | Status: DC

## 2021-08-20 MED ORDER — ROSUVASTATIN CALCIUM 10 MG PO TABS: 10.0000 mg | ORAL_TABLET | Freq: Every day | ORAL | 0 refills | Status: DC

## 2021-08-20 MED ORDER — SUCRALFATE 1 GM/10ML PO SUSP: 1.0000 g | Freq: Three times a day (TID) | ORAL | 0 refills | Status: DC

## 2021-08-20 NOTE — Progress Notes (Signed)
Subjective:    Patient ID: Nancy Robertson, female    DOB: [birth year], 55 y.o.   MRN: [id number]  HPI  Left side ear and jaw pain follow up started w/ Covid infection in October  Still having lingering fullness, throbbing , and soreness in ear / jaw area L Has a long history of TMJ bilaterally including loud popping and clicking.  Symptoms started with severe pain in the bilateral jaw area with a headache associated with COVID.  Right side has resolved.  Left TMJ issues are slightly better.  Denies any other joint pain involvement.  Itching noted in the left ear. Has seen her GI specialist.  Has colonoscopy and endoscopy scheduled.  She was told that her liver appears to be enlarged on examination.  She has an ultrasound of the right upper quadrant scheduled for possible fatty liver.  It was noted that her LDL triglycerides have been elevated on her lab work.  States her mother and brother have similar issues and are on medication.  No fever at this time.  Continues to have occasional cough with occasional mild shortness of breath with activity.  States she is slowly getting better. Has been getting regular massages with local massage therapist, missed her last appointment. Has had a previous history of a murmur when pregnant with her first child.     Objective:   Physical Exam NAD.  Alert, oriented.  Right TM mild clear effusion.  Left TM very retracted, no erythema.  Pharynx clear moist.  Neck supple with mild soft anterior adenopathy.  Tenderness noted in the left TMJ area with palpation.  Also lateral muscles in the neck are very tight more on the left side going into the left trapezius.  Lungs clear.  Heart regular rate and rhythm.  Grade 2/6 faint early systolic murmur noted loudest at PMI. Today's Vitals   08/20/21 1136  BP: 122/79  Pulse: 98  SpO2: 97%  Weight: 213 lb (96.6 kg)  Height: 5\' 7"  (1.702 m)   Body mass index is 33.36 kg/m.        Assessment & Plan:   Problem  List Items Addressed This Visit       Musculoskeletal and Integument   TMJ dysfunction - Primary     Other   Hypertriglyceridemia   Relevant Medications   rosuvastatin (CRESTOR) 10 MG tablet   Other Relevant Orders   Lipid panel   Mixed hyperlipidemia   Relevant Medications   rosuvastatin (CRESTOR) 10 MG tablet   Other Relevant Orders   Lipid panel   Other Visit Diagnoses     High risk medication use       Relevant Orders   Hepatic function panel   Retracted ear drum, left       Post covid-19 condition, unspecified              Recommend OTC steroid nasal spray as directed for ear pressure. Recommend she restart massage therapy for TMJ and other muscle spasms.  Consider use of a TENS unit. Will avoid OTC NSAIDs due to her reflux issues.  Continue Tylenol as directed. Reviewed recent lipid profiles with patient.  Due to family history and recent concerns about possible fatty liver, patient wants to go ahead and start statin. Meds ordered this encounter  Medications   rosuvastatin (CRESTOR) 10 MG tablet    Sig: Take 1 tablet (10 mg total) by mouth daily. For cholesterol    Dispense:  90 tablet  Refill:  0    Order Specific Question:   Supervising Provider    Answer:   Sallee Lange A [id number]   Start rosuvastatin as directed.  Repeat lipid and liver profiles in 8 weeks.  Discontinue medication and contact office if any significant adverse effects. Expect continued gradual resolution of post-COVID symptoms.  Call back if worsens or persist.

## 2021-08-20 NOTE — Patient Instructions (Signed)
You have been scheduled for an abdominal ultrasound/RUQ at [facility]  (1st floor of hospital) on 08/26/2021 at 12:30pm . Please arrive 15 minutes prior to your appointment for registration. Make certain not to have anything to eat or drink 6 hours prior to your appointment. Should you need to reschedule your appointment, please contact radiology at [phone]. This test typically takes about 30 minutes to perform.   You have been scheduled for an endoscopy and colonoscopy. Please follow the written instructions given to you at your visit today. Please pick up your prep supplies at the pharmacy within the next 1-3 days. If you use inhalers (even only as needed), please bring them with you on the day of your procedure.   Continue Miralax 1 capful daily  Take Benefiber 1 tablespoon twice a day  We will refill carafate for you  AVOID Alcohol, sugar, and Fruit Juice/Soda    Gastroesophageal Reflux Disease, Adult Gastroesophageal reflux (GER) happens when acid from the stomach flows up into the tube that connects the mouth and the stomach (esophagus). Normally, food travels down the esophagus and stays in the stomach to be digested. However, when a person has GER, food and stomach acid sometimes move back up into the esophagus. If this becomes a more serious problem, the person may be diagnosed with a disease called gastroesophageal reflux disease (GERD). GERD occurs when the reflux: Happens often. Causes frequent or severe symptoms. Causes problems such as damage to the esophagus. When stomach acid comes in contact with the esophagus, the acid may cause inflammation in the esophagus. Over time, GERD may create small holes (ulcers) in the lining of the esophagus. What are the causes? This condition is caused by a problem with the muscle between the esophagus and the stomach (lower esophageal sphincter, or LES). Normally, the LES muscle closes after food passes through the  esophagus to the stomach. When the LES is weakened or abnormal, it does not close properly, and that allows food and stomach acid to go back up into the esophagus. The LES can be weakened by certain dietary substances, medicines, and medical conditions, including: Tobacco use. Pregnancy. Having a hiatal hernia. Alcohol use. Certain foods and beverages, such as coffee, chocolate, onions, and peppermint. What increases the risk? You are more likely to develop this condition if you: Have an increased body weight. Have a connective tissue disorder. Take NSAIDs, such as ibuprofen. What are the signs or symptoms? Symptoms of this condition include: Heartburn. Difficult or painful swallowing and the feeling of having a lump in the throat. A bitter taste in the mouth. Bad breath and having a large amount of saliva. Having an upset or bloated stomach and belching. Chest pain. Different conditions can cause chest pain. Make sure you see your health care provider if you experience chest pain. Shortness of breath or wheezing. Ongoing (chronic) cough or a nighttime cough. Wearing away of tooth enamel. Weight loss. How is this diagnosed? This condition may be diagnosed based on a medical history and a physical exam. To determine if you have mild or severe GERD, your health care provider may also monitor how you respond to treatment. You may also have tests, including: A test to examine your stomach and esophagus with a small camera (endoscopy). A test that measures the acidity level in your esophagus. A test that measures how much pressure is on your esophagus. A barium swallow or modified barium swallow test to show the shape, size, and functioning of your esophagus.  How is this treated? Treatment for this condition may vary depending on how severe your symptoms are. Your health care provider may recommend: Changes to your diet. Medicine. Surgery. The goal of treatment is to help relieve your  symptoms and to prevent complications. Follow these instructions at home: Eating and drinking  Follow a diet as recommended by your health care provider. This may involve avoiding foods and drinks such as: Coffee and tea, with or without caffeine. Drinks that contain alcohol. Energy drinks and sports drinks. Carbonated drinks or sodas. Chocolate and cocoa. Peppermint and mint flavorings. Garlic and onions. Horseradish. Spicy and acidic foods, including peppers, chili powder, curry powder, vinegar, hot sauces, and barbecue sauce. Citrus fruit juices and citrus fruits, such as oranges, lemons, and limes. Tomato-based foods, such as red sauce, chili, salsa, and pizza with red sauce. Fried and fatty foods, such as donuts, french fries, potato chips, and high-fat dressings. High-fat meats, such as hot dogs and fatty cuts of red and white meats, such as rib eye steak, sausage, ham, and bacon. High-fat dairy items, such as whole milk, butter, and cream cheese. Eat small, frequent meals instead of large meals. Avoid drinking large amounts of liquid with your meals. Avoid eating meals during the 2-3 hours before bedtime. Avoid lying down right after you eat. Do not exercise right after you eat. Lifestyle  Do not use any products that contain nicotine or tobacco. These products include cigarettes, chewing tobacco, and vaping devices, such as e-cigarettes. If you need help quitting, ask your health care provider. Try to reduce your stress by using methods such as yoga or meditation. If you need help reducing stress, ask your health care provider. If you are overweight, reduce your weight to an amount that is healthy for you. Ask your health care provider for guidance about a safe weight loss goal. General instructions Pay attention to any changes in your symptoms. Take over-the-counter and prescription medicines only as told by your health care provider. Do not take aspirin, ibuprofen, or other  NSAIDs unless your health care provider told you to take these medicines. Wear loose-fitting clothing. Do not wear anything tight around your waist that causes pressure on your abdomen. Raise (elevate) the head of your bed about 6 inches (15 cm). You can use a wedge to do this. Avoid bending over if this makes your symptoms worse. Keep all follow-up visits. This is important. Contact a health care provider if: You have: New symptoms. Unexplained weight loss. Difficulty swallowing or it hurts to swallow. Wheezing or a persistent cough. A hoarse voice. Your symptoms do not improve with treatment. Get help right away if: You have sudden pain in your arms, neck, jaw, teeth, or back. You suddenly feel sweaty, dizzy, or light-headed. You have chest pain or shortness of breath. You vomit and the vomit is green, yellow, or black, or it looks like blood or coffee grounds. You faint. You have stool that is red, bloody, or black. You cannot swallow, drink, or eat. These symptoms may represent a serious problem that is an emergency. Do not wait to see if the symptoms will go away. Get medical help right away. Call your local emergency services (911 in the [place] Do not drive yourself to the hospital. Summary Gastroesophageal reflux happens when acid from the stomach flows up into the esophagus. GERD is a disease in which the reflux happens often, causes frequent or severe symptoms, or causes problems such as damage to the esophagus. Treatment for this  condition may vary depending on how severe your symptoms are. Your health care provider may recommend diet and lifestyle changes, medicine, or surgery. Contact a health care provider if you have new or worsening symptoms. Take over-the-counter and prescription medicines only as told by your health care provider. Do not take aspirin, ibuprofen, or other NSAIDs unless your health care provider told you to do so. Keep all follow-up visits as told by your  health care provider. This is important. This information is not intended to replace advice given to you by your health care provider. Make sure you discuss any questions you have with your health care provider. Document Revised: 04/06/2020 Document Reviewed: 04/06/2020 Elsevier Patient Education  2022 [employer].     Due to recent changes in healthcare laws, you may see the results of your imaging and laboratory studies on MyChart before your provider has had a chance to review them.  We understand that in some cases there may be results that are confusing or concerning to you. Not all laboratory results come back in the same time frame and the provider may be waiting for multiple results in order to interpret others.  Please give [place] 48 hours in order for your provider to thoroughly review all the results before contacting the office for clarification of your results.    If you are age 73 or older, your body mass index should be between 23-30. Your Body mass index is 33.4 kg/m. If this is out of the aforementioned range listed, please consider follow up with your Primary Care Provider.  If you are age 31 or younger, your body mass index should be between 19-25. Your Body mass index is 33.4 kg/m. If this is out of the aformentioned range listed, please consider follow up with your Primary Care Provider.   ________________________________________________________  The [place] [facility] providers would like to encourage you to use [facility] to communicate with providers for non-urgent requests or questions.  Due to long hold times on the telephone, sending your provider a message by [facility] may be a faster and more efficient way to get a response.  Please allow 48 business hours for a response.  Please remember that this is for non-urgent requests.  _______________________________________________________   I appreciate the  opportunity to care for you  Thank You   Harl Bowie , MD

## 2021-08-20 NOTE — Progress Notes (Signed)
Nancy Robertson    [id number]    Aug 18, 1966  Primary Care Physician:Luking, Elayne Snare, MD  Referring Physician: Kathyrn Drown, [place],  [place] [id number]   Chief complaint: Epigastric pain, Abdominal pain, constipation, GERD  HPI:  55 year old very pleasant female here for follow-up visit for chronic GERD and chronic idiopathic constipation Last office visit in February 2020 with Ellouise Newer and prior to that she was seeing Dr.Rehman.  She had remote visit with Dr. Henrene Pastor 25 years ago She is experiencing worsening epigastric and right upper quadrant discomfort, worsening constipation and abdominal bloating Overall symptoms are worse in the past 2-3 years  She started using MiraLAX 1 capful daily at bedtime with improvement of constipation, is having bowel movement on average every day, denies hard stool or excessive straining.  No rectal bleeding or melena. No vomiting, unintentional weight loss or decreased appetite.  She has on and off epigastric and right lower quadrant abdominal pain, no relationship to diet though does feel worse after she drinks any alcohol or has a large meal like a big bowl of pasta She has been avoiding milk and milk products and alcohol in the past 2 months. She feel her activity has decreased, and has gained significant weight in the past 2 to 3 years. She is currently working remotely and is not as active as she was before  She takes Protonix daily with good control of heartburn, occasional breakthrough symptoms.  She is using Carafate as needed  Father had large polyps removed that were advanced precancerous polyps in his 33s and 20s.   Colonoscopy 07/07/2016 - Two small polyps (Tubular adenomas) in the sigmoid colon and at the splenic flexure, removed with a cold snare. Resected and retrieved. - External hemorrhoids. - Single small anal papilla. Outpatient Encounter Medications as of 08/20/2021  Medication  Sig   albuterol (VENTOLIN HFA) 108 (90 Base) MCG/ACT inhaler Inhale 2 puffs into the lungs every 6 (six) hours as needed for wheezing.   benzonatate (TESSALON) 100 MG capsule Take 1-2 capsules (100-200 mg total) by mouth 3 (three) times daily as needed for cough.   budesonide-formoterol (SYMBICORT) 80-4.5 MCG/ACT inhaler Inhale 2 puffs into the lungs 2 (two) times daily.   cycloSPORINE (RESTASIS) 0.05 % ophthalmic emulsion    doxycycline (VIBRA-TABS) 100 MG tablet Take one tablet po twice daily for 7 days   predniSONE (DELTASONE) 20 MG tablet Take 2 tablets daily with breakfast.   promethazine-dextromethorphan (PROMETHAZINE-DM) 6.25-15 MG/5ML syrup Take 5 mLs by mouth at bedtime as needed for cough.   sertraline (ZOLOFT) 50 MG tablet TAKE 1/2 TABLET BY MOUTH EVERY DAY FOR 6 DAYS THEN TAKE 1 TABLET BY MOUTH EVERY DAY   sucralfate (CARAFATE) 1 GM/10ML suspension Take 10 mLs (1 g total) by mouth 4 (four) times daily -  with meals and at bedtime. Prn acid reflux   OVER THE COUNTER MEDICATION Vit d3  otc med for hot flashes   No facility-administered encounter medications on file as of 08/20/2021.    Allergies as of 08/20/2021 - Review Complete 08/03/2021  Allergen Reaction Noted   Azithromycin Nausea Only 10/27/2011   Erythromycin Nausea And Vomiting 10/27/2011   Qsymia [phentermine-topiramate] Other (See Comments) 07/29/2013   Penicillins  10/27/2011    Past Medical History:  Diagnosis Date   Allergy    Asthma    seasonal only   Chest pain 04/17/2012   Dyspepsia 03/27/2019  GERD (gastroesophageal reflux disease)    Mixed hyperlipidemia 04/17/2012   Palpitations 04/17/2012   TMJ (dislocation of temporomandibular joint) 04/17/2012    Past Surgical History:  Procedure Laterality Date   BLADDER REPAIR  2010   BREAST BIOPSY Right    2020   COLONOSCOPY N/A 07/07/2016   Procedure: COLONOSCOPY;  Surgeon: Rogene Houston, MD;  Location: [facility];  Service: Endoscopy;  Laterality: N/A;   1030    CYSTOSCOPY  11/10/2011   Procedure: CYSTOSCOPY;  Surgeon: Delice Lesch, MD;  Location: [facility];  Service: Gynecology;  Laterality: N/A;   KNEE SURGERY  2007   right   SALPINGOOPHORECTOMY  11/10/2011   Procedure: SALPINGO OOPHERECTOMY;  Surgeon: Delice Lesch, MD;  Location: [facility];  Service: Gynecology;  Laterality: Bilateral;   VAGINAL HYSTERECTOMY  11/10/2011   Procedure: HYSTERECTOMY VAGINAL;  Surgeon: Delice Lesch, MD;  Location: [facility];  Service: Gynecology;  Laterality: N/A;  Total Vaginal Hysterectomy, bilateral salpingo-oopherectomy, cystoscopy    Family History  Problem Relation Age of Onset   Hypertension Father    Diabetes Father    Hyperlipidemia Father    Lung cancer Father    COPD Father    Stroke Father    Colon polyps Father    Breast cancer Mother    Bladder Cancer Paternal Aunt    Bladder Cancer Paternal Uncle    Colon cancer Neg Hx    Esophageal cancer Neg Hx    Liver disease Neg Hx    Pancreatic cancer Neg Hx     Social History   Socioeconomic History   Marital status: Married    Spouse name: Not on file   Number of children: Not on file   Years of education: Not on file   Highest education level: Not on file  Occupational History   Not on file  Tobacco Use   Smoking status: Never   Smokeless tobacco: Never  Vaping Use   Vaping Use: Never used  Substance and Sexual Activity   Alcohol use: Yes    Comment: occasionally wine    Drug use: No   Sexual activity: Yes    Birth control/protection: Surgical  Other Topics Concern   Not on file  Social History Narrative   Not on file   Social Determinants of Health   Financial Resource Strain: Not on file  Food Insecurity: Not on file  Transportation Needs: Not on file  Physical Activity: Not on file  Stress: Not on file  Social Connections: Not on file  Intimate Partner Violence: Not on file      Review of systems: All other review of systems negative except as mentioned in  the HPI.   Physical Exam: Vitals:   08/20/21 0850  BP: 116/70  Pulse: 85   Body mass index is 33.4 kg/m. Gen:      No acute distress HEENT:  sclera anicteric Abd:      soft, mild epigastric and right upper quadrant tenderness, palpable liver with slightly increased liver span Ext:    No edema Neuro: alert and oriented x 3 Psych: normal mood and affect  Data Reviewed:  Reviewed labs, radiology imaging, old records and pertinent past GI work up   Assessment and Plan/Recommendations:  55 year old very pleasant female with chronic GERD, chronic idiopathic constipation with worsening abdominal bloating, epigastric and right upper quadrant discomfort  Will obtain right upper quadrant ultrasound to exclude gallbladder disease, fatty liver or neoplastic lesion Continue to avoid  alcohol  History of adenomatous colon polyps and family history of advanced large adenomatous colon polyps in father She is due for surveillance colonoscopy, will schedule it  Will plan for EGD along with colonoscopy for further evaluation of epigastric abdominal pain and breakthrough GERD symptoms despite daily PPI.  Will need to exclude peptic ulcer disease, erosive gastritis esophagitis and H. pylori related dyspepsia The risks and benefits as well as alternatives of endoscopic procedure(s) have been discussed and reviewed. All questions answered. The patient agrees to proceed  GERD: Continue pantoprazole 40 mg daily and antireflux measures Use Carafate 3 times daily before meals as needed for dyspepsia symptoms  Continue MiraLAX 1 capful daily for chronic idiopathic constipation Increase dietary fiber and water intake  Obesity: Discussed dietary changes and exercise Avoid high carb diet, soda, fruit juice and sweet tea  This visit required >40 minutes of patient care (this includes precharting, chart review, review of results, face-to-face time used for counseling as well as treatment plan and  follow-up. The patient was provided an opportunity to ask questions and all were answered. The patient agreed with the plan and demonstrated an understanding of the instructions.  Damaris Hippo , MD    CC: Kathyrn Drown, MD

## 2021-08-23 ENCOUNTER — Ambulatory Visit (INDEPENDENT_AMBULATORY_CARE_PROVIDER_SITE_OTHER): Payer: BC Managed Care – PPO | Admitting: Psychiatry

## 2021-08-23 ENCOUNTER — Other Ambulatory Visit: Payer: Self-pay

## 2021-08-23 DIAGNOSIS — F411 Generalized anxiety disorder: Secondary | ICD-10-CM

## 2021-08-23 NOTE — Progress Notes (Signed)
[facility] Counselor/Therapist Progress Note  Patient ID: EVERLIE EBLE, MRN: [id number],    Date: 08/23/2021  Time Spent: 57 minutes   Treatment Type: Individual Therapy  Reported Symptoms: anxiety (improving)  Mental Status Exam:  Appearance:   Well Groomed     Behavior:  Appropriate, Sharing, and Motivated  Motor:  Normal  Speech/Language:   Clear and Coherent  Affect:  Appropriate and some anxiousness but improving  Mood:  Some anxiety but improving  Thought process:  goal directed  Thought content:    Some overthinking and is improving  Sensory/Perceptual disturbances:    WNL  Orientation:  oriented to person, place, time/date, situation, day of week, month of year, year, and stated date of Nov. 14, 2022  Attention:  Good  Concentration:  Good  Memory:  WNL  Fund of knowledge:   Good  Insight:    Good  Judgment:   Good  Impulse Control:  Good   Risk Assessment: Danger to Self:  No Self-injurious Behavior: No Danger to Others: No Duty to Warn:no Physical Aggression / Violence:No  Access to Firearms a concern: No  Gang Involvement:No   Subjective:  Patient in today reporting anxiety decreasing and starting to "feel joy again." Has followed up well from past sessions in working on treatment strategies and goals. Less stressed and on edge. No longer having daily anxiety.  Reports increased awareness earlier of triggers to my anxiety and that "has helped me."  Patient in today reporting anxiety decreasing and starting to "feel joy again."  Has followed up well from past sessions in working on treatment strategies and goals. Less stressed and on edge. No longer having daily anxiety. Reports increased awareness earlier of triggers to her anxiety and also unresolved grief which she is accepting and learning to manage them better. Processed more concerns about her 39 yr son living with them and has history of adhd and dyslexia. Doing better "not assuming the worst."   Trying to look for what may go right vs wrong.  Needing "more acceptance of myself and where I am at this point in time, have fewer "shoulds", cutting out so much negativity and worry". Concerned about her 25 yr old daughter with undiagnosed concerns physically and being referred to [facility] for further testing.  Definite progress since last appointment and motivation remains good.  Interventions: Solution-Oriented/Positive Psychology and Insight-Oriented  Diagnosis:   ICD-10-CM   1. Generalized anxiety disorder  F41.1      Treatment Goal Plan: Patient not signing Tx Plan on computer screen due to Covid. Treatment Goals: Treatment goals remain on treatment plan as patient works with strategies to achieve her her goal.  Progress is assessed each session and documented in the "progress" section of Tx note. Long term goal: Reduce overall level, frequency, and intensity of the anxiety so that daily functioning in not impaired. Short term goal: Verbalize an understanding of the role that fearful thinking plays in creating fears, excessive worry, and persistent anxiety symptoms. Strategies: Discuss examples demonstrating that unrealistic fear or worry typically overestimates the probability of threats and underestimates the client's ability to manage realistic demands.    Plan:  Patient today showing good motivation and engagement in session as she worked more on becoming less anxious and having a more balanced view of situations to where she can be "a concerned person and hoping for the best in situations, but stop being overly anxious".  She reports this has been a really  big help to her as she continues to work on this more consistently.  Gave several examples in session today of how she is working on this outside of sessions with increased success more recently.  Processed several different stressors that she is trying to manage currently including family situations/health conditions.   Noticing some progress within herself, she reports is helping her have a broader view of the things with which she is trying to cope.  Encouraged patient and her practice of positive behaviors including: Looking for the positives within herself, refraining from self negating, getting outside daily, letting go of assuming worst case scenarios, focus on the present and what she can control or change, consistent positive self talk, decreasing self blame, intentionally looking for more positives and negatives daily, decrease self doubt, reduce overthinking and over analyzing, recognizing and emphasizing her strengths, asking for what she needs is for support, allowing her faith to be a resource in her emotional health as well as spiritual, remaining on her prescribed medications, and feeling good about the strength she shows working with goal-directed behaviors to move in a direction that supports improved emotional health and overall wellbeing.  Goal review and progress/challenges noted with patient.  Next appointment within 2 to 3 weeks.  This record has been created using [employer].  Chart creation errors have been sought, but may not always have been located and corrected.  Such creation errors do not reflect on the standard of medical care provided.   Shanon Ace, LCSW

## 2021-08-25 ENCOUNTER — Encounter: Payer: Self-pay | Admitting: Nurse Practitioner

## 2021-08-26 ENCOUNTER — Other Ambulatory Visit: Payer: Self-pay

## 2021-08-26 ENCOUNTER — Ambulatory Visit (HOSPITAL_COMMUNITY)
Admission: RE | Admit: 2021-08-26 | Discharge: 2021-08-26 | Disposition: A | Discharge status: Home / Self Care | Payer: BC Managed Care – PPO | Source: Ambulatory Visit | Attending: Gastroenterology | Admitting: Gastroenterology

## 2021-08-26 DIAGNOSIS — R1013 Epigastric pain: Secondary | ICD-10-CM | POA: Insufficient documentation

## 2021-08-26 DIAGNOSIS — K760 Fatty (change of) liver, not elsewhere classified: Secondary | ICD-10-CM | POA: Diagnosis not present

## 2021-08-26 DIAGNOSIS — R1011 Right upper quadrant pain: Secondary | ICD-10-CM | POA: Insufficient documentation

## 2021-08-26 DIAGNOSIS — K219 Gastro-esophageal reflux disease without esophagitis: Secondary | ICD-10-CM | POA: Insufficient documentation

## 2021-08-26 DIAGNOSIS — R140 Abdominal distension (gaseous): Secondary | ICD-10-CM | POA: Insufficient documentation

## 2021-08-26 DIAGNOSIS — K5904 Chronic idiopathic constipation: Secondary | ICD-10-CM | POA: Insufficient documentation

## 2021-08-30 ENCOUNTER — Other Ambulatory Visit: Payer: Self-pay | Admitting: Nurse Practitioner

## 2021-08-30 DIAGNOSIS — R011 Cardiac murmur, unspecified: Secondary | ICD-10-CM

## 2021-09-01 ENCOUNTER — Encounter: Payer: Self-pay | Admitting: Internal Medicine

## 2021-09-01 ENCOUNTER — Other Ambulatory Visit: Payer: Self-pay

## 2021-09-01 ENCOUNTER — Ambulatory Visit (INDEPENDENT_AMBULATORY_CARE_PROVIDER_SITE_OTHER): Payer: BC Managed Care – PPO | Admitting: Internal Medicine

## 2021-09-01 VITALS — BP 134/84 | HR 84 | Ht 67.0 in | Wt 213.0 lb

## 2021-09-01 DIAGNOSIS — Z7189 Other specified counseling: Secondary | ICD-10-CM | POA: Diagnosis not present

## 2021-09-01 DIAGNOSIS — R011 Cardiac murmur, unspecified: Secondary | ICD-10-CM | POA: Diagnosis not present

## 2021-09-01 NOTE — Progress Notes (Addendum)
OFFICE CONSULT NOTE  Chief Complaint:  Murmur  Primary Care Physician: Kathyrn Drown, MD  HPI:  Nancy Robertson is a 55 y.o. female who is being seen today for the evaluation of abnormal EKG, murmur at the request of Nilda Simmer, NP. This is a pleasant 55 year old female kindly referred for evaluation of murmur.  This apparently was newly recognized although Ms. Angello says she was told she had a murmur many years ago.  She also has a daughter who has murmur in fact has mitral valve disease as well as regurgitation/prolapse.  Ms. Clites in the past had taken antibiotics suggesting that she may also have mitral valve prolapse.  Besides this though she is asymptomatic.  She denies any significant palpitations, worsening shortness of breath or recent chest pain.  She did have work-up for chest pain back in 2013 including stress testing which was negative.  PMHx:  Past Medical History:  Diagnosis Date   Allergy    Asthma    seasonal only   Chest pain 04/17/2012   Dyspepsia 03/27/2019   GERD (gastroesophageal reflux disease)    Mixed hyperlipidemia 04/17/2012   Palpitations 04/17/2012   TMJ (dislocation of temporomandibular joint) 04/17/2012    Past Surgical History:  Procedure Laterality Date   BLADDER REPAIR  2010   BREAST BIOPSY Right    2020   COLONOSCOPY N/A 07/07/2016   Procedure: COLONOSCOPY;  Surgeon: Rogene Houston, MD;  Location: [facility];  Service: Endoscopy;  Laterality: N/A;  1030    CYSTOSCOPY  11/10/2011   Procedure: CYSTOSCOPY;  Surgeon: Delice Lesch, MD;  Location: [facility];  Service: Gynecology;  Laterality: N/A;   KNEE SURGERY  2007   right   SALPINGOOPHORECTOMY  11/10/2011   Procedure: SALPINGO OOPHERECTOMY;  Surgeon: Delice Lesch, MD;  Location: [facility];  Service: Gynecology;  Laterality: Bilateral;   VAGINAL HYSTERECTOMY  11/10/2011   Procedure: HYSTERECTOMY VAGINAL;  Surgeon: Delice Lesch, MD;  Location: [facility];  Service: Gynecology;   Laterality: N/A;  Total Vaginal Hysterectomy, bilateral salpingo-oopherectomy, cystoscopy    FAMHx:  Family History  Problem Relation Age of Onset   Hypertension Father    Diabetes Father    Hyperlipidemia Father    Lung cancer Father    COPD Father    Stroke Father    Colon polyps Father    Breast cancer Mother    Bladder Cancer Paternal Aunt    Bladder Cancer Paternal Uncle    Colon cancer Neg Hx    Esophageal cancer Neg Hx    Liver disease Neg Hx    Pancreatic cancer Neg Hx     SOCHx:   reports that she has never smoked. She has never used smokeless tobacco. She reports current alcohol use. She reports that she does not use drugs.  ALLERGIES:  Allergies  Allergen Reactions   Azithromycin Nausea Only   Erythromycin Nausea And Vomiting   Qsymia [Phentermine-Topiramate] Other (See Comments)    Fuzzy thinking   Penicillins     Reaction from Childhood.    ROS: Pertinent items noted in HPI and remainder of comprehensive ROS otherwise negative.  HOME MEDS: Current Outpatient Medications on File Prior to Visit  Medication Sig Dispense Refill   pantoprazole (PROTONIX) 40 MG tablet Take 40 mg by mouth daily.     rosuvastatin (CRESTOR) 10 MG tablet Take 1 tablet (10 mg total) by mouth daily. For cholesterol 90 tablet 0   sertraline (ZOLOFT)  50 MG tablet Take 50 mg by mouth daily.     sucralfate (CARAFATE) 1 GM/10ML suspension SHAKE LIQUID AND TAKE 10 ML(1 GRAM) BY MOUTH FOUR TIMES DAILY AT BEDTIME WITH MEALS AS NEEDED FOR ACID REFLUX 420 mL 0   Na Sulfate-K Sulfate-Mg Sulf 17.5-3.13-1.6 GM/177ML SOLN 1 kit for colon prep as directed by GI office (Patient not taking: Reported on 09/01/2021) 354 mL 0   No current facility-administered medications on file prior to visit.    LABS/IMAGING: No results found for this or any previous visit (from the past 48 hour(s)). No results found.  LIPID PANEL:    Component Value Date/Time   CHOL 238 (H) 02/09/2021 0903   TRIG 257 (H)  02/09/2021 0903   HDL 43 02/09/2021 0903   CHOLHDL 5.5 (H) 02/09/2021 0903   CHOLHDL 4.2 11/29/2014 0902   VLDL 27 11/29/2014 0902   LDLCALC 148 (H) 02/09/2021 0903    WEIGHTS: Wt Readings from Last 3 Encounters:  09/01/21 213 lb (96.6 kg)  08/20/21 213 lb (96.6 kg)  08/20/21 213 lb 4 oz (96.7 kg)    VITALS: BP 134/84   Pulse 84   Ht 5' 7"  (1.702 m)   Wt 213 lb (96.6 kg)   LMP 11/04/2011   SpO2 97%   BMI 33.36 kg/m   EXAM: General appearance: alert and no distress Neck: no JVD, supple, symmetrical, trachea midline, and thyroid not enlarged, symmetric, no tenderness/mass/nodules Lungs: clear to auscultation bilaterally Heart: regular rate and rhythm, S1, S2 normal, and systolic murmur: early systolic 2/6, blowing at apex Abdomen: soft, non-tender; bowel sounds normal; no masses,  no organomegaly Extremities: extremities normal, atraumatic, no cyanosis or edema Pulses: 2+ and symmetric Skin: Skin color, texture, turgor normal. No rashes or lesions Neurologic: Grossly normal Psych: Pleasant  EKG: NSR at 84 - personally reviewed  ASSESSMENT: Systolic murmur-likely mitral regurgitation  PLAN: 1.   Will obtain an echo to evaluate her murmur.  In addition based on family history of heart disease she desires further risk assessment and therefore will proceed with coronary calcium score.  This could help define a baseline risk for cardiovascular disease.  Plan follow-up with me afterwards.  Thanks again for the kind referral.  Pixie Casino, MD, FACC, [place] Director of the [facility] Diplomate of the American Board of Clinical Lipidology Attending Cardiologist  Direct Dial: [phone]  Fax: [phone]  Website:  [email or web address] 09/01/2021, 4:23 PM

## 2021-09-01 NOTE — Patient Instructions (Addendum)
Medication Instructions:  No Changes In Medications at this time.  *If you need a refill on your cardiac medications before your next appointment, please call your pharmacy*  Testing/Procedures: Your physician has requested that you have an echocardiogram. Echocardiography is a painless test that uses sound waves to create images of your heart. It provides your doctor with information about the size and shape of your heart and how well your heart's chambers and valves are working. You may receive an ultrasound enhancing agent through an IV if needed to better visualize your heart during the echo.This procedure takes approximately one hour. There are no restrictions for this procedure.   Dr. Debara Pickett has ordered a CT coronary calcium score. This test is done at [address]. This is $99 out of pocket.   Coronary CalciumScan A coronary calcium scan is an imaging test used to look for deposits of calcium and other fatty materials (plaques) in the inner lining of the blood vessels of the heart (coronary arteries). These deposits of calcium and plaques can partly clog and narrow the coronary arteries without producing any symptoms or warning signs. This puts a person at risk for a heart attack. This test can detect these deposits before symptoms develop. Tell a health care provider about: Any allergies you have. All medicines you are taking, including vitamins, herbs, eye drops, creams, and over-the-counter medicines. Any problems you or family members have had with anesthetic medicines. Any blood disorders you have. Any surgeries you have had. Any medical conditions you have. Whether you are pregnant or may be pregnant. What are the risks? Generally, this is a safe procedure. However, problems may occur, including: Harm to a pregnant woman and her unborn baby. This test involves the use of radiation. Radiation exposure can be dangerous to a pregnant woman and her unborn baby. If you  are pregnant, you generally should not have this procedure done. Slight increase in the risk of cancer. This is because of the radiation involved in the test. What happens before the procedure? No preparation is needed for this procedure. What happens during the procedure? You will undress and remove any jewelry around your neck or chest. You will put on a hospital gown. Sticky electrodes will be placed on your chest. The electrodes will be connected to an electrocardiogram (ECG) machine to record a tracing of the electrical activity of your heart. A CT scanner will take pictures of your heart. During this time, you will be asked to lie still and hold your breath for 2-3 seconds while a picture of your heart is being taken. The procedure may vary among health care providers and hospitals. What happens after the procedure? You can get dressed. You can return to your normal activities. It is up to you to get the results of your test. Ask your health care provider, or the department that is doing the test, when your results will be ready. Summary A coronary calcium scan is an imaging test used to look for deposits of calcium and other fatty materials (plaques) in the inner lining of the blood vessels of the heart (coronary arteries). Generally, this is a safe procedure. Tell your health care provider if you are pregnant or may be pregnant. No preparation is needed for this procedure. A CT scanner will take pictures of your heart. You can return to your normal activities after the scan is done. This information is not intended to replace advice given to you by your health care  provider. Make sure you discuss any questions you have with your health care provider. Document Released: 03/24/2008 Document Revised: 08/15/2016 Document Reviewed: 08/15/2016 Elsevier Interactive Patient Education  2017 [place]: At [facility], you and your health needs are our priority.  As part of  our continuing mission to provide you with exceptional heart care, we have created designated Provider Care Teams.  These Care Teams include your primary Cardiologist (physician) and Advanced Practice Providers (APPs -  Physician Assistants and Nurse Practitioners) who all work together to provide you with the care you need, when you need it.  Your next appointment:   AFTER TESTING   The format for your next appointment:   In Person  Provider: Dr. Debara Pickett

## 2021-09-06 ENCOUNTER — Ambulatory Visit: Payer: BC Managed Care – PPO | Admitting: Psychiatry

## 2021-09-08 ENCOUNTER — Encounter: Payer: Self-pay | Admitting: Gastroenterology

## 2021-09-13 ENCOUNTER — Encounter: Payer: Self-pay | Admitting: Allergy & Immunology

## 2021-09-13 ENCOUNTER — Other Ambulatory Visit: Payer: Self-pay

## 2021-09-13 ENCOUNTER — Ambulatory Visit (INDEPENDENT_AMBULATORY_CARE_PROVIDER_SITE_OTHER): Payer: BC Managed Care – PPO | Admitting: Allergy & Immunology

## 2021-09-13 VITALS — BP 118/88 | HR 75 | Temp 98.6°F | Resp 18 | Ht 67.0 in | Wt 211.2 lb

## 2021-09-13 DIAGNOSIS — J310 Chronic rhinitis: Secondary | ICD-10-CM

## 2021-09-13 DIAGNOSIS — J3089 Other allergic rhinitis: Secondary | ICD-10-CM

## 2021-09-13 DIAGNOSIS — J4540 Moderate persistent asthma, uncomplicated: Secondary | ICD-10-CM

## 2021-09-13 DIAGNOSIS — K9049 Malabsorption due to intolerance, not elsewhere classified: Secondary | ICD-10-CM | POA: Diagnosis not present

## 2021-09-13 DIAGNOSIS — Z880 Allergy status to penicillin: Secondary | ICD-10-CM

## 2021-09-13 DIAGNOSIS — J302 Other seasonal allergic rhinitis: Secondary | ICD-10-CM | POA: Diagnosis not present

## 2021-09-13 MED ORDER — PAZEO 0.7 % OP SOLN: 1.0000 [drp] | Freq: Every day | OPHTHALMIC | 5 refills | Status: DC | PRN

## 2021-09-13 MED ORDER — RYALTRIS 665-25 MCG/ACT NA SUSP: 2.0000 | Freq: Two times a day (BID) | NASAL | 5 refills | Status: DC

## 2021-09-13 NOTE — Patient Instructions (Addendum)
1. Moderate persistent asthma, uncomplicated - Lung testing actually looked fairly good today.  - You seem to have everything under control with the Symbicort.  - Daily controller medication(s): Symbicort 80/4.29mcg two puffs twice daily with spacer during the certain times of the year - Prior to physical activity: albuterol 2 puffs 10-15 minutes before physical activity. - Rescue medications: albuterol 4 puffs every 4-6 hours as needed - Asthma control goals:  * Full participation in all desired activities (may need albuterol before activity) * Albuterol use two time or less a week on average (not counting use with activity) * Cough interfering with sleep two time or less a month * Oral steroids no more than once a year * No hospitalizations  2. Chronic rhinitis - Testing today showed: indoor molds, dust mites, cat, and dog. - Copy of test results provided.  - Avoidance measures provided. - Stop taking: Flonase (for now) - Continue with: Zyrtec (cetirizine) 10mg  tablet once daily - Start taking: Ryaltris (olopatadine/mometasone) two sprays per nostril 1-2 times daily as needed and Pazeo (olopatadine) one drop per eye daily as needed - Other eye drop options include Pataday or Zatidor (these are over the counter). - You can use an extra dose of the antihistamine, if needed, for breakthrough symptoms.  - Consider nasal saline rinses 1-2 times daily to remove allergens from the nasal cavities as well as help with mucous clearance (this is especially helpful to do before the nasal sprays are given) - Consider allergy shots as a means of long-term control. - Allergy shots "re-train" and "reset" the immune system to ignore environmental allergens and decrease the resulting immune response to those allergens (sneezing, itchy watery eyes, runny nose, nasal congestion, etc).    - Allergy shots improve symptoms in 75-85% of patients.  - We can discuss more at the next appointment if the medications  are not working for you.  3. Adverse food reaction - Testing to the most common foods, including gluten (wheat) was negative.  - Copy of test results provided. - This tests rules out >95% of all food allergies. - This does not rule out an intolerance.  4. Penicillin allergy label - Your history is very low risk. - Schedule an appointment for a penicillin challenge in the office. - We will give you 25% of a dose, watch you for 30 minutes, and then give you the remaining 75% of the dose. - It should take around 2 hours tops.   4. Return in about 6 weeks (around 10/25/2021) for PENICILLIN CHALLENGE.    Please inform us of any Emergency Department visits, hospitalizations, or changes in symptoms. Call us before going to the ED for breathing or allergy symptoms since we might be able to fit you in for a sick visit. Feel free to contact us anytime with any questions, problems, or concerns.  It was a pleasure to meet you today!  Websites that have reliable patient information: 1. American Academy of Asthma, Allergy, and Immunology: www.aaaai.org 2. Food Allergy Research and Education (FARE): foodallergy.org 3. Mothers of Asthmatics: http://www.asthmacommunitynetwork.org 4. American College of Allergy, Asthma, and Immunology: www.acaai.org   COVID-19 Vaccine Information can be found at: [email or web address] For questions related to vaccine distribution or appointments, please email [email or web address] or call [phone].   We realize that you might be concerned about having an allergic reaction to the COVID19 vaccines. To help with that concern, WE ARE OFFERING THE COVID19 VACCINES IN OUR OFFICE! Ask the front desk for dates!     "  Like" [place] on Facebook and Instagram for our latest updates!      A healthy democracy works best when [employer] participate! Make sure you are registered to vote! If you have moved or changed any of  your contact information, you will need to get this updated before voting!  In some cases, you MAY be able to register to vote online: [email or web address]     Airborne Adult Perc - 09/13/21 1030     Time Antigen Placed [place]    Location Back    Number of Test 59    Panel 1 Select    1. Control-Buffer 50% Glycerol Negative    2. Control-Histamine 1 mg/ml 2+    3. Albumin saline Negative    4. [place] Negative    5. [place] Negative    6. [place] Negative    7. [place] Blue Negative    8. Meadow Fescue Negative    9. Perennial Rye Negative    10. Sweet Vernal Negative    11. Timothy Negative    12. Cocklebur Negative    13. Burweed Marshelder Negative    14. Ragweed, short Negative    15. Ragweed, Giant Negative    16. Plantain,  English Negative    17. Lamb's Quarters Negative    18. Sheep Sorrell Negative    19. Rough Pigweed Negative    20. Marsh Elder, Rough Negative    21. Mugwort, Common Negative    22. Ash mix Negative    23. Birch mix Negative    24. Beech American Negative    25. Box, Elder Negative    26. Cedar, red Negative    27. Cottonwood, [place] Negative    28. Elm mix Negative    29. Hickory Negative    30. Maple mix Negative    31. Oak, [place] mix Negative    32. Pecan Pollen Negative    33. Pine mix Negative    34. Sycamore Eastern Negative    35. [place], Black Pollen Negative    36. Alternaria alternata Negative    37. Cladosporium Herbarum Negative    38. Aspergillus mix Negative    39. Penicillium mix Negative    40. Bipolaris sorokiniana (Helminthosporium) Negative    41. Drechslera spicifera (Curvularia) Negative    42. Mucor plumbeus Negative    43. Fusarium moniliforme Negative    44. Aureobasidium pullulans (pullulara) Negative    45. Rhizopus oryzae Negative    46. Botrytis cinera Negative    47. Epicoccum nigrum Negative    48. Phoma betae Negative    49. Candida Albicans  Negative    50. Trichophyton mentagrophytes Negative    51. Mite, D Farinae  5,000 AU/ml 2+    52. Mite, D Pteronyssinus  5,000 AU/ml 4+    53. Cat Hair 10,000 BAU/ml 2+    54.  Dog Epithelia 2+    55. Mixed Feathers Negative    56. Horse Epithelia Negative    57. Cockroach, German Negative    58. Mouse Negative    59. Tobacco Leaf Negative             Food Perc - 09/13/21 1030       Test Information   Time Antigen Placed 47    Allergen Manufacturer Lavella Hammock    Location Back    Number of allergen test [place]   1. Peanut  Negative    2. Soybean food Negative    3. Wheat, whole Negative    4. Sesame Negative    5. Milk, cow Negative    6. Egg White, chicken Negative    7. Casein Negative    8. Shellfish mix Negative    9. Fish mix Negative    10. Cashew Negative             Intradermal - 09/13/21 1102     Time Antigen Placed 1102    Allergen Manufacturer Greer    Location Arm    Number of Test 12    Intradermal Select    Control Negative    [place] Negative    Johnson Negative    7 Grass Negative    Ragweed mix Negative    Weed mix Negative    Tree mix Negative    Mold 1 Negative    Mold 2 2+    Mold 3 Negative    Mold 4 Negative    Cockroach Negative             Food Intolerance Versus Food Allergy  Food IntoleranceSome of the symptoms of food intolerance and food allergy are similar, but the differences between the two are very important. Eating a food you are intolerant to can leave you feeling miserable. However, if you have a true food allergy, your body's reaction to this food could be life-threatening.  Digestive system versus immune system  A food intolerance response takes place in the digestive system. It occurs when you are unable to properly breakdown the food. This could be due to enzyme deficiencies, sensitivity to food additives or reactions to naturally occurring chemicals in foods. Often, people can eat  small amounts of the food without causing problems.  A food allergic reaction involves the immune system. Your immune system controls how your body defends itself. For instance, if you have an allergy to cow's milk, your immune system identifies cow's milk as an invader or allergen. Your immune system overreacts by producing antibodies called Immunoglobulin E (IgE). These antibodies travel to cells that release chemicals, causing an allergic reaction. Each type of IgE has a specific "radar" for each type of allergen.  Unlike an intolerance to food, a food allergy can cause a serious or even life-threatening reaction by eating a microscopic amount, touching or inhaling the food.  Symptoms of allergic reactions to foods are generally seen on the skin (hives, itchiness, swelling of the skin). Gastrointestinal symptoms may include vomiting and diarrhea. Respiratory symptoms may accompany skin and gastrointestinal symptoms, but don't usually occur alone.  Anaphylaxis (pronounced an-a-fi-LAK-sis) is a serious allergic reaction that happens very quickly. Symptoms of anaphylaxis may include difficulty breathing, dizziness or loss of consciousness. Without immediate treatment--an injection of epinephrine (adrenalin) and expert care--anaphylaxis can be fatal.  To the Point: there is a very serious difference between being intolerant to a food and having a food allergy.     Regarding Food IgG Testing: In IgG testing, the blood is tested for IgG antibodies instead of being tested for IgE antibodies (i.e., the antibodies typically associated with food allergies). The existence of serum IgG antibodies towards particular foods is claimed by many practitioners as a tool to diagnose food allergy or intolerance. The problem with this is that IgG is a "memory antibody." IgG signifies exposure to a food, not allergy to a food. Since a normal immune system should make IgG antibodies to foreign proteins, a positive IgG  test to a food is a sign of a normal immune system. In fact, a positive result can actually indicate tolerance for the food, not intolerance. There is no scientific evidence to support IgG testing for the diagnosis of food allergies.   Control of Mold Allergen   Mold and fungi can grow on a variety of surfaces provided certain temperature and moisture conditions exist.  Outdoor molds grow on plants, decaying vegetation and soil.  The major outdoor mold, Alternaria and Cladosporium, are found in very high numbers during hot and dry conditions.  Generally, a late Summer - Fall peak is seen for common outdoor fungal spores.  Rain will temporarily lower outdoor mold spore count, but counts rise rapidly when the rainy period ends.  The most important indoor molds are Aspergillus and Penicillium.  Dark, humid and poorly ventilated basements are ideal sites for mold growth.  The next most common sites of mold growth are the bathroom and the kitchen.   Indoor (Perennial) Mold Control   Positive indoor molds via skin testing: Aspergillus and Penicillium  Maintain humidity below 50%. Clean washable surfaces with 5% bleach solution. Remove sources e.g. contaminated carpets.    Control of Dog or Cat Allergen  Avoidance is the best way to manage a dog or cat allergy. If you have a dog or cat and are allergic to dog or cats, consider removing the dog or cat from the home. If you have a dog or cat but don't want to find it a new home, or if your family wants a pet even though someone in the household is allergic, here are some strategies that may help keep symptoms at bay:  Keep the pet out of your bedroom and restrict it to only a few rooms. Be advised that keeping the dog or cat in only one room will not limit the allergens to that room. Don't pet, hug or kiss the dog or cat; if you do, wash your hands with soap and water. High-efficiency particulate air (HEPA) cleaners run continuously in a bedroom or  living room can reduce allergen levels over time. Regular use of a high-efficiency vacuum cleaner or a central vacuum can reduce allergen levels. Giving your dog or cat a bath at least once a week can reduce airborne allergen.  Control of Dust Mite Allergen    Dust mites play a major role in allergic asthma and rhinitis.  They occur in environments with high humidity wherever human skin is found.  Dust mites absorb humidity from the atmosphere (ie, they do not drink) and feed on organic matter (including shed human and animal skin).  Dust mites are a microscopic type of insect that you cannot see with the naked eye.  High levels of dust mites have been detected from mattresses, pillows, carpets, upholstered furniture, bed covers, clothes, soft toys and any woven material.  The principal allergen of the dust mite is found in its feces.  A gram of dust may contain 1,000 mites and 250,000 fecal particles.  Mite antigen is easily measured in the air during house cleaning activities.  Dust mites do not bite and do not cause harm to humans, other than by triggering allergies/asthma.    Ways to decrease your exposure to dust mites in your home:  Encase mattresses, box springs and pillows with a mite-impermeable barrier or cover   Wash sheets, blankets and drapes weekly in hot water (130 F) with detergent and dry them in a dryer on the hot setting.  Have the room cleaned frequently with a vacuum cleaner and a damp dust-mop.  For carpeting or rugs, vacuuming with a vacuum cleaner equipped with a high-efficiency particulate air (HEPA) filter.  The dust mite allergic individual should not be in a room which is being cleaned and should wait 1 hour after cleaning before going into the room. Do not sleep on upholstered furniture (eg, couches).   If possible removing carpeting, upholstered furniture and drapery from the home is ideal.  Horizontal blinds should be eliminated in the rooms where the person spends the  most time (bedroom, study, television room).  Washable vinyl, roller-type shades are optimal. Remove all non-washable stuffed toys from the bedroom.  Wash stuffed toys weekly like sheets and blankets above.   Reduce indoor humidity to less than 50%.  Inexpensive humidity monitors can be purchased at most hardware stores.  Do not use a humidifier as can make the problem worse and are not recommended.  Allergy Shots   Allergies are the result of a chain reaction that starts in the immune system. Your immune system controls how your body defends itself. For instance, if you have an allergy to pollen, your immune system identifies pollen as an invader or allergen. Your immune system overreacts by producing antibodies called Immunoglobulin E (IgE). These antibodies travel to cells that release chemicals, causing an allergic reaction.  The concept behind allergy immunotherapy, whether it is received in the form of shots or tablets, is that the immune system can be desensitized to specific allergens that trigger allergy symptoms. Although it requires time and patience, the payback can be long-term relief.  How Do Allergy Shots Work?  Allergy shots work much like a vaccine. Your body responds to injected amounts of a particular allergen given in increasing doses, eventually developing a resistance and tolerance to it. Allergy shots can lead to decreased, minimal or no allergy symptoms.  There generally are two phases: build-up and maintenance. Build-up often ranges from three to six months and involves receiving injections with increasing amounts of the allergens. The shots are typically given once or twice a week, though more rapid build-up schedules are sometimes used.  The maintenance phase begins when the most effective dose is reached. This dose is different for each person, depending on how allergic you are and your response to the build-up injections. Once the maintenance dose is reached, there are  longer periods between injections, typically two to four weeks.  Occasionally doctors give cortisone-type shots that can temporarily reduce allergy symptoms. These types of shots are different and should not be confused with allergy immunotherapy shots.  Who Can Be Treated with Allergy Shots?  Allergy shots may be a good treatment approach for people with allergic rhinitis (hay fever), allergic asthma, conjunctivitis (eye allergy) or stinging insect allergy.   Before deciding to begin allergy shots, you should consider:   The length of allergy season and the severity of your symptoms  Whether medications and/or changes to your environment can control your symptoms  Your desire to avoid long-term medication use  Time: allergy immunotherapy requires a major time commitment  Cost: may vary depending on your insurance coverage  Allergy shots for children age 67 and older are effective and often well tolerated. They might prevent the onset of new allergen sensitivities or the progression to asthma.  Allergy shots are not started on patients who are pregnant but can be continued on patients who become pregnant while receiving them. In some patients with other medical  conditions or who take certain common medications, allergy shots may be of risk. It is important to mention other medications you talk to your allergist.   When Will I Feel Better?  Some may experience decreased allergy symptoms during the build-up phase. For others, it may take as long as 12 months on the maintenance dose. If there is no improvement after a year of maintenance, your allergist will discuss other treatment options with you.  If you aren't responding to allergy shots, it may be because there is not enough dose of the allergen in your vaccine or there are missing allergens that were not identified during your allergy testing. Other reasons could be that there are high levels of the allergen in your environment or major  exposure to non-allergic triggers like tobacco smoke.  What Is the Length of Treatment?  Once the maintenance dose is reached, allergy shots are generally continued for three to five years. The decision to stop should be discussed with your allergist at that time. Some people may experience a permanent reduction of allergy symptoms. Others may relapse and a longer course of allergy shots can be considered.  What Are the Possible Reactions?  The two types of adverse reactions that can occur with allergy shots are local and systemic. Common local reactions include very mild redness and swelling at the injection site, which can happen immediately or several hours after. A systemic reaction, which is less common, affects the entire body or a particular body system. They are usually mild and typically respond quickly to medications. Signs include increased allergy symptoms such as sneezing, a stuffy nose or hives.  Rarely, a serious systemic reaction called anaphylaxis can develop. Symptoms include swelling in the throat, wheezing, a feeling of tightness in the chest, nausea or dizziness. Most serious systemic reactions develop within 30 minutes of allergy shots. This is why it is strongly recommended you wait in your doctor's office for 30 minutes after your injections. Your allergist is trained to watch for reactions, and his or her staff is trained and equipped with the proper medications to identify and treat them.  Who Should Administer Allergy Shots?  The preferred location for receiving shots is your prescribing allergist's office. Injections can sometimes be given at another facility where the physician and staff are trained to recognize and treat reactions, and have received instructions by your prescribing allergist.

## 2021-09-13 NOTE — Progress Notes (Signed)
NEW PATIENT  Date of Service/Encounter:  09/13/21  Consult requested by: Nancy Drown, MD   Assessment:   Moderate persistent asthma, uncomplicated  Perennial and seasonal allergic rhinitis (indoor molds, dust mites, cat, and dog)  Food intolerance - with negative testing to the most common.  Penicillin allergy - planning an oral challenge  Plan/Recommendations:   1. Moderate persistent asthma, uncomplicated - Lung testing actually looked fairly good today.  - You seem to have everything under control with the Symbicort.  - Daily controller medication(s): Symbicort 80/4.2mg two puffs twice daily with spacer during the certain times of the year - Prior to physical activity: albuterol 2 puffs 10-15 minutes before physical activity. - Rescue medications: albuterol 4 puffs every 4-6 hours as needed - Asthma control goals:  * Full participation in all desired activities (may need albuterol before activity) * Albuterol use two time or less a week on average (not counting use with activity) * Cough interfering with sleep two time or less a month * Oral steroids no more than once a year * No hospitalizations  2. Chronic rhinitis - Testing today showed: indoor molds, dust mites, cat, and dog. - Copy of test results provided.  - Avoidance measures provided. - Stop taking: Flonase (for now) - Continue with: Zyrtec (cetirizine) 138mtablet once daily - Start taking: Ryaltris (olopatadine/mometasone) two sprays per nostril 1-2 times daily as needed and Pazeo (olopatadine) one drop per eye daily as needed - Other eye drop options include Pataday or Zatidor (these are over the counter). - You can use an extra dose of the antihistamine, if needed, for breakthrough symptoms.  - Consider nasal saline rinses 1-2 times daily to remove allergens from the nasal cavities as well as help with mucous clearance (this is especially helpful to do before the nasal sprays are given) - Consider  allergy shots as a means of long-term control. - Allergy shots "re-train" and "reset" the immune system to ignore environmental allergens and decrease the resulting immune response to those allergens (sneezing, itchy watery eyes, runny nose, nasal congestion, etc).    - Allergy shots improve symptoms in 75-85% of patients.  - We can discuss more at the next appointment if the medications are not working for you.  3. Adverse food reaction - Testing to the most common foods, including gluten (wheat) was negative.  - Copy of test results provided. - This tests rules out >95% of all food allergies. - This does not rule out an intolerance.  4. Penicillin allergy label - Your history is very low risk. - Schedule an appointment for a penicillin challenge in the office. - We will give you 25% of a dose, watch you for 30 minutes, and then give you the remaining 75% of the dose. - It should take around 2 hours tops.   4. Return in about 6 weeks (around 10/25/2021) for PENICILLIN CHALLENGE.    This note in its entirety was forwarded to the Provider who requested this consultation.  Subjective:   Nancy Robertson a [age].o. female presenting today for evaluation of  Chief Complaint  Patient presents with   Allergy Testing    Says she thinks she a penicillin allergy. Says her mom is unsure if it is her or her brother who has the allergy. Her PCP recommended she come here to see is actually allergic. She also wants to be checked for foods and environmental allergies. Says she thinks she has a gluten allergy.  Asthma    Says it is seasonal.     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  Nancy Robertson was referred by Nancy Drown, MD.     Nancy Robertson is a 55 y.o. female presenting for an evaluation of a penicillin allergy as well as food and environmental allergies . She might have had a penicillin reaction when she was younger.    Asthma/Respiratory Symptom History: She has a history of seasonal asthma and she takes Symbicort.  She was diagnosed with asthma as a child. She saw someone in [place] when she was growing up. She thinks that she outgrew it for a long period of time. Her son has asthma as well and they saw someone with him when he was younger. So this person also evaluated her. She has recurrent bronchitis during the spring and the fall. She actually started Symbicort in February 2022. She is supposed to take Symbicort twice daily but it seems that she took a medication holiday from May through September. Then she restarted it and took it through the beginning of November. This has helped her avoid steroid injections and courses; her last one was in March 2022 or so. She does report coughing intermittently that seems to be more related to nasal drainage. She did have [place] in early October 2022; her symptoms have worsened since that time and remained fairly terrible.   Allergic Rhinitis Symptom History: She has a history of environmental allergies. They live in a home that was built in the 1940s. Symptoms seems to be worse over the last couple of years. There was discussion that they should get their vents cleaned. Spring and fall are worse but not particularly bad. She does take cetirizine daily. It helps with eye itching and eye  watering. She does that daily on the regular. She has not needed antibiotics in over one year.   She takes Mucinex on a PRN basis to help. She did use a nose spray to help with ear fullness; she is on Flonase daily. This has helped a bit. Of note, she does have TMJ and she wears a bite guard.   Of note, she does have a history of dry eyes.  Currently she is using just Systane eyedrops as needed.  She has been prescribed Restasis, but it is $800 with her insurance and she has not filled that prescription.  Food Allergy Symptom History: She thinks that she has a gluten allergy.  She reports that she is having a colonoscopy last week. She reports some intermittent stomach pain and bloating. She has tried cutting out dairy and gluten. She had an ultrasound performed that demonstrated fatty liver. No other abnormality was noted.    Drug Allergy Symptom History: She has a label of a penicillin allergy. She is unsure of any reactions but she knows that her older brother had whelps from  a penicillin. She does not think that she has ever had a penicillin allergy. She recently rescued a kitten who bit her. But she was unable to be given Augmentin, so she was given Bactrim instead.   Otherwise, there is no history of other atopic diseases, including stinging insect allergies, eczema, urticaria, or contact dermatitis. There is no significant infectious history. Vaccinations are up to date.    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   Irritable bowel syndrome with constipation 07/03/2021   Gastroesophageal reflux disease without esophagitis 07/02/2021   Anxiety and depression 07/02/2021   Moderate persistent asthma 10/16/2020   Dyspepsia 03/27/2019   Hypertriglyceridemia 01/21/2014   Morbid obesity ([place]) 07/31/2013   Renal cyst, left 07/15/2013   Chronic low back pain 04/29/2013   Ischemic colitis ([place]) 12/11/2012    Chest pain 04/17/2012   Mixed hyperlipidemia 04/17/2012   Palpitations 04/17/2012    Medication List:  Allergies as of 09/13/2021       Reactions   Azithromycin Nausea Only   Erythromycin Nausea And Vomiting   Qsymia [phentermine-topiramate] Other (See Comments)   Fuzzy thinking   Penicillins    Reaction from Childhood.        Medication List        Accurate as of September 13, 2021 12:06 PM. If you have any questions, ask your nurse or doctor.          budesonide-formoterol 80-4.5 MCG/ACT inhaler Commonly known as: SYMBICORT Inhale 2 puffs into the lungs 2 (two) times daily.   cetirizine 10 MG chewable tablet Commonly known as: ZYRTEC Chew 10 mg by mouth daily.   Na Sulfate-K Sulfate-Mg Sulf 17.5-3.13-1.6 GM/177ML Soln 1 kit for colon prep as directed by GI office   pantoprazole 40 MG tablet Commonly known as: PROTONIX Take 40 mg by mouth daily.   rosuvastatin 10 MG tablet Commonly known as: Crestor Take 1 tablet (10 mg total) by mouth daily. For cholesterol   sertraline 50 MG tablet Commonly known as: ZOLOFT Take 50 mg by mouth daily.   sucralfate 1 GM/10ML suspension Commonly known as: CARAFATE SHAKE LIQUID AND TAKE 10 ML(1 GRAM) BY MOUTH FOUR TIMES DAILY AT BEDTIME WITH MEALS AS NEEDED FOR ACID REFLUX        Birth History: non-contributory  Developmental History: non-contributory  Past Surgical History: Past Surgical History:  Procedure Laterality Date   BLADDER REPAIR  2010   BREAST BIOPSY Right    2020   COLONOSCOPY N/A 07/07/2016   Procedure: COLONOSCOPY;  Surgeon: Rogene Houston, MD;  [facility];  Service: Endoscopy;  Laterality: N/A;  1030    CYSTOSCOPY  11/10/2011   Procedure: CYSTOSCOPY;  Surgeon: Delice Lesch, MD;  Location: [facility];  Service: Gynecology;  Laterality: N/A;   KNEE SURGERY  2007   right   SALPINGOOPHORECTOMY  11/10/2011   Procedure: SALPINGO OOPHERECTOMY;  Surgeon: Delice Lesch, MD;  Location:  [facility];  Service: Gynecology;  Laterality: Bilateral;   VAGINAL HYSTERECTOMY  11/10/2011   Procedure: HYSTERECTOMY VAGINAL;  Surgeon: Delice Lesch, MD;  Location: [facility];  Service: Gynecology;  Laterality: N/A;  Total Vaginal Hysterectomy, bilateral salpingo-oopherectomy, cystoscopy     Family History: Family History  Problem Relation Age of Onset   Hypertension Father    Diabetes Father    Hyperlipidemia Father    Lung cancer Father    COPD Father  Stroke Father    Colon polyps Father    Breast cancer Mother    Bladder Cancer Paternal Aunt    Bladder Cancer Paternal Uncle    Colon cancer Neg Hx    Esophageal cancer Neg Hx    Liver disease Neg Hx    Pancreatic cancer Neg Hx      Social History: Cuca lives at home with his. 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 She works for [employer]. This was started in March 2020. She has been in Operations for banks for years.  She is in a house that was built in the 1940s.  There is some dampness in the basement.  They did install a [place] drain down there as well with a sump pump.  There is also a dehumidifier.  They have hardwood throughout the home.  They have gas heating and central cooling.  They also have a supplemental heat pump for heating.  There is a 37 year old dog in the home.  There is a cat who is only inside at night.  She goes into a crate at night.  There are no dust mite covers on the bedding.  There are no roaches.  There is no tobacco exposure.  There is no exposure to fumes, chemicals, or dust.  There is an eye way around 2 miles from their home.  They do not use a HEPA filter.   Review of Systems  Constitutional: Negative.  Negative for chills, fever, malaise/fatigue and weight loss.  HENT:  Positive for congestion. Negative for ear discharge and ear pain.        Positive for postnasal drip.  Eyes:  Positive for discharge. Negative for pain and redness.        Positive for ocular itching.  Respiratory:  Positive for cough. Negative for sputum production, shortness of breath and wheezing.        Positive for intermittent bronchitis.  Cardiovascular: Negative.  Negative for chest pain and palpitations.  Gastrointestinal:  Negative for abdominal pain, constipation, diarrhea, heartburn, nausea and vomiting.  Skin: Negative.  Negative for itching and rash.  Neurological:  Negative for dizziness and headaches.  Endo/Heme/Allergies:  Negative for environmental allergies. Does not bruise/bleed easily.      Objective:   Blood pressure 118/88, pulse 75, temperature 98.6 F (37 C), temperature source Temporal, resp. rate 18, height _0  (1.702 m), weight 211 lb 3.2 oz (95.8 kg), last menstrual period 11/04/2011, SpO2 96 %. Body mass index is 33.08 kg/m.   Physical Exam:   Physical Exam Vitals reviewed.  Constitutional:      Appearance: She is well-developed.  HENT:     Head: Normocephalic and atraumatic.     Right Ear: Tympanic membrane, ear canal and external ear normal. No drainage, swelling or tenderness. Tympanic membrane is not injected, scarred, erythematous, retracted or bulging.     Left Ear: Tympanic membrane, ear canal and external ear normal. No drainage, swelling or tenderness. Tympanic membrane is not injected, scarred, erythematous, retracted or bulging.     Nose: Mucosal edema and rhinorrhea present. No nasal deformity or septal deviation.     Right Turbinates: Enlarged, swollen and pale.     Left Turbinates: Enlarged, swollen and pale.     Right Sinus: No maxillary sinus tenderness or frontal sinus tenderness.     Left Sinus: No maxillary sinus tenderness or frontal sinus tenderness.     Mouth/Throat:  Mouth: Mucous membranes are not pale and not dry.     Pharynx: Uvula midline.  Eyes:     General: Lids are normal. Allergic shiner present.        Right eye: No discharge.        Left eye: No discharge.      Conjunctiva/sclera: Conjunctivae normal.     Right eye: Right conjunctiva is not injected. No chemosis.    Left eye: Left conjunctiva is not injected. No chemosis.    Pupils: Pupils are equal, round, and reactive to light.  Cardiovascular:     Rate and Rhythm: Normal rate and regular rhythm.     Heart sounds: Normal heart sounds.  Pulmonary:     Effort: Pulmonary effort is normal. No tachypnea, accessory muscle usage or respiratory distress.     Breath sounds: Normal breath sounds. No wheezing, rhonchi or rales.     Comments: Moving air well in all lung fields.  No increased work of breathing. Chest:     Chest wall: No tenderness.  Abdominal:     Tenderness: There is no abdominal tenderness. There is no guarding or rebound.  Lymphadenopathy:     Head:     Right side of head: No submandibular, tonsillar or occipital adenopathy.     Left side of head: No submandibular, tonsillar or occipital adenopathy.     Cervical: No cervical adenopathy.  Skin:    General: Skin is warm.     Capillary Refill: Capillary refill takes less than 2 seconds.     Coloration: Skin is not pale.     Findings: No abrasion, erythema, petechiae or rash. Rash is not papular, urticarial or vesicular.     Comments: No eczematous or urticarial lesions noted.  Neurological:     Mental Status: She is alert.  Psychiatric:        Behavior: Behavior is cooperative.     Diagnostic studies:   Spirometry: results normal (FEV1: 2.28/80%, FVC: 3.22/89%, FEV1/FVC: 71%).    Spirometry consistent with normal pattern.   Allergy Studies: none   Airborne Adult Perc - 09/13/21 1030     Time Antigen Placed 1025    Allergen Manufacturer Greer    Location Back    Number of Test 59    Panel 1 Select    1. Control-Buffer 50% Glycerol Negative    2. Control-Histamine 1 mg/ml 2+    3. Albumin saline Negative    4. [place] Negative    5. [place] Negative    6. Johnson Negative    7. [place] Blue Negative    8. Meadow  Fescue Negative    9. Perennial Rye Negative    10. Sweet Vernal Negative    11. Timothy Negative    12. Cocklebur Negative    13. Burweed Marshelder Negative    14. Ragweed, short Negative    15. Ragweed, Giant Negative    16. Plantain,  English Negative    17. Lamb's Quarters Negative    18. Sheep Sorrell Negative    19. Rough Pigweed Negative    20. Marsh Elder, Rough Negative    21. Mugwort, Common Negative    22. Ash mix Negative    23. Birch mix Negative    24. Beech American Negative    25. Box, Elder Negative    26. Cedar, red Negative    27. Cottonwood, [place] Negative    28. Elm mix Negative    29. Hickory Negative    30.  Maple mix Negative    31. Oak, [place] mix Negative    32. Pecan Pollen Negative    33. Pine mix Negative    34. Sycamore Eastern Negative    35. [place], Black Pollen Negative    36. Alternaria alternata Negative    37. Cladosporium Herbarum Negative    38. Aspergillus mix Negative    39. Penicillium mix Negative    40. Bipolaris sorokiniana (Helminthosporium) Negative    41. Drechslera spicifera (Curvularia) Negative    42. Mucor plumbeus Negative    43. Fusarium moniliforme Negative    44. Aureobasidium pullulans (pullulara) Negative    45. Rhizopus oryzae Negative    46. Botrytis cinera Negative    47. Epicoccum nigrum Negative    48. Phoma betae Negative    49. Candida Albicans Negative    50. Trichophyton mentagrophytes Negative    51. Mite, D Farinae  5,000 AU/ml 2+    52. Mite, D Pteronyssinus  5,000 AU/ml 4+    53. Cat Hair 10,000 BAU/ml 2+    54.  Dog Epithelia 2+    55. Mixed Feathers Negative    56. Horse Epithelia Negative    57. Cockroach, German Negative    58. Mouse Negative    59. Tobacco Leaf Negative             Food Perc - 09/13/21 1030       Test Information   Time Antigen Placed 34    Allergen Manufacturer Lavella Hammock    Location Back    Number of allergen test [place]   1. Peanut  Negative    2. Soybean food Negative    3. Wheat, whole Negative    4. Sesame Negative    5. Milk, cow Negative    6. Egg White, chicken Negative    7. Casein Negative    8. Shellfish mix Negative    9. Fish mix Negative    10. Cashew Negative             Intradermal - 09/13/21 1102     Time Antigen Placed 1102    Allergen Manufacturer Greer    Location Arm    Number of Test 12    Intradermal Select    Control Negative    [place] Negative    Johnson Negative    7 Grass Negative    Ragweed mix Negative    Weed mix Negative    Tree mix Negative    Mold 1 Negative    Mold 2 2+    Mold 3 Negative    Mold 4 Negative    Cockroach Negative                    Salvatore Marvel, MD [facility]

## 2021-09-14 ENCOUNTER — Other Ambulatory Visit: Payer: Self-pay | Admitting: Family Medicine

## 2021-09-15 ENCOUNTER — Encounter: Payer: Self-pay | Admitting: Nurse Practitioner

## 2021-09-15 ENCOUNTER — Other Ambulatory Visit: Payer: Self-pay | Admitting: Family Medicine

## 2021-09-15 MED ORDER — SERTRALINE HCL 50 MG PO TABS: 50.0000 mg | ORAL_TABLET | Freq: Every day | ORAL | 1 refills | Status: DC

## 2021-09-20 ENCOUNTER — Ambulatory Visit (INDEPENDENT_AMBULATORY_CARE_PROVIDER_SITE_OTHER): Payer: BC Managed Care – PPO | Admitting: Psychiatry

## 2021-09-20 ENCOUNTER — Other Ambulatory Visit: Payer: Self-pay

## 2021-09-20 DIAGNOSIS — F411 Generalized anxiety disorder: Secondary | ICD-10-CM | POA: Diagnosis not present

## 2021-09-20 NOTE — Progress Notes (Signed)
[facility] Counselor/Therapist Progress Note  Patient ID: Nancy Robertson, MRN: [id number],    Date: 09/20/2021  Time Spent: 47 minutes   Treatment Type: Individual Therapy  Reported Symptoms:  anxiety (improved)  Mental Status Exam:  Appearance:   Well Groomed     Behavior:  Appropriate, Sharing, and Motivated  Motor:  Normal  Speech/Language:   Clear and Coherent  Affect:  Anxious (improving)  Mood:  anxious ("better")  Thought process:  normal  Thought content:    WNL  Sensory/Perceptual disturbances:    WNL  Orientation:  oriented to person, place, time/date, situation, day of week, month of year, year, and stated date of Dec. 12, 2022  Attention:  Good  Concentration:  Good  Memory:  WNL  Fund of knowledge:   Good  Insight:    Good  Judgment:   Good  Impulse Control:  Good   Risk Assessment: Danger to Self:  No Self-injurious Behavior: No Danger to Others: No Duty to Warn:no Physical Aggression / Violence:No  Access to Firearms a concern: No  Gang Involvement:No   Subjective: Patient in today reporting anxiety and it is improving. Still feeling more joy, but "having some other health concerns for which she is being evaluated." Feeling more motivated and task completion is good." Looking more for the right than the wrong, and decreasing her worrying. Fewer "shoulds." Doing very well on her goal-directed behaviors. Anxiety has decreased in level, frequency, and intensity. Has really worked hard on decreasing her worrying and is reflecting that progress.  Realizing she still needs to continue working with her goals.   Interventions: Solution-Oriented/Positive Psychology, Ego-Supportive, and Insight-Oriented  Treatment Goal Plan: Patient not signing Tx Plan on computer screen due to Covid. Treatment Goals: Treatment goals remain on treatment plan as patient works with strategies to achieve her her goal.  Progress is assessed each session and documented in the  "progress" section of Tx note. Long term goal: Reduce overall level, frequency, and intensity of the anxiety so that daily functioning in not impaired. Short term goal: Verbalize an understanding of the role that fearful thinking plays in creating fears, excessive worry, and persistent anxiety symptoms. Strategies: Discuss examples demonstrating that unrealistic fear or worry typically overestimates the probability of threats and underestimates the client's ability to manage realistic demands.     Diagnosis:   ICD-10-CM   1. Generalized anxiety disorder  F41.1      Plan:  Patient today showing patient today showing good motivation and participation in session today.Has made significant progress with her anxiety, worrying, and assuming"the worst".  Her family has a lot going on in the coming weeks with holiday time.  With patient doing significant better we discussed her holding off on rescheduling right now and she is continuing to work with her treatment goals and will call for a return appointment as needed.  She is very motivated and feeling very grateful "to be feeling better mentally and emotionally and like I can handle things better."  Feeling more encouraged, optimistic, balanced, and more of a belief in herself and managing situations and following through on not worrying and rather be a person of "concern" versus assuming worst case scenarios. Encouraged patient to continue her practice of positive behaviors including: Believing in herself and that she can continue good habits and her thoughts and ways of looking at situations, looking for the positives within herself, refraining from self negating, letting go of assuming "the worst", getting outside daily,  focus on the present and what she can control or change versus cannot, consistent positive self talk, decreasing self blame, intentionally looking for more positives versus negatives daily, decrease self doubt, reduce overthinking and  over analyzing, recognizing and emphasizing her strengths, asking for what she needs for support, allowing her faith to be a resource in her emotional health as well as spiritual, remaining on her prescribed medications, and recognize the strengths she shows working with goal-directed behaviors to move in a direction that supports improved emotional health.  Goal review and progress/challenges noted with patient.  Is to call for next appointment as needed.  This record has been created using [employer].  Chart creation errors have been sought, but may not always have been located and corrected.  Such creation errors do not reflect on the standard of medical care provided.   Shanon Ace, LCSW

## 2021-09-22 ENCOUNTER — Encounter: Payer: Self-pay | Admitting: Gastroenterology

## 2021-09-22 ENCOUNTER — Ambulatory Visit (AMBULATORY_SURGERY_CENTER): Payer: BC Managed Care – PPO | Admitting: Gastroenterology

## 2021-09-22 ENCOUNTER — Other Ambulatory Visit: Payer: Self-pay | Admitting: Family Medicine

## 2021-09-22 ENCOUNTER — Other Ambulatory Visit: Payer: Self-pay

## 2021-09-22 VITALS — BP 117/56 | HR 60 | Temp 97.1°F | Resp 18 | Ht 67.0 in | Wt 213.0 lb

## 2021-09-22 DIAGNOSIS — K317 Polyp of stomach and duodenum: Secondary | ICD-10-CM | POA: Diagnosis not present

## 2021-09-22 DIAGNOSIS — D123 Benign neoplasm of transverse colon: Secondary | ICD-10-CM | POA: Diagnosis not present

## 2021-09-22 DIAGNOSIS — Z86010 Personal history of colonic polyps: Secondary | ICD-10-CM

## 2021-09-22 DIAGNOSIS — K219 Gastro-esophageal reflux disease without esophagitis: Secondary | ICD-10-CM

## 2021-09-22 DIAGNOSIS — K449 Diaphragmatic hernia without obstruction or gangrene: Secondary | ICD-10-CM

## 2021-09-22 DIAGNOSIS — D122 Benign neoplasm of ascending colon: Secondary | ICD-10-CM

## 2021-09-22 MED ORDER — SODIUM CHLORIDE 0.9 % IV SOLN
500.0000 mL | Freq: Once | INTRAVENOUS | Status: DC
Start: 2021-09-22 — End: 2021-09-22

## 2021-09-22 NOTE — Patient Instructions (Addendum)
Make an appointment in January for an April office appointment.  Read all of the handouts given to you by your recover room nurse.  Take your protonix as ordered.  YOU HAD AN ENDOSCOPIC PROCEDURE TODAY AT [facility]:   Refer to the procedure report that was given to you for any specific questions about what was found during the examination.  If the procedure report does not answer your questions, please call your gastroenterologist to clarify.  If you requested that your care partner not be given the details of your procedure findings, then the procedure report has been included in a sealed envelope for you to review at your convenience later.  YOU SHOULD EXPECT: Some feelings of bloating in the abdomen. Passage of more gas than usual.  Walking can help get rid of the air that was put into your GI tract during the procedure and reduce the bloating. If you had a lower endoscopy (such as a colonoscopy or flexible sigmoidoscopy) you may notice spotting of blood in your stool or on the toilet paper. If you underwent a bowel prep for your procedure, you may not have a normal bowel movement for a few days.  Please Note:  You might notice some irritation and congestion in your nose or some drainage.  This is from the oxygen used during your procedure.  There is no need for concern and it should clear up in a day or so.  SYMPTOMS TO REPORT IMMEDIATELY:  Following lower endoscopy (colonoscopy or flexible sigmoidoscopy):  Excessive amounts of blood in the stool  Significant tenderness or worsening of abdominal pains  Swelling of the abdomen that is new, acute  Fever of 100F or higher  Following upper endoscopy (EGD)  Vomiting of blood or coffee ground material  New chest pain or pain under the shoulder blades  Painful or persistently difficult swallowing  New shortness of breath  Fever of 100F or higher  Black, tarry-looking stools  For urgent or emergent issues, a  gastroenterologist can be reached at any hour by calling [phone]. Do not use MyChart messaging for urgent concerns.    DIET:  We do recommend a small meal at first, but then you may proceed to your regular diet.  Drink plenty of fluids but you should avoid alcoholic beverages for 24 hours. Try to increase the fiber in your diet, and drink plenty of water.  ACTIVITY:  You should plan to take it easy for the rest of today and you should NOT DRIVE or use heavy machinery until tomorrow (because of the sedation medicines used during the test).    FOLLOW UP: Our staff will call the number listed on your records 48-72 hours following your procedure to check on you and address any questions or concerns that you may have regarding the information given to you following your procedure. If we do not reach you, we will leave a message.  We will attempt to reach you two times.  During this call, we will ask if you have developed any symptoms of COVID 19. If you develop any symptoms (ie: fever, flu-like symptoms, shortness of breath, cough etc.) before then, please call [phone].  If you test positive for Covid 19 in the 2 weeks post procedure, please call and report this information to [place].    If any biopsies were taken you will be contacted by phone or by letter within the next 1-3 weeks.  Please call us at [phone] if you have  not heard about the biopsies in 3 weeks.    SIGNATURES/CONFIDENTIALITY: You and/or your care partner have signed paperwork which will be entered into your electronic medical record.  These signatures attest to the fact that that the information above on your After Visit Summary has been reviewed and is understood.  Full responsibility of the confidentiality of this discharge information lies with you and/or your care-partner.

## 2021-09-22 NOTE — Op Note (Signed)
[facility] Patient Name: Nancy Robertson Procedure Date: 09/22/2021 3:03 PM MRN: [id number] Endoscopist: Mauri Pole , MD Age: 55 Referring MD:  Date of Birth: August 11, 1966 Gender: Female Account #: [id number] Procedure:                Upper GI endoscopy Indications:              Epigastric abdominal pain Medicines:                Monitored Anesthesia Care Procedure:                Pre-Anesthesia Assessment:                           - Prior to the procedure, a History and Physical                            was performed, and patient medications and                            allergies were reviewed. The patient's tolerance of                            previous anesthesia was also reviewed. The risks                            and benefits of the procedure and the sedation                            options and risks were discussed with the patient.                            All questions were answered, and informed consent                            was obtained. Prior Anticoagulants: The patient has                            taken no previous anticoagulant or antiplatelet                            agents. ASA Grade Assessment: II - A patient with                            mild systemic disease. After reviewing the risks                            and benefits, the patient was deemed in                            satisfactory condition to undergo the procedure.                           After obtaining informed consent, the endoscope was  passed under direct vision. Throughout the                            procedure, the patient's blood pressure, pulse, and                            oxygen saturations were monitored continuously. The                            GIF [id number] was introduced through the                            mouth, and advanced to the second part of duodenum.                            The upper GI endoscopy was  accomplished without                            difficulty. The patient tolerated the procedure                            well. Scope In: Scope Out: Findings:                 The Z-line was regular and was found 38 cm from the                            incisors.                           A small hiatal hernia was present.                           Multiple 6 to 20 mm pedunculated and sessile fundic                            gland polyps with no bleeding and stigmata of                            recent bleeding were found in the entire examined                            stomach. Biopsies were taken with a cold forceps                            for histology.                           The examined duodenum was normal. Complications:            No immediate complications. Estimated Blood Loss:     Estimated blood loss was minimal. Impression:               - Z-line regular, 38 cm from the incisors.                           -  Small hiatal hernia.                           - Multiple fundic gland polyps. Biopsied.                           - Normal examined duodenum. Recommendation:           - Resume previous diet.                           - Continue present medications.                           - Await pathology results.                           - Repeat upper endoscopy after studies are complete                            for surveillance based on pathology results.                           - Return to GI office in 4 months. Mauri Pole, MD 09/22/2021 3:42:43 PM This report has been signed electronically.

## 2021-09-22 NOTE — Progress Notes (Signed)
Called to room to assist during endoscopic procedure.  Patient ID and intended procedure confirmed with present staff. Received instructions for my participation in the procedure from the performing physician.  

## 2021-09-22 NOTE — Progress Notes (Signed)
[facility] History and Physical   Primary Care Physician:  Kathyrn Drown, MD   Reason for Procedure:  Epigastric abdominal pain, GERD, h/o colon adenomatous polyps  Plan:    EGD and colonoscopy with possible interventions as needed     HPI: Nancy Robertson is a very pleasant 55 y.o. female here for EGD and colonoscopy for further evaluation of epigastric abdominal  and surveillance with h/o colon adenomatous polyps. Please refer to office visit note 08/20/21 for additional details.  The risks and benefits as well as alternatives of endoscopic procedure(s) have been discussed and reviewed. All questions answered. The patient agrees to proceed.    Past Medical History:  Diagnosis Date   Allergy    Asthma    seasonal only   Chest pain 04/17/2012   Dyspepsia 03/27/2019   GERD (gastroesophageal reflux disease)    Mixed hyperlipidemia 04/17/2012   Palpitations 04/17/2012   TMJ (dislocation of temporomandibular joint) 04/17/2012    Past Surgical History:  Procedure Laterality Date   BLADDER REPAIR  2010   BREAST BIOPSY Right    2020   COLONOSCOPY N/A 07/07/2016   Procedure: COLONOSCOPY;  Surgeon: Rogene Houston, MD;  Location: [facility];  Service: Endoscopy;  Laterality: N/A;  1030    CYSTOSCOPY  11/10/2011   Procedure: CYSTOSCOPY;  Surgeon: Delice Lesch, MD;  Location: [facility];  Service: Gynecology;  Laterality: N/A;   KNEE SURGERY  2007   right   SALPINGOOPHORECTOMY  11/10/2011   Procedure: SALPINGO OOPHERECTOMY;  Surgeon: Delice Lesch, MD;  Location: [facility];  Service: Gynecology;  Laterality: Bilateral;   VAGINAL HYSTERECTOMY  11/10/2011   Procedure: HYSTERECTOMY VAGINAL;  Surgeon: Delice Lesch, MD;  Location: [facility];  Service: Gynecology;  Laterality: N/A;  Total Vaginal Hysterectomy, bilateral salpingo-oopherectomy, cystoscopy    Prior to Admission medications   Medication Sig Start Date End Date Taking? Authorizing Provider  Olopatadine HCl  (PAZEO) 0.7 % SOLN Apply 1 drop to eye daily as needed. 09/13/21  Yes Valentina Shaggy, MD  pantoprazole (PROTONIX) 40 MG tablet TAKE 1 TABLET DAILY 09/22/21  Yes Kathyrn Drown, MD  rosuvastatin (CRESTOR) 10 MG tablet Take 1 tablet (10 mg total) by mouth daily. For cholesterol 08/20/21  Yes Pearson Forster C, NP  sertraline (ZOLOFT) 50 MG tablet Take 1 tablet (50 mg total) by mouth daily. 09/15/21  Yes Kathyrn Drown, MD  budesonide-formoterol (SYMBICORT) 80-4.5 MCG/ACT inhaler Inhale 2 puffs into the lungs 2 (two) times daily.    [provider]  cetirizine (ZYRTEC) 10 MG chewable tablet Chew 10 mg by mouth daily.    [provider]  Olopatadine-Mometasone Rennie Plowman) [phone] MCG/ACT SUSP Place 2 sprays into the nose in the morning and at bedtime. 09/13/21   Valentina Shaggy, MD  sucralfate (CARAFATE) 1 GM/10ML suspension SHAKE LIQUID AND TAKE 10 ML(1 GRAM) BY MOUTH FOUR TIMES DAILY AT BEDTIME WITH MEALS AS NEEDED FOR ACID REFLUX 08/21/21   Nilda Simmer, NP    Current Outpatient Medications  Medication Sig Dispense Refill   Olopatadine HCl (PAZEO) 0.7 % SOLN Apply 1 drop to eye daily as needed. 2.5 mL 5   pantoprazole (PROTONIX) 40 MG tablet TAKE 1 TABLET DAILY 90 tablet 1   rosuvastatin (CRESTOR) 10 MG tablet Take 1 tablet (10 mg total) by mouth daily. For cholesterol 90 tablet 0   sertraline (ZOLOFT) 50 MG tablet Take 1 tablet (50 mg total) by mouth daily. 90 tablet  1   budesonide-formoterol (SYMBICORT) 80-4.5 MCG/ACT inhaler Inhale 2 puffs into the lungs 2 (two) times daily.     cetirizine (ZYRTEC) 10 MG chewable tablet Chew 10 mg by mouth daily.     Olopatadine-Mometasone (RYALTRIS) [id number] MCG/ACT SUSP Place 2 sprays into the nose in the morning and at bedtime. 29 g 5   sucralfate (CARAFATE) 1 GM/10ML suspension SHAKE LIQUID AND TAKE 10 ML(1 GRAM) BY MOUTH FOUR TIMES DAILY AT BEDTIME WITH MEALS AS NEEDED FOR ACID REFLUX 420 mL 0   Current  Facility-Administered Medications  Medication Dose Route Frequency Provider Last Rate Last Admin   0.9 %  sodium chloride infusion  500 mL Intravenous Once Iniko Robles, Venia Minks, MD        Allergies as of 09/22/2021 - Review Complete 09/22/2021  Allergen Reaction Noted   Azithromycin Nausea Only 10/27/2011   Erythromycin Nausea And Vomiting 10/27/2011   Qsymia [phentermine-topiramate] Other (See Comments) 07/29/2013   Penicillins  10/27/2011    Family History  Problem Relation Age of Onset   Hypertension Father    Diabetes Father    Hyperlipidemia Father    Lung cancer Father    COPD Father    Stroke Father    Colon polyps Father    Breast cancer Mother    Bladder Cancer Paternal Aunt    Bladder Cancer Paternal Uncle    Colon cancer Neg Hx    Esophageal cancer Neg Hx    Liver disease Neg Hx    Pancreatic cancer Neg Hx     Social History   Socioeconomic History   Marital status: Married    Spouse name: Not on file   Number of children: 2   Years of education: Not on file   Highest education level: Not on file  Occupational History   Not on file  Tobacco Use   Smoking status: Never   Smokeless tobacco: Never  Vaping Use   Vaping Use: Never used  Substance and Sexual Activity   Alcohol use: Yes    Comment: occasionally wine    Drug use: No   Sexual activity: Yes    Birth control/protection: Surgical  Other Topics Concern   Not on file  Social History Narrative   Not on file   Social Determinants of Health   Financial Resource Strain: Not on file  Food Insecurity: Not on file  Transportation Needs: Not on file  Physical Activity: Not on file  Stress: Not on file  Social Connections: Not on file  Intimate Partner Violence: Not on file    Review of Systems:  All other review of systems negative except as mentioned in the HPI.  Physical Exam: Vital signs in last 24 hours: BP 112/71    Pulse 63    Temp (!) 97.1 F (36.2 C)    Ht 5\' 7"  (1.702 m)    Wt  213 lb (96.6 kg)    LMP 11/04/2011    SpO2 98%    BMI 33.36 kg/m  General:   Alert, NAD Lungs:  Clear .   Heart:  Regular rate and rhythm Abdomen:  Soft, nontender and nondistended. Neuro/Psych:  Alert and cooperative. Normal mood and affect. A and O x 3  Reviewed labs, radiology imaging, old records and pertinent past GI work up  Patient is appropriate for planned procedure(s) and anesthesia in an ambulatory setting   K. Denzil Magnuson , MD [phone]

## 2021-09-22 NOTE — Op Note (Signed)
[facility] Patient Name: Nancy Robertson Procedure Date: 09/22/2021 3:01 PM MRN: [id number] Endoscopist: Mauri Pole , MD Age: 55 Referring MD:  Date of Birth: 03-29-1966 Gender: Female Account #: [id number] Procedure:                Colonoscopy Indications:              High risk colon cancer surveillance: Personal                            history of colonic polyps, High risk colon cancer                            surveillance: Personal history of adenoma less than                            10 mm in size Medicines:                Monitored Anesthesia Care Procedure:                Pre-Anesthesia Assessment:                           - Prior to the procedure, a History and Physical                            was performed, and patient medications and                            allergies were reviewed. The patient's tolerance of                            previous anesthesia was also reviewed. The risks                            and benefits of the procedure and the sedation                            options and risks were discussed with the patient.                            All questions were answered, and informed consent                            was obtained. Prior Anticoagulants: The patient has                            taken no previous anticoagulant or antiplatelet                            agents. ASA Grade Assessment: II - A patient with                            mild systemic disease. After reviewing the risks  and benefits, the patient was deemed in                            satisfactory condition to undergo the procedure.                           After obtaining informed consent, the colonoscope                            was passed under direct vision. Throughout the                            procedure, the patient's blood pressure, pulse, and                            oxygen saturations were monitored  continuously. The                            Olympus PCF-H190DL [id number] [id number]) Colonoscope was                            introduced through the anus and advanced to the the                            cecum, identified by appendiceal orifice and                            ileocecal valve. The colonoscopy was performed                            without difficulty. The patient tolerated the                            procedure well. The quality of the bowel                            preparation was good. The ileocecal valve,                            appendiceal orifice, and rectum were photographed.                            IC valve photograph was not saved in error. Scope In: 3:14:01 PM Scope Out: 3:33:38 PM Scope Withdrawal Time: 0 hours 13 minutes 21 seconds  Total Procedure Duration: 0 hours 19 minutes 37 seconds  Findings:                 The perianal and digital rectal examinations were                            normal.                           Five sessile polyps were found in the transverse  colon and ascending colon. The polyps were 3 to 6                            mm in size. These polyps were removed with a cold                            snare. Resection and retrieval were complete.                           A few small-mouthed diverticula were found in the                            sigmoid colon.                           Non-bleeding external and internal hemorrhoids were                            found during retroflexion. The hemorrhoids were                            small. Complications:            No immediate complications. Estimated Blood Loss:     Estimated blood loss was minimal. Impression:               - Five 3 to 6 mm polyps in the transverse colon and                            in the ascending colon, removed with a cold snare.                            Resected and retrieved.                           - Diverticulosis in the  sigmoid colon.                           - Non-bleeding external and internal hemorrhoids. Recommendation:           - Patient has a contact number available for                            emergencies. The signs and symptoms of potential                            delayed complications were discussed with the                            patient. Return to normal activities tomorrow.                            Written discharge instructions were provided to the  patient.                           - Resume previous diet.                           - Continue present medications.                           - Await pathology results.                           - Repeat colonoscopy in 3 - 5 years for                            surveillance based on pathology results. Mauri Pole, MD 09/22/2021 3:40:10 PM This report has been signed electronically.

## 2021-09-22 NOTE — Progress Notes (Signed)
To PACU, VSS. Report to [email or web address] 

## 2021-09-24 ENCOUNTER — Ambulatory Visit: Payer: BC Managed Care – PPO | Admitting: Allergy & Immunology

## 2021-09-24 ENCOUNTER — Telehealth: Payer: Self-pay

## 2021-09-24 NOTE — Telephone Encounter (Signed)
°  Follow up Call-  Call back number 09/22/2021  Post procedure Call Back phone  # [phone]  Permission to leave phone message Yes  Some recent data might be hidden     Patient questions:  Do you have a fever, pain , or abdominal swelling? No. Pain Score  0 *  Have you tolerated food without any problems? Yes.    Have you been able to return to your normal activities? Yes.    Do you have any questions about your discharge instructions: Diet   No. Medications  No. Follow up visit  No.  Do you have questions or concerns about your Care? No.  Actions: * If pain score is 4 or above: No action needed, pain <4.

## 2021-10-05 DIAGNOSIS — M9902 Segmental and somatic dysfunction of thoracic region: Secondary | ICD-10-CM | POA: Diagnosis not present

## 2021-10-05 DIAGNOSIS — M546 Pain in thoracic spine: Secondary | ICD-10-CM | POA: Diagnosis not present

## 2021-10-05 DIAGNOSIS — M9901 Segmental and somatic dysfunction of cervical region: Secondary | ICD-10-CM | POA: Diagnosis not present

## 2021-10-05 DIAGNOSIS — M542 Cervicalgia: Secondary | ICD-10-CM | POA: Diagnosis not present

## 2021-10-07 ENCOUNTER — Ambulatory Visit: Payer: BC Managed Care – PPO | Admitting: Family Medicine

## 2021-10-12 ENCOUNTER — Ambulatory Visit (INDEPENDENT_AMBULATORY_CARE_PROVIDER_SITE_OTHER): Payer: BC Managed Care – PPO | Admitting: Family Medicine

## 2021-10-12 ENCOUNTER — Other Ambulatory Visit: Payer: Self-pay

## 2021-10-12 VITALS — BP 117/80 | HR 71 | Temp 97.4°F | Ht 67.0 in | Wt 210.0 lb

## 2021-10-12 DIAGNOSIS — K219 Gastro-esophageal reflux disease without esophagitis: Secondary | ICD-10-CM | POA: Diagnosis not present

## 2021-10-12 DIAGNOSIS — E782 Mixed hyperlipidemia: Secondary | ICD-10-CM

## 2021-10-12 DIAGNOSIS — Z79899 Other long term (current) drug therapy: Secondary | ICD-10-CM

## 2021-10-12 DIAGNOSIS — K760 Fatty (change of) liver, not elsewhere classified: Secondary | ICD-10-CM | POA: Diagnosis not present

## 2021-10-12 NOTE — Progress Notes (Signed)
° °  Subjective:    Patient ID: Nancy Robertson, female    DOB: [birth year], 56 y.o.   MRN: [id number]  HPI Follow up on swelling on left side of jaw. Patient states swelling is still there. Mixed hyperlipidemia - Plan: Lipid panel  Gastroesophageal reflux disease without esophagitis  Fatty liver - Plan: Hepatic function panel  High risk medication use - Plan: Basic metabolic panel She had previously had a CT scan of the neck that did not show any tumors She states she still feels somewhat swollen at times in her neck but also relates left ear pain in the jaw area.   Review of Systems     Objective:   Physical Exam  General-in no acute distress Eyes-no discharge Lungs-respiratory rate normal, CTA CV-no murmurs,RRR Extremities skin warm dry no edema Neuro grossly normal Behavior normal, alert  Neck exam normal no nodules felt tenderness around TMJ left side     Assessment & Plan:  Fatty liver Healthy diet regular activity do the best he can at losing weight  Hyperlipidemia continue current medication check lab work mid January will share with gastroenterology  Reflux with benign gastric polyps continue PPI  Jaw pain-patient states she will do chiropractic care to see if that helps but also consider ENT and oral surgeons opinion if ongoing troubles she will send [place] a MyChart message in a few weeks  No masses felt in the neck previous CAT scan for soft tissue of the neck was reassuring back in the spring

## 2021-10-13 DIAGNOSIS — M542 Cervicalgia: Secondary | ICD-10-CM | POA: Diagnosis not present

## 2021-10-13 DIAGNOSIS — M9901 Segmental and somatic dysfunction of cervical region: Secondary | ICD-10-CM | POA: Diagnosis not present

## 2021-10-13 DIAGNOSIS — M546 Pain in thoracic spine: Secondary | ICD-10-CM | POA: Diagnosis not present

## 2021-10-13 DIAGNOSIS — M9902 Segmental and somatic dysfunction of thoracic region: Secondary | ICD-10-CM | POA: Diagnosis not present

## 2021-10-14 ENCOUNTER — Encounter: Payer: Self-pay | Admitting: Family Medicine

## 2021-10-14 ENCOUNTER — Ambulatory Visit (HOSPITAL_COMMUNITY)
Admission: RE | Admit: 2021-10-14 | Discharge: 2021-10-14 | Disposition: A | Discharge status: Home / Self Care | Payer: BC Managed Care – PPO | Source: Ambulatory Visit | Attending: Internal Medicine | Admitting: Internal Medicine

## 2021-10-14 ENCOUNTER — Other Ambulatory Visit: Payer: Self-pay

## 2021-10-14 DIAGNOSIS — R011 Cardiac murmur, unspecified: Secondary | ICD-10-CM | POA: Insufficient documentation

## 2021-10-14 LAB — ECHOCARDIOGRAM COMPLETE
AR max vel: 2.37 cm2
AV Area VTI: 2.5 cm2
AV Area mean vel: 2.12 cm2
AV Mean grad: 5.3 mmHg
AV Peak grad: 10.5 mmHg
Ao pk vel: 1.62 m/s
Area-P 1/2: 2.24 cm2
S' Lateral: 2.6 cm

## 2021-10-14 NOTE — Progress Notes (Signed)
*  PRELIMINARY RESULTS* Echocardiogram 2D Echocardiogram has been performed.  Samuel Germany 10/14/2021, 11:20 AM

## 2021-10-15 ENCOUNTER — Ambulatory Visit (HOSPITAL_COMMUNITY)
Admission: RE | Admit: 2021-10-15 | Discharge: 2021-10-15 | Disposition: A | Discharge status: Home / Self Care | Payer: Self-pay | Source: Ambulatory Visit | Attending: Internal Medicine | Admitting: Internal Medicine

## 2021-10-15 DIAGNOSIS — K760 Fatty (change of) liver, not elsewhere classified: Secondary | ICD-10-CM | POA: Insufficient documentation

## 2021-10-15 DIAGNOSIS — M9902 Segmental and somatic dysfunction of thoracic region: Secondary | ICD-10-CM | POA: Diagnosis not present

## 2021-10-15 DIAGNOSIS — M9901 Segmental and somatic dysfunction of cervical region: Secondary | ICD-10-CM | POA: Diagnosis not present

## 2021-10-15 DIAGNOSIS — Z136 Encounter for screening for cardiovascular disorders: Secondary | ICD-10-CM | POA: Insufficient documentation

## 2021-10-15 DIAGNOSIS — M546 Pain in thoracic spine: Secondary | ICD-10-CM | POA: Diagnosis not present

## 2021-10-15 DIAGNOSIS — M542 Cervicalgia: Secondary | ICD-10-CM | POA: Diagnosis not present

## 2021-10-18 ENCOUNTER — Ambulatory Visit (HOSPITAL_COMMUNITY): Payer: BC Managed Care – PPO

## 2021-10-19 DIAGNOSIS — M9902 Segmental and somatic dysfunction of thoracic region: Secondary | ICD-10-CM | POA: Diagnosis not present

## 2021-10-19 DIAGNOSIS — M542 Cervicalgia: Secondary | ICD-10-CM | POA: Diagnosis not present

## 2021-10-19 DIAGNOSIS — M546 Pain in thoracic spine: Secondary | ICD-10-CM | POA: Diagnosis not present

## 2021-10-19 DIAGNOSIS — M9901 Segmental and somatic dysfunction of cervical region: Secondary | ICD-10-CM | POA: Diagnosis not present

## 2021-10-20 ENCOUNTER — Encounter: Payer: Self-pay | Admitting: Gastroenterology

## 2021-10-25 ENCOUNTER — Encounter: Payer: BC Managed Care – PPO | Admitting: Allergy & Immunology

## 2021-10-27 DIAGNOSIS — M9902 Segmental and somatic dysfunction of thoracic region: Secondary | ICD-10-CM | POA: Diagnosis not present

## 2021-10-27 DIAGNOSIS — M546 Pain in thoracic spine: Secondary | ICD-10-CM | POA: Diagnosis not present

## 2021-10-27 DIAGNOSIS — M542 Cervicalgia: Secondary | ICD-10-CM | POA: Diagnosis not present

## 2021-10-27 DIAGNOSIS — M9901 Segmental and somatic dysfunction of cervical region: Secondary | ICD-10-CM | POA: Diagnosis not present

## 2021-11-01 ENCOUNTER — Encounter: Payer: BC Managed Care – PPO | Admitting: Family Medicine

## 2021-11-13 ENCOUNTER — Other Ambulatory Visit: Payer: Self-pay | Admitting: Nurse Practitioner

## 2021-11-13 ENCOUNTER — Encounter: Payer: Self-pay | Admitting: Family Medicine

## 2021-11-15 MED ORDER — ROSUVASTATIN CALCIUM 10 MG PO TABS: 10.0000 mg | ORAL_TABLET | Freq: Every day | ORAL | 1 refills | Status: DC

## 2021-11-15 NOTE — Telephone Encounter (Signed)
Immunizations updated in EPIC

## 2021-11-22 ENCOUNTER — Ambulatory Visit (INDEPENDENT_AMBULATORY_CARE_PROVIDER_SITE_OTHER): Payer: BC Managed Care – PPO | Admitting: Allergy & Immunology

## 2021-11-22 ENCOUNTER — Encounter: Payer: Self-pay | Admitting: Allergy & Immunology

## 2021-11-22 ENCOUNTER — Other Ambulatory Visit: Payer: Self-pay

## 2021-11-22 VITALS — BP 120/78 | HR 78 | Temp 97.9°F | Resp 18 | Ht 67.0 in | Wt 213.4 lb

## 2021-11-22 DIAGNOSIS — J4540 Moderate persistent asthma, uncomplicated: Secondary | ICD-10-CM

## 2021-11-22 DIAGNOSIS — K9049 Malabsorption due to intolerance, not elsewhere classified: Secondary | ICD-10-CM

## 2021-11-22 DIAGNOSIS — T887XXD Unspecified adverse effect of drug or medicament, subsequent encounter: Secondary | ICD-10-CM

## 2021-11-22 DIAGNOSIS — J302 Other seasonal allergic rhinitis: Secondary | ICD-10-CM

## 2021-11-22 DIAGNOSIS — T50905D Adverse effect of unspecified drugs, medicaments and biological substances, subsequent encounter: Secondary | ICD-10-CM

## 2021-11-22 NOTE — Progress Notes (Signed)
FOLLOW UP  Date of Service/Encounter:  11/22/21   Assessment:   Penicillin allergy - passed penicillin challenge today  Plan/Recommendations:   Concern for penicillin allergy - You tolerated your penicillin challenge today without a problem. - I am going to remove this from your allergy list. - Call us with questions or concerns over the next couple of days.  2. Follow up in 4 months.    Subjective:   Nancy Robertson is a 56 y.o. female presenting today for follow up of  Chief Complaint  Patient presents with   Food/Drug Challenge    Penicillin challenge     Nancy Robertson has a history of the following: Patient Active Problem List   Diagnosis Date Noted   Seasonal and perennial allergic rhinitis 09/13/2021   Penicillin allergy 09/13/2021   Food intolerance 94/70/9628   Systolic murmur 36/62/9476   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   Irritable bowel syndrome with constipation 07/03/2021   Gastroesophageal reflux disease without esophagitis 07/02/2021   Anxiety and depression 07/02/2021   Moderate persistent asthma, uncomplicated 54/65/0354   Dyspepsia 03/27/2019   Hypertriglyceridemia 01/21/2014   Morbid obesity ([facility]) 07/31/2013   Renal cyst, left 07/15/2013   Chronic low back pain 04/29/2013   Ischemic colitis ([place]) 12/11/2012   Chest pain 04/17/2012   Mixed hyperlipidemia 04/17/2012   Palpitations 04/17/2012    History obtained from: chart review and patient.  Nancy Robertson is a 55 y.o. female presenting for a drug challenge.  She was last seen in December 2022.  As part of the visit, she reported a penicillin allergy.  She is unsure of the reaction, but she did have a family history of hives with penicillin.  She does not think that she has ever actually had penicillin. She presents today for a penicillin challenge.  Since last visit, she has done well.  She does report that the nose spray that we started her on has helped.  She also feels that her breathing is under  better control as well.  She is slightly anxious about her challenge, but otherwise is doing well.  Otherwise, there have been no changes to her past medical history, surgical history, family history, or social history.    Review of Systems  Constitutional: Negative.  Negative for fever, malaise/fatigue and weight loss.  HENT: Negative.  Negative for congestion, ear discharge and ear pain.   Eyes:  Negative for pain, discharge and redness.  Respiratory:  Negative for cough, sputum production, shortness of breath and wheezing.   Cardiovascular: Negative.  Negative for chest pain and palpitations.  Gastrointestinal:  Negative for abdominal pain and heartburn.  Skin: Negative.  Negative for itching and rash.  Neurological:  Negative for dizziness and headaches.  Endo/Heme/Allergies:  Negative for environmental allergies. Does not bruise/bleed easily.      Objective:   Blood pressure 120/78, pulse 78, temperature 97.9 F (36.6 C), resp. rate 18, height 5\' 7"  (1.702 m), weight 213 lb 6.4 oz (96.8 kg), last menstrual period 11/04/2011, SpO2 96 %. Body mass index is 33.42 kg/m.   Physical Exam: deferred since this was a challenge appointment only      Oral Challenge - 11/22/21 0900     Challenge Food/Drug penicillin    Food/Drug provided by office    BP 120/78    Pulse 78    Respirations 18    Lungs 96%    Skin clear    Mouth clear    Time 0902    Dose  23mL    Lungs clear    Skin clear    Mouth clear    Time 0939    Dose 39mL    Time 1049    Dose Final Vitals    BP 122/80    Pulse 69    Respirations 18    Lungs 97    Skin clear    Mouth clear                 Salvatore Marvel, MD  [facility]

## 2021-11-22 NOTE — Patient Instructions (Signed)
Concern for penicillin allergy - You tolerated your penicillin challenge today without a problem. - I am going to remove this from your allergy list. - Call us with questions or concerns over the next couple of days.  2. Follow up in 4 months.   Please inform us of any Emergency Department visits, hospitalizations, or changes in symptoms. Call us before going to the ED for breathing or allergy symptoms since we might be able to fit you in for a sick visit. Feel free to contact us anytime with any questions, problems, or concerns.  It was a pleasure to see you again today!  Websites that have reliable patient information: 1. American Academy of Asthma, Allergy, and Immunology: www.aaaai.org 2. Food Allergy Research and Education (FARE): foodallergy.org 3. Mothers of Asthmatics: http://www.asthmacommunitynetwork.org 4. American College of Allergy, Asthma, and Immunology: www.acaai.org   COVID-19 Vaccine Information can be found at: ShippingScam.co.uk For questions related to vaccine distribution or appointments, please email [email or web address] or call [phone].   We realize that you might be concerned about having an allergic reaction to the COVID19 vaccines. To help with that concern, WE ARE OFFERING THE COVID19 VACCINES IN OUR OFFICE! Ask the front desk for dates!     Like [place] on [place] and Instagram for our latest updates!      A healthy democracy works best when [employer] participate! Make sure you are registered to vote! If you have moved or changed any of your contact information, you will need to get this updated before voting!  In some cases, you MAY be able to register to vote online: [email or web address]

## 2021-12-07 DIAGNOSIS — K760 Fatty (change of) liver, not elsewhere classified: Secondary | ICD-10-CM | POA: Diagnosis not present

## 2021-12-07 DIAGNOSIS — E782 Mixed hyperlipidemia: Secondary | ICD-10-CM | POA: Diagnosis not present

## 2021-12-07 DIAGNOSIS — Z79899 Other long term (current) drug therapy: Secondary | ICD-10-CM | POA: Diagnosis not present

## 2021-12-08 DIAGNOSIS — M9901 Segmental and somatic dysfunction of cervical region: Secondary | ICD-10-CM | POA: Diagnosis not present

## 2021-12-08 DIAGNOSIS — M542 Cervicalgia: Secondary | ICD-10-CM | POA: Diagnosis not present

## 2021-12-08 DIAGNOSIS — M9902 Segmental and somatic dysfunction of thoracic region: Secondary | ICD-10-CM | POA: Diagnosis not present

## 2021-12-08 DIAGNOSIS — M546 Pain in thoracic spine: Secondary | ICD-10-CM | POA: Diagnosis not present

## 2021-12-08 LAB — BASIC METABOLIC PANEL
BUN/Creatinine Ratio: 16 (ref 9–23)
BUN: 13 mg/dL (ref 6–24)
CO2: 24 mmol/L (ref 20–29)
Calcium: 9.5 mg/dL (ref 8.7–10.2)
Chloride: 102 mmol/L (ref 96–106)
Creatinine, Ser: 0.8 mg/dL (ref 0.57–1.00)
Glucose: 93 mg/dL (ref 70–99)
Potassium: 4.4 mmol/L (ref 3.5–5.2)
Sodium: 141 mmol/L (ref 134–144)
eGFR: 86 mL/min/{1.73_m2} (ref >59)

## 2021-12-08 LAB — LIPID PANEL
Chol/HDL Ratio: 4.1 ratio (ref 0.0–4.4)
Cholesterol, Total: 177 mg/dL (ref 100–199)
HDL: 43 mg/dL (ref >39)
LDL Chol Calc (NIH): 93 mg/dL (ref 0–99)
Triglycerides: 246 mg/dL — ABNORMAL HIGH (ref 0–149)
VLDL Cholesterol Cal: 41 mg/dL — ABNORMAL HIGH (ref 5–40)

## 2021-12-08 LAB — HEPATIC FUNCTION PANEL
ALT: 32 IU/L (ref 0–32)
AST: 24 IU/L (ref 0–40)
Albumin: 4.6 g/dL (ref 3.8–4.9)
Alkaline Phosphatase: 72 IU/L (ref 44–121)
Bilirubin Total: 0.3 mg/dL (ref 0.0–1.2)
Bilirubin, Direct: <0.1 mg/dL (ref 0.00–0.40)
Total Protein: 6.7 g/dL (ref 6.0–8.5)

## 2021-12-10 ENCOUNTER — Encounter: Payer: Self-pay | Admitting: Family Medicine

## 2021-12-23 ENCOUNTER — Telehealth: Payer: Self-pay | Admitting: Family Medicine

## 2021-12-23 ENCOUNTER — Other Ambulatory Visit: Payer: Self-pay | Admitting: Family Medicine

## 2021-12-23 ENCOUNTER — Other Ambulatory Visit: Payer: Self-pay

## 2021-12-23 MED ORDER — ROSUVASTATIN CALCIUM 10 MG PO TABS: 10.0000 mg | ORAL_TABLET | Freq: Every day | ORAL | 1 refills | Status: DC

## 2021-12-23 NOTE — Telephone Encounter (Signed)
[facility] requesting refill on Sertraline 50 mg tablets. Requesting 90 day supply. Pt last seen 10/12/21 for follow up. Please advise. Thank you ?

## 2021-12-24 MED ORDER — SERTRALINE HCL 50 MG PO TABS: 50.0000 mg | ORAL_TABLET | Freq: Every day | ORAL | 1 refills | Status: DC

## 2021-12-24 NOTE — Telephone Encounter (Signed)
Refill sent to [facility].  

## 2021-12-24 NOTE — Addendum Note (Signed)
Addended by: Vicente Males on: 12/24/2021 08:17 AM ? ? Modules accepted: Orders ? ?

## 2021-12-24 NOTE — Telephone Encounter (Signed)
90-day with 1 refill 

## 2022-01-11 ENCOUNTER — Other Ambulatory Visit: Payer: Self-pay | Admitting: General Practice

## 2022-01-11 DIAGNOSIS — M26639 Articular disc disorder of temporomandibular joint, unspecified side: Secondary | ICD-10-CM

## 2022-01-18 ENCOUNTER — Encounter: Payer: Self-pay | Admitting: Family Medicine

## 2022-01-19 ENCOUNTER — Other Ambulatory Visit: Payer: Self-pay | Admitting: *Deleted

## 2022-01-19 MED ORDER — LORAZEPAM 1 MG PO TABS: ORAL_TABLET | ORAL | 0 refills | Status: DC

## 2022-01-19 NOTE — Telephone Encounter (Signed)
Nurses ?Please connect with Dwayne ? ? ?Generally what I recommend is lorazepam 1 mg ?This can be taken 60 to 90 minutes before doing the procedure.  It will cause drowsiness. ?She should not drive with this medicine after taking it that day.  She would be fine to drive the following day ?If she would rather use half a dose she can split the tablet in half. ? ?May send in prescription- ?Lorazepam, 1 mg, directions one half to 1 full tablet taken 60 to 90 minutes before procedure, #4, no refills ?You can send this prescription to me through refill request and I will sign off on it ?

## 2022-01-25 ENCOUNTER — Ambulatory Visit
Admission: RE | Admit: 2022-01-25 | Discharge: 2022-01-25 | Disposition: A | Discharge status: Home / Self Care | Payer: BC Managed Care – PPO | Source: Ambulatory Visit | Attending: General Practice | Admitting: General Practice

## 2022-01-25 DIAGNOSIS — S0302XA Dislocation of jaw, left side, initial encounter: Secondary | ICD-10-CM | POA: Diagnosis not present

## 2022-01-25 DIAGNOSIS — M26639 Articular disc disorder of temporomandibular joint, unspecified side: Secondary | ICD-10-CM

## 2022-01-25 DIAGNOSIS — M26632 Articular disc disorder of left temporomandibular joint: Secondary | ICD-10-CM | POA: Diagnosis not present

## 2022-02-15 DIAGNOSIS — G4719 Other hypersomnia: Secondary | ICD-10-CM | POA: Diagnosis not present

## 2022-02-25 DIAGNOSIS — M542 Cervicalgia: Secondary | ICD-10-CM | POA: Diagnosis not present

## 2022-02-25 DIAGNOSIS — M9901 Segmental and somatic dysfunction of cervical region: Secondary | ICD-10-CM | POA: Diagnosis not present

## 2022-02-25 DIAGNOSIS — M9902 Segmental and somatic dysfunction of thoracic region: Secondary | ICD-10-CM | POA: Diagnosis not present

## 2022-02-25 DIAGNOSIS — M546 Pain in thoracic spine: Secondary | ICD-10-CM | POA: Diagnosis not present

## 2022-03-18 DIAGNOSIS — G4733 Obstructive sleep apnea (adult) (pediatric): Secondary | ICD-10-CM | POA: Diagnosis not present

## 2022-03-21 ENCOUNTER — Other Ambulatory Visit: Payer: Self-pay | Admitting: Family Medicine

## 2022-03-22 ENCOUNTER — Encounter: Payer: Self-pay | Admitting: Family Medicine

## 2022-03-25 ENCOUNTER — Encounter: Payer: Self-pay | Admitting: Allergy & Immunology

## 2022-03-25 ENCOUNTER — Ambulatory Visit (INDEPENDENT_AMBULATORY_CARE_PROVIDER_SITE_OTHER): Payer: BC Managed Care – PPO | Admitting: Family Medicine

## 2022-03-25 VITALS — BP 124/80 | HR 76 | Temp 98.4°F | Resp 16 | Ht 67.0 in | Wt 211.2 lb

## 2022-03-25 DIAGNOSIS — J3089 Other allergic rhinitis: Secondary | ICD-10-CM | POA: Diagnosis not present

## 2022-03-25 DIAGNOSIS — K219 Gastro-esophageal reflux disease without esophagitis: Secondary | ICD-10-CM

## 2022-03-25 DIAGNOSIS — J4540 Moderate persistent asthma, uncomplicated: Secondary | ICD-10-CM | POA: Diagnosis not present

## 2022-03-25 DIAGNOSIS — H1010 Acute atopic conjunctivitis, unspecified eye: Secondary | ICD-10-CM

## 2022-03-25 DIAGNOSIS — H1013 Acute atopic conjunctivitis, bilateral: Secondary | ICD-10-CM | POA: Diagnosis not present

## 2022-03-25 DIAGNOSIS — J302 Other seasonal allergic rhinitis: Secondary | ICD-10-CM

## 2022-03-25 MED ORDER — ALBUTEROL SULFATE HFA 108 (90 BASE) MCG/ACT IN AERS
2.0000 | INHALATION_SPRAY | RESPIRATORY_TRACT | 5 refills | Status: DC | PRN
Start: 2022-03-25 — End: 2022-05-24

## 2022-03-25 NOTE — Progress Notes (Deleted)
FOLLOW UP  Date of Service/Encounter:  03/25/22   Assessment:   Moderate persistent asthma, uncomplicated   Perennial and seasonal allergic rhinitis (indoor molds, dust mites, cat, and dog)   Food intolerance - with negative testing to the most common.   Penicillin allergy - passed oral challenge February 2023   Plan/Recommendations:    There are no Patient Instructions on file for this visit.   Subjective:   Nancy Robertson is a 56 y.o. female presenting today for follow up of No chief complaint on file.   Nancy Robertson has a history of the following: Patient Active Problem List   Diagnosis Date Noted   Seasonal and perennial allergic rhinitis 09/13/2021   Penicillin allergy 09/13/2021   Food intolerance 24/23/5361   Systolic murmur 44/31/5400   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   Irritable bowel syndrome with constipation 07/03/2021   Gastroesophageal reflux disease without esophagitis 07/02/2021   Anxiety and depression 07/02/2021   Moderate persistent asthma, uncomplicated 86/76/1950   Dyspepsia 03/27/2019   Hypertriglyceridemia 01/21/2014   Morbid obesity ([place]) 07/31/2013   Renal cyst, left 07/15/2013   Chronic low back pain 04/29/2013   Ischemic colitis ([place]) 12/11/2012   Chest pain 04/17/2012   Mixed hyperlipidemia 04/17/2012   Palpitations 04/17/2012    History obtained from: chart review and {Persons; PED relatives w/patient:19415::"patient"}.  Bret is a 56 y.o. female presenting for {Blank single:19197::"a food challenge","a drug challenge","skin testing","a sick visit","an evaluation of ***","a follow up visit"}.  We last saw her in February 2023.  At that time, we did a penicillin challenge which she passed with flying colors.  We last saw her as a regular office visit as a new patient in December 2022.  At that time, her lung testing looked fairly good.  We continued with Symbicort 80 mcg 2 puffs twice daily as well as albuterol as needed.  For her  rhinitis, she had testing that was positive to indoor molds, dust mite, cat, and dog.  We stopped the Flonase and continue with Zyrtec.  We started Ryaltris 1 to 2 sprays per nostril up to twice daily and Pazeo as needed.  She had testing to the most common foods which were negative.  Since last visit,  {Blank single:19197::"Asthma/Respiratory Symptom History: ***"," "}  {Blank single:19197::"Allergic Rhinitis Symptom History: ***"," "}  {Blank single:19197::"Food Allergy Symptom History: ***"," "}  {Blank single:19197::"Skin Symptom History: ***"," "}  {Blank single:19197::"GERD Symptom History: ***"," "}  Otherwise, there have been no changes to her past medical history, surgical history, family history, or social history.    ROS     Objective:   Last menstrual period 11/04/2011. There is no height or weight on file to calculate BMI.    Physical Exam   Diagnostic studies: {Blank single:19197::"none","deferred due to recent antihistamine use","labs sent instead"," "}  Spirometry: {Blank single:19197::"results normal (FEV1: ***%, FVC: ***%, FEV1/FVC: ***%)","results abnormal (FEV1: ***%, FVC: ***%, FEV1/FVC: ***%)"}.    {Blank single:19197::"Spirometry consistent with mild obstructive disease","Spirometry consistent with moderate obstructive disease","Spirometry consistent with severe obstructive disease","Spirometry consistent with possible restrictive disease","Spirometry consistent with mixed obstructive and restrictive disease","Spirometry uninterpretable due to technique","Spirometry consistent with normal pattern"}. {Blank single:19197::"Albuterol/Atrovent nebulizer","Xopenex/Atrovent nebulizer","Albuterol nebulizer","Albuterol four puffs via MDI","Xopenex four puffs via MDI"} treatment given in clinic with {Blank single:19197::"significant improvement in FEV1 per ATS criteria","significant improvement in FVC per ATS criteria","significant improvement in FEV1 and FVC per ATS  criteria","improvement in FEV1, but not significant per ATS criteria","improvement in FVC, but not significant per ATS criteria","improvement  in FEV1 and FVC, but not significant per ATS criteria","no improvement"}.  Allergy Studies: {Blank single:19197::"none","labs sent instead"," "}    {Blank single:19197::"Allergy testing results were read and interpreted by myself, documented by clinical staff."," "}      Salvatore Marvel, MD  [facility] [place] [facility]

## 2022-03-25 NOTE — Progress Notes (Signed)
[address], [address] [place] [address] Dept: [phone]  FOLLOW UP NOTE  Patient ID: Nancy Robertson, female    DOB: 1966/05/03  Age: 56 y.o. MRN: [id number] Date of Office Visit: 03/25/2022  Assessment  Chief Complaint: Asthma (4 mth f/u - Pretty good), Adverse effect of drug (4 mth f/u - Patient has avoided all allergen), Cough (X 5-6 wks Patient states when she wakes up - thick whitish phlegm.), and Sleep Apnea (Patient has received results - moderate sleep apnea)  HPI Nancy Robertson is a 56 year old female who presents to the clinic for a follow up visit. He was last seen in this clinic on 11/22/2021 by Dr. Ernst Bowler for evaluation of an oral penicillin challenge and prior to that she was seen in this clinic on 09/13/2021 by Dr. Ernst Bowler for evaluation of asthma, allergic rhinitis, food intolerance, and penicillin allergy. At today's visit, she reports that her asthma has been moderately well controlled with symptoms including wheeze occurring overnight and cough producing thick mucus in the morning. She continues Symbicort 80-2 puffs twice a day without a spacer and uses her albuterol infrequently with relief of symptoms when needed. Reflux is reported as moderately well controlled with no symptoms including heartburn or vomiting. She continues pantoprazole 40 mg once a day, however, she takes this medication after her evening meal and just before bedtime.  Allergic rhinitis is reported as moderately well controlled with occasional sneezing as the main symptom.  She continues cetirizine 10 mg once a day and Ryaltris 2 sprays in each nostril twice a day.  She is not currently using a nasal saline rinse. Her current medications are listed in the chart.    Drug Allergies:  Allergies  Allergen Reactions   Azithromycin Nausea Only   Erythromycin Nausea And Vomiting    Physical Exam: BP 124/80   Pulse 76   Temp 98.4 F (36.9 C)   Resp 16   Ht '5\' 7"'$  (1.702 m)   Wt 211 lb 3.2  oz (95.8 kg)   LMP 11/04/2011   SpO2 96%   BMI 33.08 kg/m    Physical Exam Vitals reviewed.  Constitutional:      Appearance: Normal appearance.  HENT:     Head: Normocephalic and atraumatic.     Right Ear: Tympanic membrane normal.     Left Ear: Tympanic membrane normal.     Nose:     Comments: Bilateral nares slightly erythematous with clear nasal drainage noted.  Pharynx normal.  Ears normal.  Eyes normal.    Mouth/Throat:     Pharynx: Oropharynx is clear.  Eyes:     Conjunctiva/sclera: Conjunctivae normal.  Cardiovascular:     Rate and Rhythm: Normal rate and regular rhythm.     Heart sounds: Normal heart sounds. No murmur heard. Pulmonary:     Effort: Pulmonary effort is normal.     Breath sounds: Normal breath sounds.     Comments: Lungs clear to auscultation Musculoskeletal:        General: Normal range of motion.     Cervical back: Normal range of motion and neck supple.  Skin:    General: Skin is warm and dry.  Neurological:     Mental Status: She is alert and oriented to person, place, and time.  Psychiatric:        Mood and Affect: Mood normal.        Behavior: Behavior normal.        Thought Content:  Thought content normal.        Judgment: Judgment normal.    Diagnostics: FVC 3.33, FEV1 2.40.  Predicted FVC 3.61, predicted FEV1 2.84.  Spirometry indicates normal ventilatory function.  Assessment and Plan: 1. Not well controlled moderate persistent asthma   2. Seasonal and perennial allergic rhinitis   3. Seasonal allergic conjunctivitis   4. Gastroesophageal reflux disease, unspecified whether esophagitis present     Meds ordered this encounter  Medications   albuterol (VENTOLIN HFA) 108 (90 Base) MCG/ACT inhaler    Sig: Inhale 2 puffs into the lungs every 4 (four) hours as needed for wheezing or shortness of breath.    Dispense:  1 each    Refill:  5    Patient Instructions  Asthma Continue Symbicort 80-2 puffs twice a day with a spacer to  prevent cough or wheeze Continue albuterol 2 puffs once every 4 hours as needed for cough or wheeze You may use albuterol 2 puffs 5 to 15 minutes before activity to decrease cough or wheeze  Allergic rhinitis Continue allergen avoidance measures directed toward mold, dust mite, cat, and dog as listed below Continue cetirizine 10 mg once a day as needed for runny nose or itch Continue Ryaltris 2 sprays in each nostril twice a day as needed for nasal symptoms Consider saline nasal rinses as needed for nasal symptoms. Use this before any medicated nasal sprays for best result  Allergic conjunctivitis Continue olopatadine 1 drop in each eye once a day as needed for red or itchy eyes  Reflux Continue dietary and lifestyle modifications as listed below Continue pantoprazole 40 mg once a day.  Take this medication 30 to 60 minutes before a meal  Call the clinic if this treatment plan is not working well for you.  Follow up in 2 months or sooner if needed.   Return in about 2 months (around 05/25/2022), or if symptoms worsen or fail to improve.    Thank you for the opportunity to care for this patient.  Please do not hesitate to contact me with questions.  Gareth Morgan, FNP [facility] [place] [facility]

## 2022-03-25 NOTE — Patient Instructions (Signed)
Asthma Continue Symbicort 80-2 puffs twice a day with a spacer to prevent cough or wheeze Continue albuterol 2 puffs once every 4 hours as needed for cough or wheeze You may use albuterol 2 puffs 5 to 15 minutes before activity to decrease cough or wheeze  Allergic rhinitis Continue allergen avoidance measures directed toward mold, dust mite, cat, and dog as listed below Continue cetirizine 10 mg once a day as needed for runny nose or itch Continue Ryaltris 2 sprays in each nostril twice a day as needed for nasal symptoms Consider saline nasal rinses as needed for nasal symptoms. Use this before any medicated nasal sprays for best result  Allergic conjunctivitis Continue olopatadine 1 drop in each eye once a day as needed for red or itchy eyes  Reflux Continue dietary and lifestyle modifications as listed below Continue pantoprazole 40 mg once a day.  Take this medication 30 to 60 minutes before a meal  Call the clinic if this treatment plan is not working well for you.  Follow up in 2 months or sooner if needed.   Lifestyle Changes for Controlling GERD When you have GERD, stomach acid feels as if it's backing up toward your mouth. Whether or not you take medication to control your GERD, your symptoms can often be improved with lifestyle changes.   Raise Your Head Reflux is more likely to strike when you're lying down flat, because stomach fluid can flow backward more easily. Raising the head of your bed 4-6 inches can help. To do this: Slide blocks or books under the legs at the head of your bed. Or, place a wedge under the mattress. Many foam stores can make a suitable wedge for you. The wedge should run from your waist to the top of your head. Don't just prop your head on several pillows. This increases pressure on your stomach. It can make GERD worse.  Watch Your Eating Habits Certain foods may increase the acid in your stomach or relax the lower esophageal sphincter,  making GERD more likely. It's best to avoid the following: Coffee, tea, and carbonated drinks (with and without caffeine) Fatty, fried, or spicy food Mint, chocolate, onions, and tomatoes Any other foods that seem to irritate your stomach or cause you pain  Relieve the Pressure Eat smaller meals, even if you have to eat more often. Don't lie down right after you eat. Wait a few hours for your stomach to empty. Avoid tight belts and tight-fitting clothes. Lose excess weight.  Tobacco and Alcohol Avoid smoking tobacco and drinking alcohol. They can make GERD symptoms worse.  Control of Mold Allergen Mold and fungi can grow on a variety of surfaces provided certain temperature and moisture conditions exist.  Outdoor molds grow on plants, decaying vegetation and soil.  The major outdoor mold, Alternaria and Cladosporium, are found in very high numbers during hot and dry conditions.  Generally, a late Summer - Fall peak is seen for common outdoor fungal spores.  Rain will temporarily lower outdoor mold spore count, but counts rise rapidly when the rainy period ends.  The most important indoor molds are Aspergillus and Penicillium.  Dark, humid and poorly ventilated basements are ideal sites for mold growth.  The next most common sites of mold growth are the bathroom and the kitchen.  Outdoor [employer] Use air conditioning and keep windows closed Avoid exposure to decaying vegetation. Avoid leaf raking. Avoid grain handling. Consider wearing a face mask if working in moldy areas.  Indoor  Mold Control Maintain humidity below 50%. Clean washable surfaces with 5% bleach solution. Remove sources e.g. Contaminated carpets.   Control of Dust Mite Allergen Dust mites play a major role in allergic asthma and rhinitis. They occur in environments with high humidity wherever human skin is found. Dust mites absorb humidity from the atmosphere (ie, they do not drink) and feed on organic matter  (including shed human and animal skin). Dust mites are a microscopic type of insect that you cannot see with the naked eye. High levels of dust mites have been detected from mattresses, pillows, carpets, upholstered furniture, bed covers, clothes, soft toys and any woven material. The principal allergen of the dust mite is found in its feces. A gram of dust may contain 1,000 mites and 250,000 fecal particles. Mite antigen is easily measured in the air during house cleaning activities. Dust mites do not bite and do not cause harm to humans, other than by triggering allergies/asthma.  Ways to decrease your exposure to dust mites in your home:  1. Encase mattresses, box springs and pillows with a mite-impermeable barrier or cover  2. Wash sheets, blankets and drapes weekly in hot water (130 F) with detergent and dry them in a dryer on the hot setting.  3. Have the room cleaned frequently with a vacuum cleaner and a damp dust-mop. For carpeting or rugs, vacuuming with a vacuum cleaner equipped with a high-efficiency particulate air (HEPA) filter. The dust mite allergic individual should not be in a room which is being cleaned and should wait 1 hour after cleaning before going into the room.  4. Do not sleep on upholstered furniture (eg, couches).  5. If possible removing carpeting, upholstered furniture and drapery from the home is ideal. Horizontal blinds should be eliminated in the rooms where the person spends the most time (bedroom, study, television room). Washable vinyl, roller-type shades are optimal.  6. Remove all non-washable stuffed toys from the bedroom. Wash stuffed toys weekly like sheets and blankets above.  7. Reduce indoor humidity to less than 50%. Inexpensive humidity monitors can be purchased at most hardware stores. Do not use a humidifier as can make the problem worse and are not recommended.  Control of Dog or Cat Allergen Avoidance is the best way to manage a dog or cat  allergy. If you have a dog or cat and are allergic to dog or cats, consider removing the dog or cat from the home. If you have a dog or cat but don't want to find it a new home, or if your family wants a pet even though someone in the household is allergic, here are some strategies that may help keep symptoms at bay:  Keep the pet out of your bedroom and restrict it to only a few rooms. Be advised that keeping the dog or cat in only one room will not limit the allergens to that room. Don't pet, hug or kiss the dog or cat; if you do, wash your hands with soap and water. High-efficiency particulate air (HEPA) cleaners run continuously in a bedroom or living room can reduce allergen levels over time. Regular use of a high-efficiency vacuum cleaner or a central vacuum can reduce allergen levels. Giving your dog or cat a bath at least once a week can reduce airborne allergen.

## 2022-03-31 DIAGNOSIS — G4733 Obstructive sleep apnea (adult) (pediatric): Secondary | ICD-10-CM | POA: Diagnosis not present

## 2022-04-02 ENCOUNTER — Encounter: Payer: Self-pay | Admitting: Family Medicine

## 2022-04-14 ENCOUNTER — Encounter: Payer: Self-pay | Admitting: Family Medicine

## 2022-04-14 DIAGNOSIS — E782 Mixed hyperlipidemia: Secondary | ICD-10-CM

## 2022-04-14 DIAGNOSIS — K760 Fatty (change of) liver, not elsewhere classified: Secondary | ICD-10-CM

## 2022-04-14 DIAGNOSIS — Z131 Encounter for screening for diabetes mellitus: Secondary | ICD-10-CM

## 2022-04-14 NOTE — Telephone Encounter (Signed)
Nurses I would recommend lipid, liver, glucose Hyperlipidemia screening for diabetes  If patient is interested in additional lab work please let me know otherwise I would go with the above

## 2022-04-26 DIAGNOSIS — E782 Mixed hyperlipidemia: Secondary | ICD-10-CM | POA: Diagnosis not present

## 2022-04-26 DIAGNOSIS — K760 Fatty (change of) liver, not elsewhere classified: Secondary | ICD-10-CM | POA: Diagnosis not present

## 2022-04-26 DIAGNOSIS — Z131 Encounter for screening for diabetes mellitus: Secondary | ICD-10-CM | POA: Diagnosis not present

## 2022-04-27 LAB — HEPATIC FUNCTION PANEL
ALT: 31 IU/L (ref 0–32)
AST: 22 IU/L (ref 0–40)
Albumin: 4.6 g/dL (ref 3.8–4.9)
Alkaline Phosphatase: 75 IU/L (ref 44–121)
Bilirubin Total: 0.3 mg/dL (ref 0.0–1.2)
Bilirubin, Direct: <0.1 mg/dL (ref 0.00–0.40)
Total Protein: 6.6 g/dL (ref 6.0–8.5)

## 2022-04-27 LAB — LIPID PANEL
Chol/HDL Ratio: 3.7 ratio (ref 0.0–4.4)
Cholesterol, Total: 169 mg/dL (ref 100–199)
HDL: 46 mg/dL (ref >39)
LDL Chol Calc (NIH): 91 mg/dL (ref 0–99)
Triglycerides: 189 mg/dL — ABNORMAL HIGH (ref 0–149)
VLDL Cholesterol Cal: 32 mg/dL (ref 5–40)

## 2022-04-27 LAB — GLUCOSE, RANDOM: Glucose: 102 mg/dL — ABNORMAL HIGH (ref 70–99)

## 2022-04-29 ENCOUNTER — Ambulatory Visit (INDEPENDENT_AMBULATORY_CARE_PROVIDER_SITE_OTHER): Payer: BC Managed Care – PPO | Admitting: Nurse Practitioner

## 2022-04-29 VITALS — BP 130/80 | HR 67 | Temp 98.1°F | Ht 67.0 in | Wt 214.0 lb

## 2022-04-29 DIAGNOSIS — Z01419 Encounter for gynecological examination (general) (routine) without abnormal findings: Secondary | ICD-10-CM | POA: Diagnosis not present

## 2022-04-29 DIAGNOSIS — L900 Lichen sclerosus et atrophicus: Secondary | ICD-10-CM | POA: Diagnosis not present

## 2022-04-29 DIAGNOSIS — Z1231 Encounter for screening mammogram for malignant neoplasm of breast: Secondary | ICD-10-CM | POA: Diagnosis not present

## 2022-04-29 DIAGNOSIS — G4730 Sleep apnea, unspecified: Secondary | ICD-10-CM | POA: Insufficient documentation

## 2022-04-29 NOTE — Progress Notes (Unsigned)
Subjective:    Patient ID: Nancy Robertson, female    DOB: 09/14/1966, 56 y.o.   MRN: [id number]  HPI  The patient comes in today for a wellness visit.    A review of their health history was completed.  A review of medications was also completed.  Any needed refills; No  Eating habits: doesn't miss any meals  Falls/  MVA accidents in past few months: No  Regular exercise: No  Specialist pt sees on regular basis: Yes  Preventative health issues were discussed.   Additional concerns: dx with sleep apnea at end of June and using CPAP  Working from home which is going well. Limited activity. Plans to start [facility] program soon. Has tried other programs but states this one is a long term program.  Regular dental and vision exams. Is due for mammogram.  History of lichen sclerosis in the perianal area. Has used topical Clobetasol in the past. No issues at this time but would like it checked. SH: hysterectomy with BSO. Has been doing well emotionally so she chose to reduce her sertraline dose to 25 mg daily.  Review of Systems  Constitutional:  Positive for fatigue. Negative for activity change and appetite change.  HENT:  Negative for sore throat and trouble swallowing.   Respiratory:  Negative for cough, chest tightness, shortness of breath and wheezing.   Cardiovascular:  Negative for chest pain.  Gastrointestinal:  Positive for constipation. Negative for abdominal distention, abdominal pain, diarrhea, nausea and vomiting.       Colonoscopy 2022  Genitourinary:  Negative for difficulty urinating, dysuria, enuresis, frequency, genital sores, pelvic pain, urgency, vaginal bleeding and vaginal discharge.       Objective:   Physical Exam Vitals and nursing note reviewed.  Constitutional:      General: She is not in acute distress.    Appearance: She is well-developed.  Neck:     Thyroid: No thyromegaly.     Trachea: No tracheal deviation.     Comments:  Thyroid non tender to palpation. No mass or goiter noted.  Cardiovascular:     Rate and Rhythm: Normal rate and regular rhythm.     Heart sounds: Normal heart sounds. No murmur heard. Pulmonary:     Effort: Pulmonary effort is normal.     Breath sounds: Normal breath sounds.  Chest:  Breasts:    Right: No swelling, inverted nipple, mass, skin change or tenderness.     Left: No swelling, inverted nipple, mass, skin change or tenderness.  Abdominal:     General: There is no distension.     Palpations: Abdomen is soft.     Tenderness: There is no abdominal tenderness.  Genitourinary:    Comments: Vulvar area: no rash, lesions or skin changes. Perianal area: dry, pale skin noted. No lesions. Vagina: pink and moist.  Musculoskeletal:     Cervical back: Normal range of motion and neck supple.  Lymphadenopathy:     Cervical: No cervical adenopathy.     Upper Body:     Right upper body: No supraclavicular, axillary or pectoral adenopathy.     Left upper body: No supraclavicular, axillary or pectoral adenopathy.  Skin:    General: Skin is warm and dry.     Findings: No rash.  Neurological:     Mental Status: She is alert and oriented to person, place, and time.  Psychiatric:        Mood and Affect: Mood normal.  Behavior: Behavior normal.        Thought Content: Thought content normal.        Judgment: Judgment normal.    Today's Vitals   04/29/22 1409  BP: 130/80  Pulse: 67  Temp: 98.1 F (36.7 C)  TempSrc: Temporal  SpO2: 96%  Weight: 214 lb (97.1 kg)  Height: '5\' 7"'$  (1.702 m)   Body mass index is 33.52 kg/m.        Assessment & Plan:   Problem List Items Addressed This Visit       Other   Lichen sclerosus et atrophicus   Other Visit Diagnoses     Well woman exam    -  Primary   Screening mammogram for breast cancer       Relevant Orders   MM Digital Screening      Meds ordered this encounter  Medications   clobetasol cream (TEMOVATE) 0.05 %     Sig: Apply a small amount to affected area 2-3 times per week prn    Dispense:  30 g    Refill:  0    Order Specific Question:   Supervising Provider    Answer:   Sallee Lange A [[id number]]   Use clobetasol cream as directed as needed.  Recheck if any changes in the skin or any lesions develop. Mammogram scheduled. Encourage patient to increase activity and to follow-up with [facility] weight loss program as planned. Return in about 6 months (around 10/30/2022).

## 2022-04-30 ENCOUNTER — Encounter: Payer: Self-pay | Admitting: Nurse Practitioner

## 2022-04-30 DIAGNOSIS — G4733 Obstructive sleep apnea (adult) (pediatric): Secondary | ICD-10-CM | POA: Diagnosis not present

## 2022-04-30 MED ORDER — CLOBETASOL PROPIONATE 0.05 % EX CREA: TOPICAL_CREAM | CUTANEOUS | 0 refills | Status: DC

## 2022-05-04 ENCOUNTER — Ambulatory Visit (HOSPITAL_COMMUNITY)
Admission: RE | Admit: 2022-05-04 | Discharge: 2022-05-04 | Disposition: A | Discharge status: Home / Self Care | Payer: BC Managed Care – PPO | Source: Ambulatory Visit | Attending: Nurse Practitioner | Admitting: Nurse Practitioner

## 2022-05-04 DIAGNOSIS — Z1231 Encounter for screening mammogram for malignant neoplasm of breast: Secondary | ICD-10-CM | POA: Insufficient documentation

## 2022-05-20 ENCOUNTER — Telehealth: Payer: Self-pay

## 2022-05-20 MED ORDER — RYALTRIS 665-25 MCG/ACT NA SUSP: 2.0000 | Freq: Two times a day (BID) | NASAL | 5 refills | Status: DC

## 2022-05-20 NOTE — Telephone Encounter (Signed)
[facility]yaltr[facility] [id number] MCG/ACT PA....  Approved: 05/13/22  Approvedon August 4 CaseId:[id number];Status:Approved;Review Type:Prior Auth;Coverage Start Date:04/13/2022;Coverage End Date:05/13/2023  Sending prescription to [facility].

## 2022-05-20 NOTE — Addendum Note (Signed)
Addended by: Tommas Olp B on: 05/20/2022 12:27 PM   Modules accepted: Orders

## 2022-05-24 ENCOUNTER — Ambulatory Visit
Admission: RE | Admit: 2022-05-24 | Discharge: 2022-05-24 | Disposition: A | Discharge status: Home / Self Care | Payer: BC Managed Care – PPO | Source: Ambulatory Visit | Attending: Nurse Practitioner | Admitting: Nurse Practitioner

## 2022-05-24 ENCOUNTER — Ambulatory Visit (INDEPENDENT_AMBULATORY_CARE_PROVIDER_SITE_OTHER): Payer: BC Managed Care – PPO

## 2022-05-24 VITALS — BP 144/73 | HR 88 | Temp 98.6°F | Resp 18

## 2022-05-24 DIAGNOSIS — J4531 Mild persistent asthma with (acute) exacerbation: Secondary | ICD-10-CM | POA: Diagnosis not present

## 2022-05-24 DIAGNOSIS — J45901 Unspecified asthma with (acute) exacerbation: Secondary | ICD-10-CM | POA: Diagnosis not present

## 2022-05-24 DIAGNOSIS — J45909 Unspecified asthma, uncomplicated: Secondary | ICD-10-CM | POA: Diagnosis not present

## 2022-05-24 DIAGNOSIS — R059 Cough, unspecified: Secondary | ICD-10-CM

## 2022-05-24 DIAGNOSIS — R062 Wheezing: Secondary | ICD-10-CM | POA: Diagnosis not present

## 2022-05-24 MED ORDER — PREDNISONE 50 MG PO TABS: ORAL_TABLET | ORAL | 0 refills | Status: DC

## 2022-05-24 MED ORDER — DOXYCYCLINE HYCLATE 100 MG PO TABS: 100.0000 mg | ORAL_TABLET | Freq: Two times a day (BID) | ORAL | 0 refills | Status: DC

## 2022-05-24 MED ORDER — ALBUTEROL SULFATE (2.5 MG/3ML) 0.083% IN NEBU: 2.5000 mg | INHALATION_SOLUTION | Freq: Four times a day (QID) | RESPIRATORY_TRACT | 3 refills | Status: DC | PRN

## 2022-05-24 MED ORDER — METHYLPREDNISOLONE SODIUM SUCC 125 MG IJ SOLR
125.0000 mg | Freq: Once | INTRAMUSCULAR | Status: AC
  Administered 2022-05-24: 125 mg via INTRAMUSCULAR

## 2022-05-24 NOTE — ED Triage Notes (Signed)
Asthma flare up since Sunday.  States she washed her dog last night and thinks this may have set off the asthma.  Albuterol inhaler doesn't seem to be working.

## 2022-05-24 NOTE — ED Provider Notes (Signed)
[facility]    CSN: [id number] Arrival date & time: 05/24/22  0805      History   Chief Complaint Chief Complaint  Patient presents with   Wheezing    Allergy/Asthma flare up started 8/13;Worse tonight. Need shot Mother having surgery for breast cancer on 8/21 and I need to be well as primary care person post surgery. Thanks - Entered by patient    HPI Nancy Robertson is a 56 y.o. female.   The history is provided by the patient.   Patient presents for asthma exacerbation has been present for the past 3 days.  She complains of shortness of breath, cough, and wheezing.  She denies fever, chills, productive cough, trouble breathing, or difficulty breathing.  Patient states she went to bathe her dog last evening at the local [employer] and the pet dander worsened her symptoms.  She has been using her rescue inhaler without good relief.  She is also on Symbicort for maintenance.  Past Medical History:  Diagnosis Date   Allergy    Asthma    seasonal only   Chest pain 04/17/2012   Dyspepsia 03/27/2019   GERD (gastroesophageal reflux disease)    Mixed hyperlipidemia 04/17/2012   Palpitations 04/17/2012   TMJ (dislocation of temporomandibular joint) 04/17/2012    Patient Active Problem List   Diagnosis Date Noted   Sleep apnea 67/89/3810   Lichen sclerosus et atrophicus 04/29/2022   Seasonal allergic conjunctivitis 03/25/2022   Seasonal and perennial allergic rhinitis 09/13/2021   Penicillin allergy 09/13/2021   Food intolerance 17/51/0258   Systolic murmur 52/77/8242   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   Irritable bowel syndrome with constipation 07/03/2021   Gastroesophageal reflux disease 07/02/2021   Anxiety and depression 07/02/2021   Moderate persistent asthma, uncomplicated 35/36/1443   Dyspepsia 03/27/2019   Hypertriglyceridemia 01/21/2014   Morbid obesity ([place]) 07/31/2013   Renal cyst, left 07/15/2013   Chronic low back pain 04/29/2013   Ischemic colitis ([place])  12/11/2012   Chest pain 04/17/2012   Mixed hyperlipidemia 04/17/2012   Palpitations 04/17/2012    Past Surgical History:  Procedure Laterality Date   BLADDER REPAIR  2010   BREAST BIOPSY Right    2020   COLONOSCOPY N/A 07/07/2016   Procedure: COLONOSCOPY;  Surgeon: Rogene Houston, MD;  Location[facility];  Service: Endoscopy;  Laterality: N/A;  1030    CYSTOSCOPY  11/10/2011   Procedure: CYSTOSCOPY;  Surgeon: Delice Lesch, MD;  Location: [facility];  Service: Gynecology;  Laterality: N/A;   KNEE SURGERY  2007   right   SALPINGOOPHORECTOMY  11/10/2011   Procedure: SALPINGO OOPHERECTOMY;  Surgeon: Delice Lesch, MD;  Location: [facility];  Service: Gynecology;  Laterality: Bilateral;   VAGINAL HYSTERECTOMY  11/10/2011   Procedure: HYSTERECTOMY VAGINAL;  Surgeon: Delice Lesch, MD;  Location: [facility];  Service: Gynecology;  Laterality: N/A;  Total Vaginal Hysterectomy, bilateral salpingo-oopherectomy, cystoscopy    OB History     Gravida  2   Para  2   Term      Preterm      AB      Living         SAB      IAB      Ectopic      Multiple      Live Births               Home Medications    Prior to Admission medications  Medication Sig Start Date End Date Taking? Authorizing Provider  albuterol (PROVENTIL) (2.5 MG/3ML) 0.083% nebulizer solution Take 3 mLs (2.5 mg total) by nebulization every 6 (six) hours as needed for wheezing or shortness of breath. 05/24/22  Yes Damiya Sandefur-Warren, Alda Lea, NP  doxycycline (VIBRA-TABS) 100 MG tablet Take 1 tablet (100 mg total) by mouth 2 (two) times daily for 7 days. 05/24/22 05/31/22 Yes Norinne Jeane-Warren, Alda Lea, NP  predniSONE (DELTASONE) 50 MG tablet Take 1 tablet daily with breakfast for 5 days. 05/24/22  Yes Addylin Manke-Warren, Alda Lea, NP  cetirizine (ZYRTEC) 10 MG chewable tablet Chew 10 mg by mouth daily.    [provider]  clobetasol cream (TEMOVATE) 0.05 % Apply a small amount to affected area 2-3 times per  week prn 04/30/22   Nilda Simmer, NP  Olopatadine HCl (PAZEO) 0.7 % SOLN Apply 1 drop to eye daily as needed. 09/13/21   Valentina Shaggy, MD  Olopatadine-Mometasone Rennie Plowman) [phone] MCG/ACT SUSP Place 2 sprays into the nose in the morning and at bedtime. 05/20/22   Valentina Shaggy, MD  pantoprazole (PROTONIX) 40 MG tablet TAKE 1 TABLET DAILY 03/21/22   Kathyrn Drown, MD  rosuvastatin (CRESTOR) 10 MG tablet Take 1 tablet (10 mg total) by mouth daily. For cholesterol 12/23/21   Kathyrn Drown, MD  sertraline (ZOLOFT) 50 MG tablet Take 1 tablet (50 mg total) by mouth daily. Patient taking differently: Take 50 mg by mouth daily. Taken 1/2 (25 mg) tablet by mouth once a day 12/24/21   Kathyrn Drown, MD  sucralfate (CARAFATE) 1 GM/10ML suspension SHAKE LIQUID AND TAKE 10 ML(1 GRAM) BY MOUTH FOUR TIMES DAILY AT BEDTIME WITH MEALS AS NEEDED FOR ACID REFLUX 08/21/21   Nilda Simmer, NP  SYMBICORT 80-4.5 MCG/ACT inhaler USE 2 INHALATIONS TWICE A DAY 12/24/21   Kathyrn Drown, MD    Family History Family History  Problem Relation Age of Onset   Hypertension Father    Diabetes Father    Hyperlipidemia Father    Lung cancer Father    COPD Father    Stroke Father    Colon polyps Father    Breast cancer Mother    Bladder Cancer Paternal Aunt    Bladder Cancer Paternal Uncle    Colon cancer Neg Hx    Esophageal cancer Neg Hx    Liver disease Neg Hx    Pancreatic cancer Neg Hx     Social History Social History   Tobacco Use   Smoking status: Never   Smokeless tobacco: Never  Vaping Use   Vaping Use: Never used  Substance Use Topics   Alcohol use: Yes    Comment: occasionally wine    Drug use: No     Allergies   Azithromycin and Erythromycin   Review of Systems Review of Systems Per HPI  Physical Exam Triage Vital Signs ED Triage Vitals  Enc Vitals Group     BP 05/24/22 0812 (!) 144/73     Pulse Rate 05/24/22 0812 90     Resp 05/24/22 0812 18     Temp  05/24/22 0812 98.6 F (37 C)     Temp Source 05/24/22 0812 Oral     SpO2 05/24/22 0812 93 %     Weight --      Height --      Head Circumference --      Peak Flow --      Pain Score 05/24/22 0818 0  Pain Loc --      Pain Edu? --      Excl. in [place]? --    No data found.  Updated Vital Signs BP (!) 144/73 (BP Location: Right Arm)   Pulse 88   Temp 98.6 F (37 C) (Oral)   Resp 18   LMP 11/04/2011   SpO2 94%   Visual Acuity Right Eye Distance:   Left Eye Distance:   Bilateral Distance:    Right Eye Near:   Left Eye Near:    Bilateral Near:     Physical Exam Vitals and nursing note reviewed.  Constitutional:      General: She is not in acute distress.    Appearance: Normal appearance.  HENT:     Head: Normocephalic.     Right Ear: Tympanic membrane, ear canal and external ear normal.     Left Ear: Tympanic membrane, ear canal and external ear normal.     Nose: No congestion.     Mouth/Throat:     Mouth: Mucous membranes are moist.  Eyes:     Extraocular Movements: Extraocular movements intact.     Conjunctiva/sclera: Conjunctivae normal.     Pupils: Pupils are equal, round, and reactive to light.  Cardiovascular:     Rate and Rhythm: Normal rate and regular rhythm.     Pulses: Normal pulses.     Heart sounds: Normal heart sounds.  Pulmonary:     Effort: Pulmonary effort is normal. No respiratory distress.     Breath sounds: Examination of the right-upper field reveals wheezing. Examination of the left-upper field reveals wheezing. Examination of the right-lower field reveals wheezing. Examination of the left-lower field reveals wheezing. Wheezing present.     Comments: Persistent cough noted during exam. Abdominal:     General: Bowel sounds are normal.     Palpations: Abdomen is soft.  Musculoskeletal:     Cervical back: Normal range of motion. No rigidity.  Neurological:     General: No focal deficit present.     Mental Status: She is alert and oriented to  person, place, and time.  Psychiatric:        Mood and Affect: Mood normal.      UC Treatments / Results  Labs (all labs ordered are listed, but only abnormal results are displayed) Labs Reviewed - No data to display  EKG   Radiology DG Chest 2 View  Result Date: 05/24/2022 CLINICAL DATA:  Asthma exacerbation, cough, wheezing EXAM: CHEST - 2 VIEW COMPARISON:  05/31/2019 FINDINGS: The heart size and mediastinal contours are within normal limits. Mild, diffuse bilateral interstitial pulmonary opacity. The visualized skeletal structures are unremarkable. IMPRESSION: Mild, diffuse bilateral interstitial pulmonary opacity, similar to prior, and may reflect atypical/viral infection or alternately edema. No focal airspace opacity. Electronically Signed   By: Delanna Ahmadi M.D.   On: 05/24/2022 08:52    Procedures Procedures (including critical care time)  Medications Ordered in UC Medications  methylPREDNISolone sodium succinate (SOLU-MEDROL) 125 mg/2 mL injection 125 mg (125 mg Intramuscular Given 05/24/22 0849)    Initial Impression / Assessment and Plan / UC Course  I have reviewed the triage vital signs and the nursing notes.  Pertinent labs & imaging results that were available during my care of the patient were reviewed by me and considered in my medical decision making (see chart for details).  Patient presents for acute asthma exacerbation.  Patient presents with persistent cough.  Wheezing is noted throughout on her exam.  She is in no acute distress, her vital signs are stable.  Solu-Medrol 125 mg IM injection was given in the clinic.  Will start patient on doxycycline for prophylaxis of bacterial infection, in addition to prednisone and albuterol nebulizer.  Supportive care recommendations were provided to the patient.  Patient advised to follow-up with her primary care physician within the next 7 to 10 days for recheck.  Strict indications of when to go to the emergency  department were provided for the patient. Final Clinical Impressions(s) / UC Diagnoses   Final diagnoses:  Mild persistent asthma with acute exacerbation     Discharge Instructions      Your x-ray suggest a possible viral infection. Take medication as prescribed.  I am treating you with antibiotic at this time to cover for any possible bacterial etiology. Increase fluids and allow for plenty of rest. Continue to use your Symbicort and albuterol inhaler as needed. Go to the emergency department immediately if you develop worsening shortness of breath, difficulty breathing, inability to speak in a complete sentence, or other concerns. Follow-up with your primary care physician for reevaluation within the next 7 to 10 days.     ED Prescriptions     Medication Sig Dispense Auth. Provider   predniSONE (DELTASONE) 50 MG tablet Take 1 tablet daily with breakfast for 5 days. 5 tablet Kyilee Gregg-Warren, Alda Lea, NP   doxycycline (VIBRA-TABS) 100 MG tablet Take 1 tablet (100 mg total) by mouth 2 (two) times daily for 7 days. 14 tablet Ladanian Kelter-Warren, Alda Lea, NP   albuterol (PROVENTIL) (2.5 MG/3ML) 0.083% nebulizer solution Take 3 mLs (2.5 mg total) by nebulization every 6 (six) hours as needed for wheezing or shortness of breath. 75 mL Charlie Seda-Warren, Alda Lea, NP      PDMP not reviewed this encounter.   Tish Men, NP 05/24/22 [phone]

## 2022-05-24 NOTE — Discharge Instructions (Addendum)
Your x-ray suggest a possible viral infection. Take medication as prescribed.  I am treating you with antibiotic at this time to cover for any possible bacterial etiology. Increase fluids and allow for plenty of rest. Continue to use your Symbicort and albuterol inhaler as needed. Go to the emergency department immediately if you develop worsening shortness of breath, difficulty breathing, inability to speak in a complete sentence, or other concerns. Follow-up with your primary care physician for reevaluation within the next 7 to 10 days.

## 2022-05-25 ENCOUNTER — Encounter: Payer: Self-pay | Admitting: Family Medicine

## 2022-05-27 ENCOUNTER — Ambulatory Visit: Payer: BC Managed Care – PPO | Admitting: Family Medicine

## 2022-05-27 ENCOUNTER — Encounter: Payer: Self-pay | Admitting: Family Medicine

## 2022-05-27 ENCOUNTER — Ambulatory Visit (INDEPENDENT_AMBULATORY_CARE_PROVIDER_SITE_OTHER): Payer: BC Managed Care – PPO | Admitting: Family Medicine

## 2022-05-27 VITALS — BP 110/64 | HR 101

## 2022-05-27 DIAGNOSIS — J4521 Mild intermittent asthma with (acute) exacerbation: Secondary | ICD-10-CM

## 2022-05-27 DIAGNOSIS — J019 Acute sinusitis, unspecified: Secondary | ICD-10-CM

## 2022-05-27 MED ORDER — METHYLPREDNISOLONE ACETATE 40 MG/ML IJ SUSP
40.0000 mg | Freq: Once | INTRAMUSCULAR | Status: AC
  Administered 2022-05-27: 40 mg via INTRAMUSCULAR

## 2022-05-27 MED ORDER — AMOXICILLIN-POT CLAVULANATE 875-125 MG PO TABS: 1.0000 | ORAL_TABLET | Freq: Two times a day (BID) | ORAL | 0 refills | Status: DC

## 2022-05-27 MED ORDER — FLUCONAZOLE 150 MG PO TABS: 150.0000 mg | ORAL_TABLET | Freq: Once | ORAL | 1 refills | Status: AC

## 2022-05-27 MED ORDER — BUDESONIDE-FORMOTEROL FUMARATE 160-4.5 MCG/ACT IN AERO
INHALATION_SPRAY | RESPIRATORY_TRACT | 4 refills | Status: DC
Start: 2022-05-27 — End: 2022-11-23

## 2022-05-27 NOTE — Progress Notes (Unsigned)
   Subjective:    Patient ID: Nancy Robertson, female    DOB: Jan 31, 1966, 56 y.o.   MRN: [id number]  HPI Pt arrives today with cough and wheezing. Pt went to Urgent Care on Tuesday morning. Chest xray obtained. Pt states yesterday she had improved but something happened through out the night. Pt is on day 3 of 5 with prednisone.   Patient with a lot of coughing congestion feeling tight in the lungs denies high fevers Review of Systems     Objective:   Physical Exam Coarse cough noted intermittent wheeze noted no respiratory distress HEENT benign       Assessment & Plan:  Overall I believe that this is a respiratory illness probably viral that is triggered all of this recommend thorough follow-up if ongoing troubles Depo-Medrol shot finish out the prednisone and antibiotic switch antibiotics call if any ongoing troubles no need for lab testing currently

## 2022-05-27 NOTE — Telephone Encounter (Signed)
Pt contacted and scheduled office visit for this afternoon

## 2022-05-29 LAB — COVID-19, FLU A+B AND RSV
Influenza A, NAA: NOT DETECTED
Influenza B, NAA: NOT DETECTED
RSV, NAA: NOT DETECTED
SARS-CoV-2, NAA: NOT DETECTED

## 2022-05-30 ENCOUNTER — Other Ambulatory Visit: Payer: Self-pay | Admitting: Family Medicine

## 2022-05-31 DIAGNOSIS — G4733 Obstructive sleep apnea (adult) (pediatric): Secondary | ICD-10-CM | POA: Diagnosis not present

## 2022-06-08 ENCOUNTER — Encounter: Payer: Self-pay | Admitting: Family Medicine

## 2022-06-09 MED ORDER — PANTOPRAZOLE SODIUM 40 MG PO TBEC: 40.0000 mg | DELAYED_RELEASE_TABLET | Freq: Every day | ORAL | 1 refills | Status: DC

## 2022-06-20 DIAGNOSIS — E669 Obesity, unspecified: Secondary | ICD-10-CM | POA: Diagnosis not present

## 2022-06-20 DIAGNOSIS — G4733 Obstructive sleep apnea (adult) (pediatric): Secondary | ICD-10-CM | POA: Diagnosis not present

## 2022-06-22 ENCOUNTER — Ambulatory Visit
Admission: RE | Admit: 2022-06-22 | Discharge: 2022-06-22 | Disposition: A | Discharge status: Home / Self Care | Payer: BC Managed Care – PPO | Source: Ambulatory Visit | Attending: Family Medicine | Admitting: Family Medicine

## 2022-06-22 VITALS — BP 120/69 | HR 76 | Temp 98.0°F | Resp 16

## 2022-06-22 DIAGNOSIS — N390 Urinary tract infection, site not specified: Secondary | ICD-10-CM | POA: Insufficient documentation

## 2022-06-22 LAB — POCT URINALYSIS DIP (MANUAL ENTRY)
Bilirubin, UA: NEGATIVE
Glucose, UA: NEGATIVE mg/dL
Ketones, POC UA: NEGATIVE mg/dL
Nitrite, UA: NEGATIVE
Protein Ur, POC: NEGATIVE mg/dL
Spec Grav, UA: 1.01 (ref 1.010–1.025)
Urobilinogen, UA: 0.2 E.U./dL
pH, UA: 6 (ref 5.0–8.0)

## 2022-06-22 MED ORDER — SULFAMETHOXAZOLE-TRIMETHOPRIM 800-160 MG PO TABS: 1.0000 | ORAL_TABLET | Freq: Two times a day (BID) | ORAL | 0 refills | Status: AC

## 2022-06-22 NOTE — ED Provider Notes (Signed)
[facility]    CSN: [id number] Arrival date & time: 06/22/22  1103      History   Chief Complaint Chief Complaint  Patient presents with   Urinary Frequency    Possible UTI.  Started late afternoon 9/8; Taking AZO and Cystex since Friday night. Some better but not cleared. Lower back started aching today-need RX. - Entered by patient    HPI Nancy Robertson is a 56 y.o. female.   Patient presenting today with 4-day history of dysuria, urinary frequency, suprapubic pressure.  Now also having some lower back soreness bilaterally.  Denies fever, chills, nausea, vomiting, hematuria.  Taking Azo and Cystex with mild temporary relief of symptoms.  Trying to drink more fluids.    Past Medical History:  Diagnosis Date   Allergy    Asthma    seasonal only   Chest pain 04/17/2012   Dyspepsia 03/27/2019   GERD (gastroesophageal reflux disease)    Mixed hyperlipidemia 04/17/2012   Palpitations 04/17/2012   TMJ (dislocation of temporomandibular joint) 04/17/2012    Patient Active Problem List   Diagnosis Date Noted   Sleep apnea 44/12/4740   Lichen sclerosus et atrophicus 04/29/2022   Seasonal allergic conjunctivitis 03/25/2022   Seasonal and perennial allergic rhinitis 09/13/2021   Penicillin allergy 09/13/2021   Food intolerance 59/56/3875   Systolic murmur 64/33/2951   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   Irritable bowel syndrome with constipation 07/03/2021   Gastroesophageal reflux disease 07/02/2021   Anxiety and depression 07/02/2021   Moderate persistent asthma, uncomplicated 88/41/6606   Dyspepsia 03/27/2019   Hypertriglyceridemia 01/21/2014   Morbid obesity ([place]) 07/31/2013   Renal cyst, left 07/15/2013   Chronic low back pain 04/29/2013   Ischemic colitis ([place]) 12/11/2012   Chest pain 04/17/2012   Mixed hyperlipidemia 04/17/2012   Palpitations 04/17/2012    Past Surgical History:  Procedure Laterality Date   BLADDER REPAIR  2010   BREAST BIOPSY Right    2020    COLONOSCOPY N/A 07/07/2016   Procedure: COLONOSCOPY;  Surgeon: Rogene Houston, MD;  [facility];  Service: Endoscopy;  Laterality: N/A;  1030    CYSTOSCOPY  11/10/2011   Procedure: CYSTOSCOPY;  Surgeon: Delice Lesch, MD;  Location: [facility];  Service: Gynecology;  Laterality: N/A;   KNEE SURGERY  2007   right   SALPINGOOPHORECTOMY  11/10/2011   Procedure: SALPINGO OOPHERECTOMY;  Surgeon: Delice Lesch, MD;  Location: [facility];  Service: Gynecology;  Laterality: Bilateral;   VAGINAL HYSTERECTOMY  11/10/2011   Procedure: HYSTERECTOMY VAGINAL;  Surgeon: Delice Lesch, MD;  Location: [facility];  Service: Gynecology;  Laterality: N/A;  Total Vaginal Hysterectomy, bilateral salpingo-oopherectomy, cystoscopy    OB History     Gravida  2   Para  2   Term      Preterm      AB      Living         SAB      IAB      Ectopic      Multiple      Live Births               Home Medications    Prior to Admission medications   Medication Sig Start Date End Date Taking? Authorizing Provider  sulfamethoxazole-trimethoprim (BACTRIM DS) 800-160 MG tablet Take 1 tablet by mouth 2 (two) times daily for 3 days. 06/22/22 06/25/22 Yes Volney American, PA-C  albuterol (PROVENTIL) (2.5 MG/3ML) 0.083% nebulizer  solution Take 3 mLs (2.5 mg total) by nebulization every 6 (six) hours as needed for wheezing or shortness of breath. 05/24/22   Leath-Warren, Alda Lea, NP  amoxicillin-clavulanate (AUGMENTIN) 875-125 MG tablet Take 1 tablet by mouth 2 (two) times daily. 05/27/22   Kathyrn Drown, MD  budesonide-formoterol [facility]) 160-4.5 MCG/ACT inhaler 2 puff bid 05/27/22   Kathyrn Drown, MD  cetirizine (ZYRTEC) 10 MG chewable tablet Chew 10 mg by mouth daily.    [provider]  clobetasol cream (TEMOVATE) 0.05 % Apply a small amount to affected area 2-3 times per week prn 04/30/22   Nilda Simmer, NP  Olopatadine HCl (PAZEO) 0.7 % SOLN Apply 1 drop to eye  daily as needed. 09/13/21   Valentina Shaggy, MD  Olopatadine-Mometasone Renn[facility] Plowman) [phone] MCG/ACT SUSP Place 2 sprays into the nose in the morning and at bedtime. 05/20/22   Valentina Shaggy, MD  pantoprazole (PROTONIX) 40 MG tablet Take 1 tablet (40 mg total) by mouth daily. 06/09/22   Kathyrn Drown, MD  predniSONE (DELTASONE) 50 MG tablet Take 1 tablet daily with breakfast for 5 days. 05/24/22   Leath-Warren, Alda Lea, NP  rosuvastatin (CRESTOR) 10 MG tablet TAKE 1 TABLET DAILY FOR CHOLESTEROL 05/30/22   Kathyrn Drown, MD  sertraline (ZOLOFT) 50 MG tablet TAKE 1 TABLET DAILY 05/30/22   Kathyrn Drown, MD  sucralfate (CARAFATE) 1 GM/10ML suspension SHAKE LIQUID AND TAKE 10 ML(1 GRAM) BY MOUTH FOUR TIMES DAILY AT BEDTIME WITH MEALS AS NEEDED FOR ACID REFLUX 08/21/21   Nilda Simmer, NP    Family History Family History  Problem Relation Age of Onset   Hypertension Father    Diabetes Father    Hyperlipidemia Father    Lung cancer Father    COPD Father    Stroke Father    Colon polyps Father    Breast cancer Mother    Bladder Cancer Paternal Aunt    Bladder Cancer Paternal Uncle    Colon cancer Neg Hx    Esophageal cancer Neg Hx    Liver disease Neg Hx    Pancreatic cancer Neg Hx     Social History Social History   Tobacco Use   Smoking status: Never   Smokeless tobacco: Never  Vaping Use   Vaping Use: Never used  Substance Use Topics   Alcohol use: Yes    Comment: occasionally wine    Drug use: No     Allergies   Azithromycin and Erythromycin   Review of Systems Review of Systems Per HPI  Physical Exam Triage Vital Signs ED Triage Vitals  Enc Vitals Group     BP 06/22/22 1108 120/69     Pulse Rate 06/22/22 1108 76     Resp 06/22/22 1108 16     Temp 06/22/22 1108 98 F (36.7 C)     Temp Source 06/22/22 1108 Oral     SpO2 06/22/22 1108 95 %     Weight --      Height --      Head Circumference --      Peak Flow --      Pain Score 06/22/22  1110 1     Pain Loc --      Pain Edu? --      Excl. in [place]? --    No data found.  Updated Vital Signs BP 120/69 (BP Location: Right Arm)   Pulse 76   Temp 98 F (36.7 C) (Oral)  Resp 16   LMP 11/04/2011   SpO2 95%   Visual Acuity Right Eye Distance:   Left Eye Distance:   Bilateral Distance:    Right Eye Near:   Left Eye Near:    Bilateral Near:     Physical Exam Vitals and nursing note reviewed.  Constitutional:      Appearance: Normal appearance. She is not ill-appearing.  HENT:     Head: Atraumatic.     Mouth/Throat:     Mouth: Mucous membranes are moist.  Eyes:     Extraocular Movements: Extraocular movements intact.     Conjunctiva/sclera: Conjunctivae normal.  Cardiovascular:     Rate and Rhythm: Normal rate and regular rhythm.     Heart sounds: Normal heart sounds.  Pulmonary:     Effort: Pulmonary effort is normal.     Breath sounds: Normal breath sounds.  Abdominal:     General: Bowel sounds are normal. There is no distension.     Palpations: Abdomen is soft.     Tenderness: There is no abdominal tenderness. There is no right CVA tenderness, left CVA tenderness or guarding.  Musculoskeletal:        General: Normal range of motion.     Cervical back: Normal range of motion and neck supple.  Skin:    General: Skin is warm and dry.  Neurological:     Mental Status: She is alert and oriented to person, place, and time.  Psychiatric:        Mood and Affect: Mood normal.        Thought Content: Thought content normal.        Judgment: Judgment normal.      UC Treatments / Results  Labs (all labs ordered are listed, but only abnormal results are displayed) Labs Reviewed  POCT URINALYSIS DIP (MANUAL ENTRY) - Abnormal; Notable for the following components:      Result Value   Blood, UA small (*)    Leukocytes, UA Trace (*)    All other components within normal limits  URINE CULTURE    EKG   Radiology No results  found.  Procedures Procedures (including critical care time)  Medications Ordered in UC Medications - No data to display  Initial Impression / Assessment and Plan / UC Course  I have reviewed the triage vital signs and the nursing notes.  Pertinent labs & imaging results that were available during my care of the patient were reviewed by me and considered in my medical decision making (see chart for details).     Urinalysis with trace leuks and blood, urine culture pending, treat for urinary tract infection based on symptoms and urinary findings with Bactrim, fluids, Azo as needed.  Adjust if needed based on urine culture.  Return for worsening symptoms.  Final Clinical Impressions(s) / UC Diagnoses   Final diagnoses:  Acute lower UTI   Discharge Instructions   None    ED Prescriptions     Medication Sig Dispense Auth. Provider   sulfamethoxazole-trimethoprim (BACTRIM DS) 800-160 MG tablet Take 1 tablet by mouth 2 (two) times daily for 3 days. 6 tablet Volney American, [place]      PDMP not reviewed this encounter.   Volney American, [place] 06/22/22 1434

## 2022-06-22 NOTE — ED Triage Notes (Signed)
Pain on urination since Friday.  Took AZO on Friday.  Tired cystecks on Sunday.  Urinary urgent and lower back pain.

## 2022-06-24 LAB — URINE CULTURE: Culture: 50000 — AB

## 2022-07-01 DIAGNOSIS — G4733 Obstructive sleep apnea (adult) (pediatric): Secondary | ICD-10-CM | POA: Diagnosis not present

## 2022-07-29 ENCOUNTER — Telehealth: Payer: Self-pay | Admitting: Genetic Counselor

## 2022-07-29 ENCOUNTER — Encounter: Payer: Self-pay | Admitting: Genetic Counselor

## 2022-07-29 NOTE — Telephone Encounter (Signed)
Patient called to set up genetics appt due to mom's CHEK2 mutation.  Appt scheduled for 11/6 at 3pm.  

## 2022-08-12 ENCOUNTER — Encounter: Payer: Self-pay | Admitting: Nurse Practitioner

## 2022-08-12 ENCOUNTER — Other Ambulatory Visit: Payer: Self-pay | Admitting: Nurse Practitioner

## 2022-08-12 MED ORDER — LINACLOTIDE 145 MCG PO CAPS: 145.0000 ug | ORAL_CAPSULE | Freq: Every day | ORAL | 2 refills | Status: DC

## 2022-08-12 NOTE — Progress Notes (Signed)
See mychart messages

## 2022-08-15 ENCOUNTER — Inpatient Hospital Stay: Payer: BC Managed Care – PPO | Attending: Genetic Counselor | Admitting: Genetic Counselor

## 2022-08-15 ENCOUNTER — Other Ambulatory Visit: Payer: Self-pay | Admitting: Genetic Counselor

## 2022-08-15 ENCOUNTER — Inpatient Hospital Stay: Payer: BC Managed Care – PPO

## 2022-08-15 DIAGNOSIS — Z803 Family history of malignant neoplasm of breast: Secondary | ICD-10-CM

## 2022-08-15 DIAGNOSIS — Z8042 Family history of malignant neoplasm of prostate: Secondary | ICD-10-CM | POA: Diagnosis not present

## 2022-08-15 DIAGNOSIS — Z8041 Family history of malignant neoplasm of ovary: Secondary | ICD-10-CM | POA: Diagnosis not present

## 2022-08-15 DIAGNOSIS — Z8481 Family history of carrier of genetic disease: Secondary | ICD-10-CM

## 2022-08-15 DIAGNOSIS — Z8052 Family history of malignant neoplasm of bladder: Secondary | ICD-10-CM

## 2022-08-15 LAB — GENETIC SCREENING ORDER

## 2022-08-19 ENCOUNTER — Encounter: Payer: Self-pay | Admitting: Genetic Counselor

## 2022-08-19 DIAGNOSIS — Z8041 Family history of malignant neoplasm of ovary: Secondary | ICD-10-CM

## 2022-08-19 DIAGNOSIS — Z803 Family history of malignant neoplasm of breast: Secondary | ICD-10-CM

## 2022-08-19 DIAGNOSIS — Z8481 Family history of carrier of genetic disease: Secondary | ICD-10-CM | POA: Insufficient documentation

## 2022-08-19 HISTORY — DX: Family history of malignant neoplasm of ovary: Z80.41

## 2022-08-19 HISTORY — DX: Family history of malignant neoplasm of breast: Z80.3

## 2022-08-19 NOTE — Progress Notes (Signed)
REFERRING PROVIDER: Self-referred  PRIMARY PROVIDER:  Kathyrn Drown, MD  PRIMARY REASON FOR VISIT:  1. Family history of CHEK2 gene mutation   2. Family history of breast cancer   3. Family history of ovarian cancer   4. Family history of prostate cancer   5. Family history of bladder cancer     HISTORY OF PRESENT ILLNESS:   Nancy Robertson, a 56 y.o. female, was seen for a [place] cancer genetics due to a family history of a known CHEK2 mutation in her mother.  Nancy Robertson presents to clinic today to discuss the possibility of a hereditary predisposition to cancer, to discuss genetic testing, and to further clarify her future cancer risks, as well as potential cancer risks for family members.   Nancy Robertson is a 55 y.o. female with no personal history of cancer.    CANCER HISTORY:  Oncology History   No history exists.    RISK FACTORS:  Mammogram within the last year: yes; category c breast density  Number of breast biopsies:  2; R breast biopsies in 2020 and 2014 . Colonoscopy: yes;  most recent in 2022; 5 colon polyps; f/u in 5 years . Hysterectomy: yes in 2013 Ovaries intact: no. BSO in 2013 Menarche was at age 55.  First live birth at age 35.  HRT use: 2 years; stopped around age 80 No dermatology screening.   Past Medical History:  Diagnosis Date   Allergy    Asthma    seasonal only   Chest pain 04/17/2012   Dyspepsia 03/27/2019   Family history of breast cancer 08/19/2022   Family history of ovarian cancer 08/19/2022   GERD (gastroesophageal reflux disease)    Mixed hyperlipidemia 04/17/2012   Palpitations 04/17/2012   TMJ (dislocation of temporomandibular joint) 04/17/2012    Past Surgical History:  Procedure Laterality Date   BLADDER REPAIR  2010   BREAST BIOPSY Right    2020   COLONOSCOPY N/A 07/07/2016   Procedure: COLONOSCOPY;  Surgeon: Rogene Houston, MD;  [facility];  Service: Endoscopy;  Laterality: N/A;  1030    CYSTOSCOPY   11/10/2011   Procedure: CYSTOSCOPY;  Surgeon: Delice Lesch, MD;  Location: [facility];  Service: Gynecology;  Laterality: N/A;   KNEE SURGERY  2007   right   SALPINGOOPHORECTOMY  11/10/2011   Procedure: SALPINGO OOPHERECTOMY;  Surgeon: Delice Lesch, MD;  Location: [facility];  Service: Gynecology;  Laterality: Bilateral;   VAGINAL HYSTERECTOMY  11/10/2011   Procedure: HYSTERECTOMY VAGINAL;  Surgeon: Delice Lesch, MD;  Location: [facility];  Service: Gynecology;  Laterality: N/A;  Total Vaginal Hysterectomy, bilateral salpingo-oopherectomy, cystoscopy    FAMILY HISTORY:  We obtained a detailed, 4-generation family history.  Significant diagnoses are listed below: Family History  Problem Relation Age of Onset   Breast cancer Mother        two primaries; dx 40 and 28; CHEK2 mutation   Lung cancer Father    Ovarian cancer Maternal Aunt 40   Bladder Cancer Paternal Aunt        father's pat half sibling   Cancer Paternal Aunt        unknown primary/ father's mat half sibling   Bladder Cancer Paternal Uncle    Prostate cancer Paternal Uncle    Cancer Other        PGF's siblings w/ breast, stomach, and lung cancers   Lymphoma Other        MGM's sister  Cancer Cousin        paternal cousins w/ prostate, throat, skin, lung cancers     Nancy Robertson's mother had genetic testing which showed a mutation in CHEK2 called c.1263del (p.Ser422Valfs*15).  The pathogenic variant in RAD51C, which was previously detected in Nancy Robertson's maternal aunt, was not detected in her mother's sample. No other pathogenic variants detected in [facility] Multi-Cancer +RNA Panel.  The Multi-Cancer + RNA Panel offered by [facility] includes sequencing and/or deletion/duplication analysis of the following 84 genes:  AIP*, ALK, APC*, ATM*, AXIN2*, BAP1*, BARD1*, BLM*, BMPR1A*, BRCA1*, BRCA2*, BRIP1*, CASR, CDC73*, CDH1*, CDK4, CDKN1B*, CDKN1C*, CDKN2A, CEBPA, CHEK2*, CTNNA1*, DICER1*, DIS3L2*, EGFR, EPCAM, FH*, FLCN*, GATA2*,  GPC3, GREM1, HOXB13, HRAS, KIT, MAX*, MEN1*, MET, MITF, MLH1*, MSH2*, MSH3*, MSH6*, MUTYH*, NBN*, NF1*, NF2*, NTHL1*, PALB2*, PDGFRA, PHOX2B, PMS2*, POLD1*, POLE*, POT1*, PRKAR1A*, PTCH1*, PTEN*, RAD50*, RAD51C*, RAD51D*, RB1*, RECQL4, RET, RUNX1*, SDHA*, SDHAF2*, SDHB*, SDHC*, SDHD*, SMAD4*, SMARCA4*, SMARCB1*, SMARCE1*, STK11*, SUFU*, TERC, TERT, TMEM127*, Tp53*, TSC1*, TSC2*, VHL*, WRN*, and WT1.  RNA analysis is performed for * genes.  Nancy Robertson is unaware of other previous family history of genetic testing for hereditary cancer risks.  There is no reported Ashkenazi Jewish ancestry. There is no known consanguinity.  GENETIC COUNSELING ASSESSMENT: Nancy Robertson is a 56 y.o. female with a maternal family history of a known CHEK2 mutation and a paternal family which is somewhat suggestive of an additional hereditary cancer syndrome. We, therefore, discussed and recommended the following at today's visit.   DISCUSSION: We discussed that 5 - 10% of cancer is hereditary.  Given her mother has a mutation in the CHEK2 gene, she has a 50% chance of having the same family mutation in [place].  We reviewed the breast, colon, and prostate cancer risks and management strategies associated with CHEK2 mutations.  There are other genes that can be associated with hereditary breast and prostate cancer syndromes, including but not limited to BRCA1/2.  We discussed that testing is beneficial for several reasons, including knowing about other cancer risks, identifying potential screening and risk-reduction options that may be appropriate, and to understanding if other family members could be at risk for cancer and allowing them to undergo genetic testing.  We reviewed the characteristics, features and inheritance patterns of hereditary cancer syndromes. We also discussed genetic testing, including the appropriate family members to test, the process of testing, insurance coverage and turn-around-time for results. We  discussed the implications of a negative, positive, carrier and/or variant of uncertain significant result.  We recommended Nancy Robertson pursue genetic testing for CHEK2 (given her maternal history) and other genes associated with breast, prostate, bladder, and other cancers (given her paternal history).   The Multi-Cancer + RNA Panel offered by [facility] includes sequencing and/or deletion/duplication analysis of the following 70 genes:  AIP*, ALK, APC*, ATM*, AXIN2*, BAP1*, BARD1*, BLM*, BMPR1A*, BRCA1*, BRCA2*, BRIP1*, CDC73*, CDH1*, CDK4, CDKN1B*, CDKN2A, CHEK2*, CTNNA1*, DICER1*, EPCAM, EGFR, FH*, FLCN*, GREM1, HOXB13, KIT, LZTR1, MAX*, MBD4, MEN1*, MET, MITF, MLH1*, MSH2*, MSH3*, MSH6*, MUTYH*, NF1*, NF2*, NTHL1*, PALB2*, PDGFRA, PMS2*, POLD1*, POLE*, POT1*, PRKAR1A*, PTCH1*, PTEN*, RAD51C*, RAD51D*, RB1*, RET, SDHA*, SDHAF2*, SDHB*, SDHC*, SDHD*, SMAD4*, SMARCA4*, SMARCB1*, SMARCE1*, STK11*, SUFU*, TMEM127*, TP53*, TSC1*, TSC2*, VHL*. RNA analysis is performed for * genes.  Based on Nancy Robertson's maternal family history of a CHEK2 mutation and paternal family history of cancer, she meets medical criteria for panel-based genetic testing. Despite that she meets criteria, she may still have an out of pocket cost. We discussed that  if her out of pocket cost for testing is over $100, the laboratory should contact them to discuss self-pay options and/or patient pay assistance programs.   We discussed that some people do not want to undergo genetic testing due to fear of genetic discrimination.  A federal law called the Genetic Information Non-Discrimination Act (GINA) of 2008 helps protect individuals against genetic discrimination based on their genetic test results.  It impacts both health insurance and employment.  With health insurance, it protects against increased premiums, being kicked off insurance or being forced to take a test in order to be insured.  For employment it protects against hiring, firing  and promoting decisions based on genetic test results.  GINA does not apply to those in the [employer], those who work for companies with less than 15 employees, and new life insurance or long-term disability insurance policies.  Health status due to a cancer diagnosis is not protected under GINA.  PLAN: After considering the risks, benefits, and limitations, Ms. Quentin Robertson provided informed consent to pursue genetic testing and the blood sample was sent to [facility] for analysis of the Multi-Cancer +RNA Panel. Results should be available within approximately 3 weeks' time, at which point they will be disclosed by telephone to Nancy Robertson, as will any additional recommendations warranted by these results. Nancy Robertson will receive a summary of her genetic counseling visit and a copy of her results once available. This information will also be available in Epic.   Nancy Robertson questions were answered to her satisfaction today. Our contact information was provided should additional questions or concerns arise. Thank you for the referral and allowing [place] to share in the care of your patient.   Cari M. Joette Catching, [place], [facility] Genetic Counselor [email or web address] ([phone]   The patient was seen for a total of 35 minutes in face-to-face genetic counseling.  She was accompanied by her brother, Elta [place]. Drs. Lindi Adie and/or Burr Medico were available to discuss this case as needed.  _______________________________________________________________________ For Office Staff:  Number of people involved in session: 2 Was an Intern/ student involved with case: no

## 2022-08-26 ENCOUNTER — Telehealth: Payer: Self-pay | Admitting: Genetic Counselor

## 2022-08-26 NOTE — Telephone Encounter (Signed)
Revealed negative CHEK2 mutation.  Discussed chances of CHEK2-related cancers are closer to that of hte general population.  Results of pan-cancer panel are pending.

## 2022-09-09 ENCOUNTER — Telehealth: Payer: Self-pay | Admitting: Genetic Counselor

## 2022-09-09 ENCOUNTER — Ambulatory Visit: Payer: Self-pay | Admitting: Genetic Counselor

## 2022-09-09 DIAGNOSIS — Z803 Family history of malignant neoplasm of breast: Secondary | ICD-10-CM

## 2022-09-09 DIAGNOSIS — Z8042 Family history of malignant neoplasm of prostate: Secondary | ICD-10-CM

## 2022-09-09 DIAGNOSIS — Z8041 Family history of malignant neoplasm of ovary: Secondary | ICD-10-CM

## 2022-09-09 DIAGNOSIS — Z1379 Encounter for other screening for genetic and chromosomal anomalies: Secondary | ICD-10-CM

## 2022-09-09 DIAGNOSIS — Z8481 Family history of carrier of genetic disease: Secondary | ICD-10-CM

## 2022-09-09 DIAGNOSIS — Z8052 Family history of malignant neoplasm of bladder: Secondary | ICD-10-CM

## 2022-09-09 NOTE — Telephone Encounter (Signed)
Discussed negative genetic testing results and VUS in PMS2.

## 2022-09-10 ENCOUNTER — Ambulatory Visit
Admission: EM | Admit: 2022-09-10 | Discharge: 2022-09-10 | Disposition: A | Discharge status: Home / Self Care | Payer: BC Managed Care – PPO | Attending: Family Medicine | Admitting: Family Medicine

## 2022-09-10 DIAGNOSIS — J4541 Moderate persistent asthma with (acute) exacerbation: Secondary | ICD-10-CM | POA: Diagnosis not present

## 2022-09-10 DIAGNOSIS — J3089 Other allergic rhinitis: Secondary | ICD-10-CM

## 2022-09-10 MED ORDER — METHYLPREDNISOLONE SODIUM SUCC 125 MG IJ SOLR
80.0000 mg | Freq: Once | INTRAMUSCULAR | Status: AC
  Administered 2022-09-10: 80 mg via INTRAMUSCULAR

## 2022-09-10 MED ORDER — PROMETHAZINE-DM 6.25-15 MG/5ML PO SYRP: 5.0000 mL | ORAL_SOLUTION | Freq: Four times a day (QID) | ORAL | 0 refills | Status: DC | PRN

## 2022-09-10 NOTE — ED Provider Notes (Signed)
[facility]    CSN: [id number] Arrival date & time: 09/10/22  1156      History   Chief Complaint Chief Complaint  Patient presents with   Cough    Asthma-sinus, cough - Entered by patient    HPI Nancy Robertson is a 56 y.o. female.   Patient presenting today with 1 day history of sinus drainage, sinus pressure, sinus headache, scratchy throat, chest tightness, hacking dry cough.  Denies fever, chills, body aches, chest pain, shortness of breath.  States this feels very similar to past asthma exacerbations.  No known new exposures but states the weather changes are always very hard on her.  Still taking her allergy regimen, Symbicort, albuterol as needed.    Past Medical History:  Diagnosis Date   Allergy    Asthma    seasonal only   Chest pain 04/17/2012   Dyspepsia 03/27/2019   Family history of breast cancer 08/19/2022   Family history of ovarian cancer 08/19/2022   GERD (gastroesophageal reflux disease)    Mixed hyperlipidemia 04/17/2012   Palpitations 04/17/2012   TMJ (dislocation of temporomandibular joint) 04/17/2012    Patient Active Problem List   Diagnosis Date Noted   Family history of CHEK2 gene mutation 08/19/2022   Family history of breast cancer 08/19/2022   Family history of ovarian cancer 08/19/2022   Sleep apnea 49/44/9675   Lichen sclerosus et atrophicus 04/29/2022   Seasonal allergic conjunctivitis 03/25/2022   Seasonal and perennial allergic rhinitis 09/13/2021   Penicillin allergy 09/13/2021   Food intolerance 91/63/8466   Systolic murmur 59/93/5701   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   Irritable bowel syndrome with constipation 07/03/2021   Gastroesophageal reflux disease 07/02/2021   Anxiety and depression 07/02/2021   Moderate persistent asthma, uncomplicated 77/93/9030   Dyspepsia 03/27/2019   Hypertriglyceridemia 01/21/2014   Morbid obesity ([place]) 07/31/2013   Renal cyst, left 07/15/2013   Chronic low back pain 04/29/2013    Ischemic colitis ([place]) 12/11/2012   Chest pain 04/17/2012   Mixed hyperlipidemia 04/17/2012   Palpitations 04/17/2012    Past Surgical History:  Procedure Laterality Date   BLADDER REPAIR  2010   BREAST BIOPSY Right    2020   COLONOSCOPY N/A 07/07/2016   Procedure: COLONOSCOPY;  Surgeon: Rogene Houston, MD;  Location[facility];  Service: Endoscopy;  Laterality: N/A;  1030    CYSTOSCOPY  11/10/2011   Procedure: CYSTOSCOPY;  Surgeon: Delice Lesch, MD;  Location: [facility];  Service: Gynecology;  Laterality: N/A;   KNEE SURGERY  2007   right   SALPINGOOPHORECTOMY  11/10/2011   Procedure: SALPINGO OOPHERECTOMY;  Surgeon: Delice Lesch, MD;  Location: [facility];  Service: Gynecology;  Laterality: Bilateral;   VAGINAL HYSTERECTOMY  11/10/2011   Procedure: HYSTERECTOMY VAGINAL;  Surgeon: Delice Lesch, MD;  Location: [facility];  Service: Gynecology;  Laterality: N/A;  Total Vaginal Hysterectomy, bilateral salpingo-oopherectomy, cystoscopy    OB History     Gravida  2   Para  2   Term      Preterm      AB      Living         SAB      IAB      Ectopic      Multiple      Live Births               Home Medications    Prior to Admission medications   Medication Sig  Start Date End Date Taking? Authorizing Provider  promethazine-dextromethorphan (PROMETHAZINE-DM) 6.25-15 MG/5ML syrup Take 5 mLs by mouth 4 (four) times daily as needed. 09/10/22  Yes Volney American, PA-C  albuterol (PROVENTIL) (2.5 MG/3ML) 0.083% nebulizer solution Take 3 mLs (2.5 mg total) by nebulization every 6 (six) hours as needed for wheezing or shortness of breath. 05/24/22   Leath-Warren, Alda Lea, NP  amoxicillin-clavulanate (AUGMENTIN) 875-125 MG tablet Take 1 tablet by mouth 2 (two) times daily. 05/27/22   Kathyrn Drown, MD  budesonide-formoterol [facility]) 160-4.5 MCG/ACT inhaler 2 puff bid 05/27/22   Kathyrn Drown, MD  cetirizine (ZYRTEC) 10 MG chewable tablet Chew 10 mg by  mouth daily.    [provider]  clobetasol cream (TEMOVATE) 0.05 % Apply a small amount to affected area 2-3 times per week prn 04/30/22   Nilda Simmer, NP  linaclotide [facility]) 145 MCG CAPS capsule Take 1 capsule (145 mcg total) by mouth daily before breakfast. 08/12/22   Nilda Simmer, NP  Olopatadine HCl (PAZEO) 0.7 % SOLN Apply 1 drop to eye daily as needed. 09/13/21   Valentina Shaggy, MD  Olopatadine-Mometasone [facility]) [phone] MCG/ACT SUSP Place 2 sprays into the nose in the morning and at bedtime. 05/20/22   Valentina Shaggy, MD  pantoprazole (PROTONIX) 40 MG tablet Take 1 tablet (40 mg total) by mouth daily. 06/09/22   Kathyrn Drown, MD  predniSONE (DELTASONE) 50 MG tablet Take 1 tablet daily with breakfast for 5 days. 05/24/22   Leath-Warren, Alda Lea, NP  rosuvastatin (CRESTOR) 10 MG tablet TAKE 1 TABLET DAILY FOR CHOLESTEROL 05/30/22   Kathyrn Drown, MD  sertraline (ZOLOFT) 50 MG tablet TAKE 1 TABLET DAILY 05/30/22   Kathyrn Drown, MD  sucralfate (CARAFATE) 1 GM/10ML suspension SHAKE LIQUID AND TAKE 10 ML(1 GRAM) BY MOUTH FOUR TIMES DAILY AT BEDTIME WITH MEALS AS NEEDED FOR ACID REFLUX 08/21/21   Nilda Simmer, NP    Family History Family History  Problem Relation Age of Onset   Breast cancer Mother        two primaries; dx 40 and 58; CHEK2 mutation   Hypertension Father    Diabetes Father    Hyperlipidemia Father    Lung cancer Father    COPD Father    Stroke Father    Colon polyps Father    Ovarian cancer Maternal Aunt 32   Bladder Cancer Paternal Aunt        father's pat half sibling   Cancer Paternal Aunt        unknown primary/ father's mat half sibling   Bladder Cancer Paternal Uncle    Prostate cancer Paternal Uncle    Cancer Other        PGF's siblings w/ breast, stomach, and lung cancers   Lymphoma Other        MGM's sister   Cancer Cousin        paternal cousins w/ prostate, throat, skin, lung cancers   Colon cancer Neg  Hx    Esophageal cancer Neg Hx    Liver disease Neg Hx    Pancreatic cancer Neg Hx     Social History Social History   Tobacco Use   Smoking status: Never   Smokeless tobacco: Never  Vaping Use   Vaping Use: Never used  Substance Use Topics   Alcohol use: Yes    Comment: occasionally wine    Drug use: No     Allergies   Azithromycin  and Erythromycin   Review of Systems Review of Systems Per HPI  Physical Exam Triage Vital Signs ED Triage Vitals  Enc Vitals Group     BP 09/10/22 1326 117/67     Pulse Rate 09/10/22 1326 63     Resp 09/10/22 1326 20     Temp 09/10/22 1326 98.5 F (36.9 C)     Temp Source 09/10/22 1326 Oral     SpO2 09/10/22 1326 97 %     Weight --      Height --      Head Circumference --      Peak Flow --      Pain Score 09/10/22 1330 0     Pain Loc --      Pain Edu? --      Excl. in [place]? --    No data found.  Updated Vital Signs BP 117/67 (BP Location: Right Arm)   Pulse 63   Temp 98.5 F (36.9 C) (Oral)   Resp 20   LMP 11/04/2011   SpO2 97%   Visual Acuity Right Eye Distance:   Left Eye Distance:   Bilateral Distance:    Right Eye Near:   Left Eye Near:    Bilateral Near:     Physical Exam Vitals and nursing note reviewed.  Constitutional:      Appearance: Normal appearance.  HENT:     Head: Atraumatic.     Right Ear: Tympanic membrane and external ear normal.     Left Ear: Tympanic membrane and external ear normal.     Nose: Rhinorrhea present.     Mouth/Throat:     Mouth: Mucous membranes are moist.     Pharynx: Posterior oropharyngeal erythema present.  Eyes:     Extraocular Movements: Extraocular movements intact.     Conjunctiva/sclera: Conjunctivae normal.  Cardiovascular:     Rate and Rhythm: Normal rate and regular rhythm.     Heart sounds: Normal heart sounds.  Pulmonary:     Effort: Pulmonary effort is normal.     Breath sounds: Wheezing present. No rales.     Comments: Trace wheezes  bilaterally Musculoskeletal:        General: Normal range of motion.     Cervical back: Normal range of motion and neck supple.  Skin:    General: Skin is warm and dry.  Neurological:     Mental Status: She is alert and oriented to person, place, and time.  Psychiatric:        Mood and Affect: Mood normal.        Thought Content: Thought content normal.      UC Treatments / Results  Labs (all labs ordered are listed, but only abnormal results are displayed) Labs Reviewed - No data to display  EKG   Radiology No results found.  Procedures Procedures (including critical care time)  Medications Ordered in UC Medications  methylPREDNISolone sodium succinate (SOLU-MEDROL) 125 mg/2 mL injection 80 mg (has no administration in time range)    Initial Impression / Assessment and Plan / UC Course  I have reviewed the triage vital signs and the nursing notes.  Pertinent labs & imaging results that were available during my care of the patient were reviewed by me and considered in my medical decision making (see chart for details).     Suspect allergy asthma exacerbation, declines viral testing today, treat with IM Solu-Medrol, Phenergan DM, allergy and asthma regimen.  Return for worsening symptoms.  Final Clinical  Impressions(s) / UC Diagnoses   Final diagnoses:  Seasonal allergic rhinitis due to other allergic trigger  Moderate persistent asthma with acute exacerbation   Discharge Instructions   None    ED Prescriptions     Medication Sig Dispense Auth. Provider   promethazine-dextromethorphan (PROMETHAZINE-DM) 6.25-15 MG/5ML syrup Take 5 mLs by mouth 4 (four) times daily as needed. 100 mL Volney American, [place]      PDMP not reviewed this encounter.   Volney American, [place] 09/10/22 1416

## 2022-09-10 NOTE — ED Triage Notes (Signed)
Pt reports her asthma has flared up, sinus started yesterday. She has some coughing and her throat is feeling raw. Took zyrtec,simbocort, otc cough meds but no relief.

## 2022-09-11 ENCOUNTER — Ambulatory Visit: Payer: Self-pay

## 2022-09-13 ENCOUNTER — Ambulatory Visit (INDEPENDENT_AMBULATORY_CARE_PROVIDER_SITE_OTHER): Payer: BC Managed Care – PPO | Admitting: Family Medicine

## 2022-09-13 VITALS — BP 128/82 | HR 92 | Temp 97.9°F | Ht 67.0 in | Wt 214.0 lb

## 2022-09-13 DIAGNOSIS — J019 Acute sinusitis, unspecified: Secondary | ICD-10-CM

## 2022-09-13 DIAGNOSIS — J4521 Mild intermittent asthma with (acute) exacerbation: Secondary | ICD-10-CM | POA: Diagnosis not present

## 2022-09-13 DIAGNOSIS — R6889 Other general symptoms and signs: Secondary | ICD-10-CM | POA: Diagnosis not present

## 2022-09-13 MED ORDER — PREDNISONE 20 MG PO TABS: ORAL_TABLET | ORAL | 0 refills | Status: DC

## 2022-09-13 MED ORDER — AMOXICILLIN-POT CLAVULANATE 875-125 MG PO TABS: 1.0000 | ORAL_TABLET | Freq: Two times a day (BID) | ORAL | 0 refills | Status: DC

## 2022-09-13 MED ORDER — OSELTAMIVIR PHOSPHATE 75 MG PO CAPS: 75.0000 mg | ORAL_CAPSULE | Freq: Two times a day (BID) | ORAL | 0 refills | Status: DC

## 2022-09-13 NOTE — Progress Notes (Signed)
   Subjective:    Patient ID: Nancy Robertson, female    DOB: [birth year], 56 y.o.   MRN: [id number]  HPI  Patient arrives for a follow up from recent urgent care visit for illness. Patient states she is now feeling worse and running fever Patient started off last week with a little bit of upper respiratory illness she states she is actually doing a little better for a day and a half but then after she went to the urgent care she suddenly feel worse with body aches fever head congestion drainage coughing energy level subpar and poor Review of Systems     Objective:   Physical Exam Gen-NAD not toxic TMS-normal bilateral T- normal no redness Chest-bilateral wheezy cough noted but no rhonchi respiratory rate normal no crackles CV RRR no murmur Skin-warm dry Neuro-grossly normal        Assessment & Plan:  Cannot rule out the possibility of the flu After shared discussion we will go with Tamiflu twice daily Need to cover for the possibility of sinus infection because of sinus pressure and pain antibiotics prescribed Asthma flareup Short course of prednisone Triple swab Await the results of this Stay away from others Rest that Warning signs discussed follow-up if problems

## 2022-09-14 ENCOUNTER — Encounter: Payer: Self-pay | Admitting: Family Medicine

## 2022-09-14 NOTE — Telephone Encounter (Signed)
[place] office Please go ahead with sending a work note for all of this week for Same[place] Thanks-Dr. Nicki Reaper

## 2022-09-15 ENCOUNTER — Encounter: Payer: Self-pay | Admitting: Family Medicine

## 2022-09-15 LAB — SPECIMEN STATUS REPORT

## 2022-09-15 LAB — COVID-19, FLU A+B AND RSV
Influenza A, NAA: NOT DETECTED
Influenza B, NAA: NOT DETECTED
RSV, NAA: DETECTED — AB
SARS-CoV-2, NAA: NOT DETECTED

## 2022-09-16 ENCOUNTER — Ambulatory Visit (HOSPITAL_COMMUNITY)
Admission: RE | Admit: 2022-09-16 | Discharge: 2022-09-16 | Disposition: A | Discharge status: Home / Self Care | Payer: BC Managed Care – PPO | Source: Ambulatory Visit | Attending: Family Medicine | Admitting: Family Medicine

## 2022-09-16 ENCOUNTER — Encounter: Payer: Self-pay | Admitting: Family Medicine

## 2022-09-16 ENCOUNTER — Ambulatory Visit (INDEPENDENT_AMBULATORY_CARE_PROVIDER_SITE_OTHER): Payer: BC Managed Care – PPO | Admitting: Family Medicine

## 2022-09-16 VITALS — BP 116/67 | HR 72 | Temp 98.5°F | Ht 67.0 in | Wt 212.0 lb

## 2022-09-16 DIAGNOSIS — B338 Other specified viral diseases: Secondary | ICD-10-CM

## 2022-09-16 DIAGNOSIS — B974 Respiratory syncytial virus as the cause of diseases classified elsewhere: Secondary | ICD-10-CM | POA: Diagnosis not present

## 2022-09-16 NOTE — Progress Notes (Signed)
   Subjective:    Patient ID: Nancy Robertson, female    DOB: Feb 28, 1966, 56 y.o.   MRN: [id number]  HPI Current symptoms cough congestion wheeze, crackle  Most non productive cough,w occasional mucous plugs Tired not sleeping due to cough Stopped tamiflu today, cont. Predinose and antibiotics Saturation was 91% at 1 point currently in the mid 90s  Review of Systems     Objective:   Physical Exam Gen-NAD not toxic  T- normal no redness Chest-not tachypneic Has scattered wheezing throughout Rales rhonchi in the bases  CV RRR no murmur Skin-warm dry Neuro-grossly normal   Work excuse through next Tuesday     Assessment & Plan:  Finishing up antibiotics for acute rhinosinusitis RSV positive Concerning for the possibility of developing pneumonia Continue Symbicort Albuterol only as needed Patient not using nebulizer

## 2022-09-21 ENCOUNTER — Encounter: Payer: Self-pay | Admitting: Genetic Counselor

## 2022-09-21 DIAGNOSIS — Z1379 Encounter for other screening for genetic and chromosomal anomalies: Secondary | ICD-10-CM | POA: Insufficient documentation

## 2022-09-21 HISTORY — DX: Encounter for other screening for genetic and chromosomal anomalies: Z13.79

## 2022-09-21 NOTE — Progress Notes (Signed)
HPI:   Nancy Robertson was previously seen in the [facility] due to a family history of cancer and a maternal family history of a known CHEK2 mutation. Please refer to our prior cancer genetics clinic note for more information regarding our discussion, assessment and recommendations, at the time. Nancy Robertson recent genetic test results were disclosed to her, as were recommendations warranted by these results. These results and recommendations are discussed in more detail below.  CANCER HISTORY:  Nancy Robertson is a 56 y.o. female with no personal history of cancer.     FAMILY HISTORY:  We obtained a detailed, 4-generation family history.  Significant diagnoses are listed below:      Family History  Problem Relation Age of Onset   Breast cancer Mother          two primaries; dx 47 and 8; CHEK2 mutation   Lung cancer Father     Ovarian cancer Maternal Aunt 40   Bladder Cancer Paternal Aunt          father's pat half sibling   Cancer Paternal Aunt          unknown primary/ father's mat half sibling   Bladder Cancer Paternal Uncle     Prostate cancer Paternal Uncle     Cancer Other          PGF's siblings w/ breast, stomach, and lung cancers   Lymphoma Other          MGM's sister   Cancer Cousin          paternal cousins w/ prostate, throat, skin, lung cancers       Nancy Robertson's mother had genetic testing which showed a mutation in CHEK2 called c.1263del (p.Ser422Valfs*15).  The pathogenic variant in RAD51C, which was previously detected in Nancy Robertson's maternal aunt, was not detected in her mother's sample. No other pathogenic variants detected in [facility] Multi-Cancer +RNA Panel.  The Multi-Cancer + RNA Panel offered by [facility] includes sequencing and/or deletion/duplication analysis of the following 84 genes:  AIP*, ALK, APC*, ATM*, AXIN2*, BAP1*, BARD1*, BLM*, BMPR1A*, BRCA1*, BRCA2*, BRIP1*, CASR, CDC73*, CDH1*, CDK4, CDKN1B*, CDKN1C*, CDKN2A, CEBPA, CHEK2*,  CTNNA1*, DICER1*, DIS3L2*, EGFR, EPCAM, FH*, FLCN*, GATA2*, GPC3, GREM1, HOXB13, HRAS, KIT, MAX*, MEN1*, MET, MITF, MLH1*, MSH2*, MSH3*, MSH6*, MUTYH*, NBN*, NF1*, NF2*, NTHL1*, PALB2*, PDGFRA, PHOX2B, PMS2*, POLD1*, POLE*, POT1*, PRKAR1A*, PTCH1*, PTEN*, RAD50*, RAD51C*, RAD51D*, RB1*, RECQL4, RET, RUNX1*, SDHA*, SDHAF2*, SDHB*, SDHC*, SDHD*, SMAD4*, SMARCA4*, SMARCB1*, SMARCE1*, STK11*, SUFU*, TERC, TERT, TMEM127*, Tp53*, TSC1*, TSC2*, VHL*, WRN*, and WT1.  RNA analysis is performed for * genes.   Nancy Robertson is unaware of other previous family history of genetic testing for hereditary cancer risks.  There is no reported Ashkenazi Jewish ancestry. There is no known consanguinity.  GENETIC TEST RESULTS:  The [facility] Multi-Cancer +RNA Panel found no pathogenic mutations.   The Multi-Cancer + RNA Panel offered by [facility] includes sequencing and/or deletion/duplication analysis of the following 70 genes:  AIP*, ALK, APC*, ATM*, AXIN2*, BAP1*, BARD1*, BLM*, BMPR1A*, BRCA1*, BRCA2*, BRIP1*, CDC73*, CDH1*, CDK4, CDKN1B*, CDKN2A, CHEK2*, CTNNA1*, DICER1*, EPCAM (del/dup only), EGFR, FH*, FLCN*, GREM1 (promoter dup only), HOXB13, KIT, LZTR1, MAX*, MBD4, MEN1*, MET, MITF, MLH1*, MSH2*, MSH3*, MSH6*, MUTYH*, NF1*, NF2*, NTHL1*, PALB2*, PDGFRA, PMS2*, POLD1*, POLE*, POT1*, PRKAR1A*, PTCH1*, PTEN*, RAD51C*, RAD51D*, RB1*, RET, SDHA* (sequencing only), SDHAF2*, SDHB*, SDHC*, SDHD*, SMAD4*, SMARCA4*, SMARCB1*, SMARCE1*, STK11*, SUFU*, TMEM127*, TP53*, TSC1*, TSC2*, VHL*. RNA analysis is performed for * genes.  The test report has  been scanned into EPIC and is located under the Molecular Pathology section of the Results Review tab.  A portion of the result report is included below for reference. Genetic testing reported out on September 08, 2022.     We recommended Nancy Robertson pursue testing for the familial hereditary cancer gene mutation called CHEK2 c.1263del (p.Ser422Valfs*15). Nancy Robertson's test did not reveal  the familial mutation. We call this result a true negative result because the cancer-causing mutation was identified in Nancy Robertson's family, and she did not inherit it.  Given this negative result, Nancy Robertson's chances of developing CHEK2-related cancers are closer to that of the general population.    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  At this time, it is unknown if this variant is associated with an increased risk for cancer or if it is benign, but most uncertain variants are reclassified to benign. It should not be used to make medical management decisions. With time, we suspect the laboratory will determine the significance of this variant, if any. If the laboratory reclassifies this variant, we will attempt to contact Ms. Kenan to discuss it further.   Of note, this result does not explain the paternal family history of cancer. Even though a pathogenic variant was not identified, possible explanations for the cancer in the paternal family may include: There may be no hereditary risk for cancer in the family. The cancers in Nancy Robertson's paternal family may be sporadic/familial or due to other genetic and environmental factors. There may be a gene mutation in one of these genes that current testing methods cannot detect but that chance is small. There could be another gene that has not yet been discovered, or that we have not yet tested, that is responsible for the cancer diagnoses in the paternal family.  It is also possible there is a hereditary cause for the cancer in the paternal family that Nancy Robertson did not inherit.  Therefore, it is important to remain in touch with cancer genetics in the future so that we can continue to offer Nancy Robertson the most up to date genetic testing.    ADDITIONAL GENETIC TESTING:  We discussed with Nancy Robertson that her genetic testing was fairly extensive.  If there are  additional relevant genes identified to increase cancer risk that can be analyzed in the future, we would be happy to discuss and coordinate this testing at that time.    CANCER SCREENING RECOMMENDATIONS:  Nancy Robertson test result is considered negative (normal).  The family CHEK2 mutation was not detected in her sample.  Thus, CHEK2-related high risk screening is not indicated for Nancy Robertson. The negative result also means that we have not identified a hereditary cause for her paternal family history of cancer.   An individual's cancer risk and medical management are not determined by genetic test results alone. Overall cancer risk assessment incorporates additional factors, including personal medical history, family history, and any available genetic information that may result in a personalized plan for cancer prevention and surveillance. Therefore, it is recommended she continue to follow the cancer management and screening guidelines provided by her primary healthcare provider.  RECOMMENDATIONS FOR FAMILY MEMBERS:   Since she did not inherit a identifiable mutation in a cancer predisposition gene included on this panel, her children could not have inherited a known mutation from her in one of these genes. Maternal relatives should have genetic counseling/testing as appropriate based on the  maternal family history of both CHEK2 and RAD51C mutations. Based on the paternal family history, we recommend her cousin, Audry Pili, who was diagnosed with prostate cancer, have genetic counseling and testing. Ms. Schreier can let us know if we can be of any assistance in coordinating genetic counseling and/or testing for any family members.  We do not recommend familial testing for the PMS2 variant of uncertain significance (VUS).  FOLLOW-UP:  Lastly, we discussed with Ms. Kasparek that cancer genetics is a rapidly advancing field and it is possible that new genetic tests will be appropriate for her and/or her  family members in the future. We encouraged her to remain in contact with cancer genetics on an annual basis so we can update her personal and family histories and let her know of advances in cancer genetics that may benefit this family.   Our contact number was provided. Ms. Rickles questions were answered to her satisfaction, and she knows she is welcome to call us at anytime with additional questions or concerns.   Pearley Millington M. Joette Catching, [place], [facility] [address] Genetic Counselor [email or web address] (P) [phone]

## 2022-10-25 ENCOUNTER — Encounter: Payer: Self-pay | Admitting: Nurse Practitioner

## 2022-10-31 ENCOUNTER — Telehealth: Payer: Self-pay

## 2022-10-31 ENCOUNTER — Ambulatory Visit: Payer: BC Managed Care – PPO | Admitting: Family Medicine

## 2022-10-31 ENCOUNTER — Other Ambulatory Visit: Payer: Self-pay

## 2022-10-31 DIAGNOSIS — E782 Mixed hyperlipidemia: Secondary | ICD-10-CM

## 2022-10-31 DIAGNOSIS — K760 Fatty (change of) liver, not elsewhere classified: Secondary | ICD-10-CM

## 2022-10-31 DIAGNOSIS — E785 Hyperlipidemia, unspecified: Secondary | ICD-10-CM

## 2022-10-31 DIAGNOSIS — Z789 Other specified health status: Secondary | ICD-10-CM

## 2022-10-31 DIAGNOSIS — E162 Hypoglycemia, unspecified: Secondary | ICD-10-CM

## 2022-10-31 NOTE — Telephone Encounter (Signed)
Patient notified and advised her labs have been ordered.

## 2022-10-31 NOTE — Telephone Encounter (Signed)
Done

## 2022-10-31 NOTE — Telephone Encounter (Signed)
Lipid, liver, glucose Hyperlipidemia, screening fasting sugar

## 2022-10-31 NOTE — Telephone Encounter (Signed)
Patient has an appointment for 11/04/2022 this Friday to see Hoyle Sauer and she needs lab orders. For a Random Glucose, Liver, and Lipid. Please advise.

## 2022-11-01 DIAGNOSIS — E162 Hypoglycemia, unspecified: Secondary | ICD-10-CM | POA: Diagnosis not present

## 2022-11-01 DIAGNOSIS — E785 Hyperlipidemia, unspecified: Secondary | ICD-10-CM | POA: Diagnosis not present

## 2022-11-01 DIAGNOSIS — K760 Fatty (change of) liver, not elsewhere classified: Secondary | ICD-10-CM | POA: Diagnosis not present

## 2022-11-02 LAB — LIPID PANEL
Chol/HDL Ratio: 4.3 ratio (ref 0.0–4.4)
Cholesterol, Total: 197 mg/dL (ref 100–199)
HDL: 46 mg/dL (ref >39)
LDL Chol Calc (NIH): 123 mg/dL — ABNORMAL HIGH (ref 0–99)
Triglycerides: 155 mg/dL — ABNORMAL HIGH (ref 0–149)
VLDL Cholesterol Cal: 28 mg/dL (ref 5–40)

## 2022-11-02 LAB — HEPATIC FUNCTION PANEL
ALT: 26 IU/L (ref 0–32)
AST: 19 IU/L (ref 0–40)
Albumin: 4.7 g/dL (ref 3.8–4.9)
Alkaline Phosphatase: 71 IU/L (ref 44–121)
Bilirubin Total: 0.4 mg/dL (ref 0.0–1.2)
Bilirubin, Direct: 0.1 mg/dL (ref 0.00–0.40)
Total Protein: 6.7 g/dL (ref 6.0–8.5)

## 2022-11-02 LAB — GLUCOSE, RANDOM: Glucose: 94 mg/dL (ref 70–99)

## 2022-11-04 ENCOUNTER — Ambulatory Visit (INDEPENDENT_AMBULATORY_CARE_PROVIDER_SITE_OTHER): Payer: BC Managed Care – PPO | Admitting: Nurse Practitioner

## 2022-11-04 VITALS — BP 108/64 | HR 78 | Temp 98.1°F | Ht 67.0 in | Wt 219.0 lb

## 2022-11-04 DIAGNOSIS — E782 Mixed hyperlipidemia: Secondary | ICD-10-CM

## 2022-11-04 DIAGNOSIS — R5383 Other fatigue: Secondary | ICD-10-CM

## 2022-11-04 MED ORDER — PHENTERMINE HCL 37.5 MG PO TABS: 37.5000 mg | ORAL_TABLET | Freq: Every day | ORAL | 0 refills | Status: DC

## 2022-11-04 NOTE — Progress Notes (Unsigned)
Subjective:    Patient ID: Nancy Robertson, female    DOB: Feb 17, 1966, 57 y.o.   MRN: [id number]  HPI Follow up for hyperlipidemia and chronic medical conditions Labs recently done Has been following a program through [facility] to help with weight loss but has been struggling lately.  The lowest she has gotten is 194 pounds.  Currently weighs around 216-217 pounds.  Her main concern is the weight gain around her waist area.  Has taken some medications in the past.  Could not take Contrave due to side effects.  Does not remember taking phentermine.  No regular exercise or activity.  Currently on rosuvastatin 10 mg daily.  Is also here today to discuss her recent lab work.  Has been experiencing more fatigue but patient also is under increased stress lately.  Her mother was recently diagnosed with breast cancer.  Is no longer taking her sertraline.    11/04/2022   10:27 AM  Depression screen PHQ 2/9  Decreased Interest 2  Down, Depressed, Hopeless 2  PHQ - 2 Score 4  Altered sleeping 1  Tired, decreased energy 1  Change in appetite 3  Feeling bad or failure about yourself  2  Trouble concentrating 1  Moving slowly or fidgety/restless 2  Suicidal thoughts 0  PHQ-9 Score 14  Difficult doing work/chores Somewhat difficult      11/04/2022   10:28 AM 07/02/2021    2:40 PM 04/07/2021    4:15 PM 08/21/2020   12:25 PM  GAD 7 : Generalized Anxiety Score  Nervous, Anxious, on Edge '3 2 2 3  '$ Control/stop worrying '2 2 2 2  '$ Worry too much - different things '2 1 2 1  '$ Trouble relaxing '1 1 1 2  '$ Restless '1 2 2 1  '$ Easily annoyed or irritable '3 1 2 1  '$ Afraid - awful might happen '1 1 2 2  '$ Total GAD 7 Score '13 10 13 12  '$ Anxiety Difficulty Somewhat difficult Not difficult at all Not difficult at all Not difficult at all    Review of Systems  Constitutional:  Positive for fatigue.  HENT:  Negative for sore throat and trouble swallowing.   Respiratory:  Negative for chest tightness and shortness of  breath.   Cardiovascular:  Negative for chest pain and palpitations.  Psychiatric/Behavioral:  Positive for dysphoric mood. Negative for suicidal ideas. The patient is nervous/anxious.        Objective:   Physical Exam NAD.  Alert, oriented.  Mildly anxious affect.  Tearful at times especially when discussing her mother's condition.  Making good eye contact.  Speech clear.  Thoughts logical coherent and relevant.  Dressed appropriately for the weather.  Thyroid nontender to palpation, no mass or goiter noted.  Lungs clear.  Heart regular rate rhythm.  Central obesity noted. Today's Vitals   11/04/22 1020  BP: 108/64  Pulse: 78  Temp: 98.1 F (36.7 C)  SpO2: 96%  Weight: 219 lb (99.3 kg)  Height: '5\' 7"'$  (1.702 m)   Body mass index is 34.3 kg/m.  Lipid panel     Status: Abnormal   Collection Time: 11/01/22  8:43 AM  Result Value Ref Range   Cholesterol, Total 197 100 - 199 mg/dL   Triglycerides 155 (H) 0 - 149 mg/dL   HDL 46 >39 mg/dL   VLDL Cholesterol Cal 28 5 - 40 mg/dL   LDL Chol Calc (NIH) 123 (H) 0 - 99 mg/dL   Chol/HDL Ratio 4.3 0.0 - 4.4 ratio  Comment:                                   T. Chol/HDL Ratio                                             Men  Women                               1/2 Avg.Risk  3.4    3.3                                   Avg.Risk  5.0    4.4                                2X Avg.Risk  9.6    7.1                                3X Avg.Risk 23.4   11.0   Hepatic function panel     Status: None   Collection Time: 11/01/22  8:43 AM  Result Value Ref Range   Total Protein 6.7 6.0 - 8.5 g/dL   Albumin 4.7 3.8 - 4.9 g/dL   Bilirubin Total 0.4 0.0 - 1.2 mg/dL   Bilirubin, Direct 0.10 0.00 - 0.40 mg/dL   Alkaline Phosphatase 71 44 - 121 IU/L   AST 19 0 - 40 IU/L   ALT 26 0 - 32 IU/L  Glucose, random     Status: None   Collection Time: 11/01/22  8:43 AM   Reviewed labs with patient.     Assessment & Plan:   Problem List Items Addressed This  Visit       Other   Mixed hyperlipidemia - Primary   Morbid obesity ([place])   Relevant Medications   phentermine (ADIPEX-P) 37.5 MG tablet   Other Visit Diagnoses     Fatigue, unspecified type       Relevant Orders   TSH (Completed)      Meds ordered this encounter  Medications   phentermine (ADIPEX-P) 37.5 MG tablet    Sig: Take 1 tablet (37.5 mg total) by mouth daily before breakfast.    Dispense:  30 tablet    Refill:  0    Order Specific Question:   Supervising Provider    Answer:   Sallee Lange A [[id number]]   TSH ordered. Continue rosuvastatin as directed. Encourage patient to begin regular activity.  Continue with weight loss program. Trial of phentermine, start with half tab daily.  Reviewed potential adverse effects.  Discontinue medication and contact office if any problems. Patient defers medication for anxiety and depression at this time. Return in about 1 month (around 12/05/2022). Call back sooner if needed.

## 2022-11-04 NOTE — Patient Instructions (Signed)
Weg[facility] Saxenda Zepbound

## 2022-11-05 ENCOUNTER — Encounter: Payer: Self-pay | Admitting: Nurse Practitioner

## 2022-11-05 LAB — TSH: TSH: 1.4 u[IU]/mL (ref 0.450–4.500)

## 2022-11-22 ENCOUNTER — Encounter: Payer: Self-pay | Admitting: Family Medicine

## 2022-11-22 ENCOUNTER — Ambulatory Visit (INDEPENDENT_AMBULATORY_CARE_PROVIDER_SITE_OTHER): Payer: BC Managed Care – PPO | Admitting: Family Medicine

## 2022-11-22 ENCOUNTER — Ambulatory Visit: Payer: BC Managed Care – PPO | Admitting: Family Medicine

## 2022-11-22 VITALS — BP 103/68 | Temp 98.0°F | Wt 222.0 lb

## 2022-11-22 DIAGNOSIS — J4521 Mild intermittent asthma with (acute) exacerbation: Secondary | ICD-10-CM | POA: Diagnosis not present

## 2022-11-22 DIAGNOSIS — U071 COVID-19: Secondary | ICD-10-CM | POA: Diagnosis not present

## 2022-11-22 MED ORDER — PREDNISONE 20 MG PO TABS: 20.0000 mg | ORAL_TABLET | Freq: Every day | ORAL | 0 refills | Status: DC

## 2022-11-22 MED ORDER — METHYLPREDNISOLONE ACETATE 40 MG/ML IJ SUSP
40.0000 mg | Freq: Once | INTRAMUSCULAR | Status: AC
  Administered 2022-11-22: 40 mg via INTRAMUSCULAR

## 2022-11-22 NOTE — Progress Notes (Signed)
   Subjective:    Patient ID: Nancy Robertson, female    DOB: 1966/06/30, 57 y.o.   MRN: [id number]  HPI Patient tested positive for COVID this morning. Patient is having a asthma flare up. Patient is taking her Symbicort and Zyrtec and is still having tightness, cough non productive. Nasal congestion and fatigued.  Patient states she does not feel as bad today and she is not sure if she really needs the Paxil a bit.  She is interested in utilizing prednisone to help ward off a more severe asthma attack  Review of Systems     Objective:   Physical Exam Gen-NAD not toxic TMS-normal bilateral T- normal no redness Chest-minimal to mild tight airways noted but no respiratory distress CV RRR no murmur Skin-warm dry Neuro-grossly normal        Assessment & Plan:  COVID infection Warning signs discussed After shared discussion options were Paxlovid versus watchful waiting Patient will watch how her symptoms do and she will let us know over the next 24 to 48 hours if she is getting worse if so we will initiate Paxlovid Reactive airway-albuterol as needed Also recommend short course prednisone as well as Depo-Medrol 40 mg Warning signs discussed Albuterol as needed

## 2022-11-22 NOTE — Telephone Encounter (Signed)
Patient scheduled 3:30pm appointment with Dr Lacinda Axon

## 2022-11-23 ENCOUNTER — Other Ambulatory Visit: Payer: Self-pay | Admitting: *Deleted

## 2022-11-23 MED ORDER — BUDESONIDE-FORMOTEROL FUMARATE 160-4.5 MCG/ACT IN AERO: INHALATION_SPRAY | RESPIRATORY_TRACT | 0 refills | Status: DC

## 2022-11-28 ENCOUNTER — Other Ambulatory Visit: Payer: Self-pay | Admitting: Family Medicine

## 2022-12-09 ENCOUNTER — Encounter: Payer: Self-pay | Admitting: Nurse Practitioner

## 2022-12-09 ENCOUNTER — Ambulatory Visit (INDEPENDENT_AMBULATORY_CARE_PROVIDER_SITE_OTHER): Payer: BC Managed Care – PPO | Admitting: Nurse Practitioner

## 2022-12-09 VITALS — BP 116/77 | HR 77 | Wt 219.2 lb

## 2022-12-09 DIAGNOSIS — K219 Gastro-esophageal reflux disease without esophagitis: Secondary | ICD-10-CM | POA: Diagnosis not present

## 2022-12-09 MED ORDER — PHENTERMINE HCL 37.5 MG PO TABS: 37.5000 mg | ORAL_TABLET | Freq: Every day | ORAL | 2 refills | Status: DC

## 2022-12-09 NOTE — Progress Notes (Signed)
   Subjective:    Patient ID: Nancy Robertson, female    DOB: 06-09-1966, 57 y.o.   MRN: [id number]  HPI Patient is for follow up for medication check for phentermine [facility]. Other than dry mouth, denies any adverse effects from the phentermine.  Note the patient was on oral steroids in mid February.  Mainly cooks at home.  Last week began an exercise/activity program.  Is interested in looking at GLP-1 medications.  Denies any personal history of pancreatitis or gallbladder issues.  Denies any family history of thyroid cancer or other endocrine cancers. Taking daily pantoprazole for her reflux which is well-controlled.  Had an EGD December 2022.  Taking Linzess 145 mcg 2 capsules/day which has greatly helped her bowel symptoms.   Review of Systems  Constitutional:  Positive for appetite change.  Respiratory:  Negative for chest tightness, shortness of breath and wheezing.   Cardiovascular:  Negative for chest pain and palpitations.  Gastrointestinal:  Negative for abdominal pain, constipation, nausea and vomiting.      12/09/2022    8:29 AM  Depression screen PHQ 2/9  Decreased Interest 0  Down, Depressed, Hopeless 0  PHQ - 2 Score 0  Difficult doing work/chores Not difficult at all      12/09/2022    8:29 AM 11/04/2022   10:28 AM 07/02/2021    2:40 PM 04/07/2021    4:15 PM  GAD 7 : Generalized Anxiety Score  Nervous, Anxious, on Edge '1 3 2 2  '$ Control/stop worrying 0 '2 2 2  '$ Worry too much - different things 0 '2 1 2  '$ Trouble relaxing 0 '1 1 1  '$ Restless 0 '1 2 2  '$ Easily annoyed or irritable 0 '3 1 2  '$ Afraid - awful might happen 0 '1 1 2  '$ Total GAD 7 Score '1 13 10 13  '$ Anxiety Difficulty Not difficult at all Somewhat difficult Not difficult at all Not difficult at all         Objective:   Physical Exam NAD.  Alert, oriented.  Lungs clear.  Heart regular rate rhythm.  Abdomen soft nondistended nontender.  Has had 3 pounds of weight loss since her last visit. Today's Vitals   12/09/22  0821  BP: 116/77  Pulse: 77  SpO2: 95%  Weight: 219 lb 3.2 oz (99.4 kg)   Body mass index is 34.33 kg/m.        Assessment & Plan:  1. Gastroesophageal reflux disease without esophagitis Continue pantoprazole as directed.  2. Morbid obesity ([place]) Meds ordered this encounter  Medications   phentermine (ADIPEX-P) 37.5 MG tablet    Sig: Take 1 tablet (37.5 mg total) by mouth daily before breakfast.    Dispense:  30 tablet    Refill:  2    Order Specific Question:   Supervising Provider    Answer:   Sallee Lange A [[id number]]   For now, continue phentermine as directed.  Continue healthy lifestyle including healthy diet and increasing activity.  Patient will check on cost and coverage of GLP-1 options and get back with [place].  Reviewed potential adverse effects associated with GLP-1 medications. Recommend follow-up in 3 months if she wishes to continue the phentermine.

## 2022-12-09 NOTE — Patient Instructions (Signed)
Saxenda Wegovy Zepbound

## 2023-01-02 DIAGNOSIS — Z0289 Encounter for other administrative examinations: Secondary | ICD-10-CM

## 2023-01-04 ENCOUNTER — Encounter (INDEPENDENT_AMBULATORY_CARE_PROVIDER_SITE_OTHER): Payer: BC Managed Care – PPO | Admitting: Family Medicine

## 2023-01-11 ENCOUNTER — Encounter: Payer: Self-pay | Admitting: Nurse Practitioner

## 2023-01-12 NOTE — Telephone Encounter (Signed)
Patient would also like office visit to discuss medication options

## 2023-01-13 ENCOUNTER — Ambulatory Visit (INDEPENDENT_AMBULATORY_CARE_PROVIDER_SITE_OTHER): Payer: BC Managed Care – PPO | Admitting: Nurse Practitioner

## 2023-01-13 ENCOUNTER — Encounter: Payer: Self-pay | Admitting: Nurse Practitioner

## 2023-01-13 VITALS — BP 125/81 | HR 68 | Temp 97.9°F | Wt 223.0 lb

## 2023-01-13 DIAGNOSIS — E669 Obesity, unspecified: Secondary | ICD-10-CM

## 2023-01-13 MED ORDER — ZEPBOUND 2.5 MG/0.5ML ~~LOC~~ SOAJ: 2.5000 mg | SUBCUTANEOUS | 0 refills | Status: DC

## 2023-01-13 NOTE — Progress Notes (Unsigned)
   Subjective:    Patient ID: Nancy Robertson, female    DOB: [birth year], 57 y.o.   MRN: [id number]  HPI Discuss weight loss medications , has stopped phentermine 5 days ago. Would like to try GLP-1 medication. Denies any personal history of pancreatitis or thyroid cancer. No family history of thyroid cancer or endocrine cancers.  Difficulty losing weight especially around abdominal area. Has tried several weight loss programs. With the cost of the programs, patient would like to put the costs towards medication. Has increased her activity.    Review of Systems  Respiratory:  Negative for cough, chest tightness and shortness of breath.   Cardiovascular:  Negative for chest pain.       Objective:   Physical Exam NAD. Alert, oriented. Lungs clear. Heart RRR.  Today's Vitals   01/13/23 1616  BP: 125/81  Pulse: 68  Temp: 97.9 F (36.6 C)  SpO2: 97%  Weight: 223 lb (101.2 kg)   Body mass index is 34.93 kg/m.        Assessment & Plan:  1. Obesity (BMI 30.0-34.9) Discussed potential adverse effects of Zepbound. DC medication and contact office if any problems. Discussed activity and healthy diet.  - tirzepatide (ZEPBOUND) 2.5 MG/0.5ML Pen; Inject 2.5 mg into the skin once a week.  Dispense: 2 mL; Refill: 0 Return in about 3 months (around 04/14/2023). Call back within one month to discuss dosing. Patient understands that there is a shortage of these meds at this time.

## 2023-01-14 ENCOUNTER — Encounter: Payer: Self-pay | Admitting: Nurse Practitioner

## 2023-01-14 DIAGNOSIS — E669 Obesity, unspecified: Secondary | ICD-10-CM | POA: Insufficient documentation

## 2023-01-17 ENCOUNTER — Other Ambulatory Visit: Payer: Self-pay | Admitting: Family Medicine

## 2023-01-17 DIAGNOSIS — E669 Obesity, unspecified: Secondary | ICD-10-CM

## 2023-01-17 MED ORDER — ZEPBOUND 2.5 MG/0.5ML ~~LOC~~ SOAJ: 2.5000 mg | SUBCUTANEOUS | 0 refills | Status: DC

## 2023-01-27 ENCOUNTER — Ambulatory Visit: Payer: BC Managed Care – PPO | Admitting: Nurse Practitioner

## 2023-01-31 ENCOUNTER — Other Ambulatory Visit: Payer: Self-pay | Admitting: Family Medicine

## 2023-01-31 ENCOUNTER — Encounter: Payer: Self-pay | Admitting: Family Medicine

## 2023-01-31 DIAGNOSIS — E669 Obesity, unspecified: Secondary | ICD-10-CM

## 2023-01-31 NOTE — Telephone Encounter (Signed)
Nurses Please print off this prescription 1 month supply with 2 additional refills Please make sure diagnostic code is on the prescription I can sign this prescription and then Mychael can come by this week to pick it up  As I am sure she is aware when she starts this medication she should give [place] feedback within 3 to 4 weeks how she is doing with the medicine because the approach would be increasing the dose every 4 weeks as tolerated  You may share this message with her on MyChart thank you-Dr. Lilyan Punt

## 2023-02-01 ENCOUNTER — Ambulatory Visit (INDEPENDENT_AMBULATORY_CARE_PROVIDER_SITE_OTHER): Payer: BC Managed Care – PPO | Admitting: Family Medicine

## 2023-02-01 ENCOUNTER — Encounter (INDEPENDENT_AMBULATORY_CARE_PROVIDER_SITE_OTHER): Payer: Self-pay | Admitting: Family Medicine

## 2023-02-01 ENCOUNTER — Other Ambulatory Visit: Payer: Self-pay | Admitting: Family Medicine

## 2023-02-01 VITALS — BP 105/71 | HR 62 | Temp 98.1°F | Ht 66.0 in | Wt 213.0 lb

## 2023-02-01 DIAGNOSIS — K760 Fatty (change of) liver, not elsewhere classified: Secondary | ICD-10-CM

## 2023-02-01 DIAGNOSIS — R0602 Shortness of breath: Secondary | ICD-10-CM

## 2023-02-01 DIAGNOSIS — E88819 Insulin resistance, unspecified: Secondary | ICD-10-CM | POA: Diagnosis not present

## 2023-02-01 DIAGNOSIS — R5383 Other fatigue: Secondary | ICD-10-CM | POA: Diagnosis not present

## 2023-02-01 DIAGNOSIS — E782 Mixed hyperlipidemia: Secondary | ICD-10-CM

## 2023-02-01 DIAGNOSIS — E669 Obesity, unspecified: Secondary | ICD-10-CM | POA: Diagnosis not present

## 2023-02-01 DIAGNOSIS — E559 Vitamin D deficiency, unspecified: Secondary | ICD-10-CM | POA: Diagnosis not present

## 2023-02-01 DIAGNOSIS — Z1331 Encounter for screening for depression: Secondary | ICD-10-CM

## 2023-02-01 DIAGNOSIS — K581 Irritable bowel syndrome with constipation: Secondary | ICD-10-CM | POA: Diagnosis not present

## 2023-02-01 DIAGNOSIS — F39 Unspecified mood [affective] disorder: Secondary | ICD-10-CM

## 2023-02-01 DIAGNOSIS — Z6834 Body mass index (BMI) 34.0-34.9, adult: Secondary | ICD-10-CM

## 2023-02-01 MED ORDER — ZEPBOUND 2.5 MG/0.5ML ~~LOC~~ SOAJ: 2.5000 mg | SUBCUTANEOUS | 3 refills | Status: DC

## 2023-02-01 NOTE — Progress Notes (Signed)
Carlye Grippe, D.O.  ABFM, ABOM Specializing in Clinical Bariatric Medicine Office located at: [address] [place], [place]  [address]     Initial Bariatric Medicine Consultation Visit  Dear Nancy Sciara, MD   Thank you for referring Nancy Robertson to our clinic today for evaluation.  We performed a consultation to discuss her options for treatment and educate the patient on her disease state.  The following note includes my evaluation and treatment recommendations.   Please do not hesitate to reach out to me directly if you have any further concerns.   Assessment and Plan:   Orders Placed This Encounter  Procedures   CBC with Differential/Platelet   Comprehensive metabolic panel   Hemoglobin A1c   Insulin, random   Lipid panel   T4, free   TSH   VITAMIN D 25 Hydroxy (Vit-D Deficiency, Fractures)   Vitamin B12   EKG 12-Lead    Medications Discontinued During This Encounter  Medication Reason   clobetasol cream (TEMOVATE) 0.05 % Patient Preference   albuterol (PROVENTIL) (2.5 MG/3ML) 0.083% nebulizer solution Patient Preference   sucralfate (CARAFATE) 1 GM/10ML suspension Patient Preference     No orders of the defined types were placed in this encounter.  1) Fatigue Assessment: Condition is Not optimized. Nancy Robertson does feel that her weight is causing her energy to be lower than it should be. Fatigue may be related to obesity, depression or many other causes. she does not appear to have any red flag symptoms and this appears to most likely related to her current lifestyle habits and dietary intake.  Plan:  Labs will be ordered and reviewed with her at their next office visit in two weeks. Epworth sleepiness scale score appears to be within normal limits.  Her ESS score is 4.   Nancy Robertson denies daytime somnolence and denies waking up still tired.  Nancy Robertson generally gets 7 hours of sleep per night, and states that she has generally restful sleep. Snoring is present.  Apneic episodes is not present.  ECG: Normal sinus rhythm, rate 65 bpm; reassuring without any acute abnormalities, will continue to monitor for symptoms  Modified PHQ-9 Depression Screen: Her Food and Mood (modified PHQ-9) score was 15. In the meanwhile, Nancy Robertson will focus on self care including making healthy food choices by following their meal plan, improving sleep quality and focusing on stress reduction.  Once we are assured she is on an appropriate meal plan, we will start discussing exercise to increase cardiovascular fitness levels.    2) Shortness of breath on exertion Assessment: Condition is Not optimized.. Nancy Robertson does feel that she gets out of breath more easily than she used to when she exercises and seems to be worsening over time with weight gain.  This has gotten worse recently. Nancy Robertson denies shortness of breath at rest or orthopnea. Nancy Robertson's shortness of breath appears to be obesity related and exercise induced, as they do not appear to have any "red flag" symptoms/ concerns today.  Also, this condition appears to be related to a state of poor cardiovascular conditioning   Plan:  Indirect Calorimeter completed today to help guide our dietary regimen. It shows a VO2 of 238 and a REE of 1642.  Her calculated basal metabolic rate is 6045 thus her measured basal metabolic rate is slightly worse than expected. Patient agreed to work on weight loss at this time.  As Nancy Robertson progresses through our weight loss program, we will gradually increase exercise as tolerated  to treat her current condition.   If Nancy Robertson follows our recommendations and loses 5-10% of their weight without improvement of her shortness of breath or if at any time, symptoms become more concerning, they agree to urgently follow up with their PCP/ specialist for further consideration/ evaluation.   Nancy Robertson verbalizes agreement with this plan.    Depression screening   Mixed hyperlipidemia Assessment: Condition is Not optimized..  Lab Results   Component Value Date   CHOL 197 11/01/2022   HDL 46 11/01/2022   LDLCALC 123 (H) 11/01/2022   TRIG 155 (H) 11/01/2022   CHOLHDL 4.3 11/01/2022  She reports good compliance and tolerance with Crestor 10 mg daily. Denies any side effects. She has been on this medication for approximately 2-3 years.   Plan: Check labs. Nancy Robertson agrees to continue with meds as recommended by PCP and begin our treatment plan of a heart-heathy, low cholesterol meal plan   NAFLD (nonalcoholic fatty liver disease) Assessment: Condition is stable. On 08/16/21, patient saw GI and was diagnosed with NAFLD. Diagnoses was confirmed via ultrasound.   Plan: Check labs Continue treatment as recommended by GI doctor. - The mainstay of treatment of NAFLD includes lifestyle modification to achieve weight loss, at least 7% of current body weight. Low carbohydrate diets can be beneficial in improving NAFLD liver histology. Additionally, exercise, even the absence of weight loss can have beneficial effects on the patient's metabolic profile and liver health.   Mood disorder-Emotional Eating Assessment: Condition is Not optimized.. Denies any SI/HI. Mood is stable. Patient last saw Behavioral Health in 2022 for counseling because of the loss of several close family members. Patient endorses that she eats when stressed, sad, bored, guilty, and to help comfort her self.   Plan: Check labs. Patient was referred to Dr. Dewaine Conger, our Bariatric Psychologist, for evaluation due to her  struggles with emotional eating.  Start  her prudent nutritional plan that is low in simple carbohydrates, saturated fats and trans fats to goal of 5-10% weight loss to achieve significant health benefits.  Pt encouraged to continually advance exercise and cardiovascular fitness as tolerated throughout weight loss journey.    Irritable bowel syndrome with constipation Assessment: Condition is stable. Patient sees GI for this condition. She is  compliant with Protonix 40 mg daily and Carafate 1 gm QID as recommended by PCP/specialist. She takes linzess 145 mcg daily for her constipation. She endorses that her IBS does not interfere with her ability to eat certain foods.   Plan: Continue meds and treatment as recommended by GI.  Begin her prudent nutritional plan that is low in simple carbohydrates, saturated fats and trans fats to goal of 5-10% weight loss to achieve significant health benefits.  Pt encouraged to continually advance exercise and cardiovascular fitness as tolerated throughout weight loss journey.   TREATMENT PLAN FOR OBESITY: Class 1 obesity with serious comorbidity and body mass index (BMI) of 34.0 to 34.9 in adult, unspecified obesity type Assessment: Condition is Not optimized. Fat mass is 92.4 lbs.  Muscle mass Is 114.6 lbs.  Total body water is 76.2 lbs.  - Patient is on Zepbound 2.5 mg weekly per Dr.Luking. Denies any side effects.  - She endorses that her hunger and cravings are controlled on this medication.  Plan: Check labs. Continue with med as recommended by PCP.  Nancy Robertson is currently in the action stage of change. As such, her goal is to continue weight management plan. Nancy Robertson will work on healthier eating habits  and begin the Category 2 meal plan.  I extensively reviewed the meal plan with the patient and answered all her questions.   Behavioral Intervention Additional resources provided today: category 2 meal plan information Evidence-based interventions for health behavior change were utilized today including the discussion of self monitoring techniques, problem-solving barriers and SMART goal setting techniques.   Regarding patient's less desirable eating habits and patterns, we employed the technique of small changes.  Pt will specifically work on: begin the Category 2 nutritional plan and weighing her lean proteins for next visit.    Recommended Physical Activity Goals Nancy Robertson has been advised to work up to  150 minutes of moderate intensity aerobic activity a week and strengthening exercises 2-3 times per week for cardiovascular health, weight loss maintenance and preservation of muscle mass.  She has agreed to continue their current level of activity  FOLLOW UP: Follow up in 2 weeks. She was informed of the importance of frequent follow up visits to maximize her success with intensive lifestyle modifications for her multiple health conditions. Nancy Robertson that we will review all of her lab results at our next visit.  She is Robertson that if anything is critical/ life threatening with the results, we will be contacting her via MyChart prior to the office visit to discuss management.    Chief Complaint:   OBESITY Nancy Robertson (MR# [id number]) is a 57 y.o. female who presents for evaluation and treatment of obesity and related comorbidities. Current BMI is Body mass index is 34.38 kg/m. Nancy Robertson has been struggling with her weight for many years and has been unsuccessful in either losing weight, maintaining weight loss, or reaching her healthy weight goal.  Nancy Robertson P Broadfoot is currently in the action stage of change and ready to dedicate time achieving and maintaining a healthier weight. Lutisha is interested in becoming our patient and working on intensive lifestyle modifications including (but not limited to) diet and exercise for weight loss.  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 Patient is married to Nancy Robertson  and has a 66 y/o son Nancy Robertson. She lives with her husband and son.  She reports that stress and emotional eating are the biggest reason for her weight gain.   Since 2017, she has tried various weight loss programs/diets: [facility] [employer], [place], [place], [place] [facility], and [facility].  She eats outside the home once weekly and loves to cook.   She snacks on bread, butter, and carbs and craves sugary and savory  foods.   Since 01/21/23 she has been eating higher protein-low sugar foods and/or fresh veggies for snacks.   She drinks fresh juice, coffee with milk, fruit smooth with protein shakes, and alcohol (once a week)  She endorses that her worst food habit is late night snacking on bread and butter.   Subjective:   This is the patient's first visit at [facility].  The patient's NEW PATIENT PACKET that they filled out prior to today's office visit was reviewed at length and information from that paperwork was included within the following office visit note.    Included in the packet: current and past health history, medications, allergies, ROS, gynecologic history (women only), surgical history, family history, social history, weight history, weight loss surgery history (for those that have had weight loss surgery), nutritional evaluation, mood and food questionnaire along with a depression screening (PHQ9) on all patients, an  Epworth questionnaire, sleep habits questionnaire, patient life and health improvement goals questionnaire. These will all be scanned into the patient's chart under the "media" tab.   Review of Systems: Please refer to new patient packet scanned into media. Pertinent positives were addressed with patient today.  Objective:   PHYSICAL EXAM: Blood pressure 105/71, pulse 62, temperature 98.1 F (36.7 C), height 5\' 6"  (1.676 m), weight 213 lb (96.6 kg), last menstrual period 11/04/2011, SpO2 96 %. Body mass index is 34.38 kg/m. General: Well Developed, well nourished, and in no acute distress.  HEENT: Normocephalic, atraumatic Skin: Warm and dry, cap RF less 2 sec, good turgor Chest:  Normal excursion, shape, no gross abn Respiratory: speaking in full sentences, no conversational dyspnea NeuroM-Sk: Ambulates w/o assistance, moves * 4 Psych: A and O *3, insight good, mood-full  Anthropometric Measurements Height: 5\' 6"  (1.676 m) Weight: 213 lb (96.6  kg) BMI (Calculated): 34.4 Weight at Last Visit: N/A Weight Lost Since Last Visit: N/A Weight Gained Since Last Visit: N/A Starting Weight: 213 lb Waist Measurement : 45 inches   Body Composition  Body Fat %: 43.4 % Fat Mass (lbs): 92.4 lbs Muscle Mass (lbs): 114.6 lbs Total Body Water (lbs): 76.2 lbs Visceral Fat Rating : 12   Other Clinical Data RMR: 1642 Fasting: Yes Labs: Yes Today's Visit #: 1 Starting Date: 02/01/23 Comments: Initial Visit    DIAGNOSTIC DATA REVIEWED:  BMET    Component Value Date/Time   NA 141 12/07/2021 0934   K 4.4 12/07/2021 0934   CL 102 12/07/2021 0934   CO2 24 12/07/2021 0934   GLUCOSE 94 11/01/2022 0843   GLUCOSE 94 10/26/2018 1300   BUN 13 12/07/2021 0934   CREATININE 0.80 12/07/2021 0934   CALCIUM 9.5 12/07/2021 0934   GFRNONAA 91 08/14/2020 0815   GFRAA 105 08/14/2020 0815   Lab Results  Component Value Date   HGBA1C 5.6 03/06/2014   No results found for: "INSULIN" Lab Results  Component Value Date   TSH 1.400 11/04/2022   CBC    Component Value Date/Time   WBC 6.1 10/26/2018 1300   RBC 4.40 10/26/2018 1300   HGB 13.2 07/02/2021 1605   HGB 13.1 10/26/2018 1300   HCT 40.7 10/26/2018 1300   PLT 214 10/26/2018 1300   MCV 92.5 10/26/2018 1300   MCH 29.8 10/26/2018 1300   MCHC 32.2 10/26/2018 1300   RDW 12.6 10/26/2018 1300   Iron Studies No results found for: "IRON", "TIBC", "FERRITIN", "IRONPCTSAT" Lipid Panel     Component Value Date/Time   CHOL 197 11/01/2022 0843   TRIG 155 (H) 11/01/2022 0843   HDL 46 11/01/2022 0843   CHOLHDL 4.3 11/01/2022 0843   CHOLHDL 4.2 11/29/2014 0902   VLDL 27 11/29/2014 0902   LDLCALC 123 (H) 11/01/2022 0843   Hepatic Function Panel     Component Value Date/Time   PROT 6.7 11/01/2022 0843   ALBUMIN 4.7 11/01/2022 0843   AST 19 11/01/2022 0843   ALT 26 11/01/2022 0843   ALKPHOS 71 11/01/2022 0843   BILITOT 0.4 11/01/2022 0843   BILIDIR 0.10 11/01/2022 0843   IBILI NOT  CALCULATED 10/26/2018 1300      Component Value Date/Time   TSH 1.400 11/04/2022 1133   Nutritional Lab Results  Component Value Date   VD25OH 66.9 02/14/2020    Attestation Statements:   Reviewed by clinician on day of visit: allergies, medications, problem list, medical history, surgical history, family history, social history, and previous encounter  notes.  During the visit, I independently reviewed the patient's EKG, bioimpedance scale results, and indirect calorimeter results. I used this information to tailor a meal plan for the patient that will help Marrion P Saintil to lose weight and will improve her obesity-related conditions going forward.  I performed a medically necessary appropriate examination and/or evaluation. I discussed the assessment and treatment plan with the patient. The patient was provided an opportunity to ask questions and all were answered. The patient agreed with the plan and demonstrated an understanding of the instructions. Labs were ordered today (unless patient declined them) and will be reviewed with the patient at our next visit unless more critical results need to be addressed immediately. Clinical information was updated and documented in the EMR.  Time spent on visit including pre-visit chart review and post-visit care was estimated to be 60  minutes.    I,Special Puri,acting as a Neurosurgeon for [employer], DO.,have documented all relevant documentation on the behalf of Thomasene Lot, DO,as directed by  Thomasene Lot, DO while in the presence of Thomasene Lot, DO.   I, Thomasene Lot, DO, have reviewed all documentation for this visit. The documentation on 02/01/23 for the exam, diagnosis, procedures, and orders are all accurate and complete.

## 2023-02-01 NOTE — Telephone Encounter (Signed)
I am fine with giving her 4 refills Please see her MyChart message she may mention of wanting to have a printed prescription Obviously if she wants a printed prescription please print it and she can pick it up

## 2023-02-02 LAB — COMPREHENSIVE METABOLIC PANEL
ALT: 30 IU/L (ref 0–32)
AST: 22 IU/L (ref 0–40)
Albumin/Globulin Ratio: 2.4 — ABNORMAL HIGH (ref 1.2–2.2)
Albumin: 4.7 g/dL (ref 3.8–4.9)
Alkaline Phosphatase: 69 IU/L (ref 44–121)
BUN/Creatinine Ratio: 23 (ref 9–23)
BUN: 16 mg/dL (ref 6–24)
Bilirubin Total: 0.3 mg/dL (ref 0.0–1.2)
CO2: 23 mmol/L (ref 20–29)
Calcium: 9.3 mg/dL (ref 8.7–10.2)
Chloride: 103 mmol/L (ref 96–106)
Creatinine, Ser: 0.7 mg/dL (ref 0.57–1.00)
Globulin, Total: 2 g/dL (ref 1.5–4.5)
Glucose: 80 mg/dL (ref 70–99)
Potassium: 4.3 mmol/L (ref 3.5–5.2)
Sodium: 142 mmol/L (ref 134–144)
Total Protein: 6.7 g/dL (ref 6.0–8.5)
eGFR: 101 mL/min/{1.73_m2} (ref >59)

## 2023-02-02 LAB — CBC WITH DIFFERENTIAL/PLATELET
Basophils Absolute: 0 10*3/uL (ref 0.0–0.2)
Basos: 1 %
EOS (ABSOLUTE): 0.1 10*3/uL (ref 0.0–0.4)
Eos: 3 %
Hematocrit: 40.6 % (ref 34.0–46.6)
Hemoglobin: 13.7 g/dL (ref 11.1–15.9)
Immature Grans (Abs): 0 10*3/uL (ref 0.0–0.1)
Immature Granulocytes: 0 %
Lymphocytes Absolute: 1.6 10*3/uL (ref 0.7–3.1)
Lymphs: 34 %
MCH: 29.5 pg (ref 26.6–33.0)
MCHC: 33.7 g/dL (ref 31.5–35.7)
MCV: 87 fL (ref 79–97)
Monocytes Absolute: 0.3 10*3/uL (ref 0.1–0.9)
Monocytes: 6 %
Neutrophils Absolute: 2.6 10*3/uL (ref 1.4–7.0)
Neutrophils: 56 %
Platelets: 201 10*3/uL (ref 150–450)
RBC: 4.65 x10E6/uL (ref 3.77–5.28)
RDW: 13 % (ref 11.7–15.4)
WBC: 4.7 10*3/uL (ref 3.4–10.8)

## 2023-02-02 LAB — T4, FREE: Free T4: 1.49 ng/dL (ref 0.82–1.77)

## 2023-02-02 LAB — LIPID PANEL
Chol/HDL Ratio: 2.6 ratio (ref 0.0–4.4)
Cholesterol, Total: 119 mg/dL (ref 100–199)
HDL: 45 mg/dL (ref >39)
LDL Chol Calc (NIH): 56 mg/dL (ref 0–99)
Triglycerides: 91 mg/dL (ref 0–149)
VLDL Cholesterol Cal: 18 mg/dL (ref 5–40)

## 2023-02-02 LAB — HEMOGLOBIN A1C
Est. average glucose Bld gHb Est-mCnc: 97 mg/dL
Hgb A1c MFr Bld: 5 % (ref 4.8–5.6)

## 2023-02-02 LAB — VITAMIN B12: Vitamin B-12: 561 pg/mL (ref 232–1245)

## 2023-02-02 LAB — TSH: TSH: 0.807 u[IU]/mL (ref 0.450–4.500)

## 2023-02-02 LAB — INSULIN, RANDOM: INSULIN: 21.5 u[IU]/mL (ref 2.6–24.9)

## 2023-02-02 LAB — VITAMIN D 25 HYDROXY (VIT D DEFICIENCY, FRACTURES): Vit D, 25-Hydroxy: 37.7 ng/mL (ref 30.0–100.0)

## 2023-02-02 NOTE — Telephone Encounter (Signed)
He will be fine to go ahead and print out a prescription for Zepbound 5 mg once weekly, 1 month supply with 2 refills Needs to have the diagnosis morbid obesity on it  Please have me sign this prescription on Friday then notify the patient that she can come by and pick it up  As per usual after she starts to 5 mg 3 to 4 weeks into that medication if she wants to go up to the next level she needs to let us know thank you

## 2023-02-05 ENCOUNTER — Encounter: Payer: Self-pay | Admitting: Family Medicine

## 2023-02-06 ENCOUNTER — Other Ambulatory Visit: Payer: Self-pay | Admitting: *Deleted

## 2023-02-06 MED ORDER — RYALTRIS 665-25 MCG/ACT NA SUSP: 2.0000 | Freq: Two times a day (BID) | NASAL | 5 refills | Status: DC

## 2023-02-07 MED ORDER — ZEPBOUND 5 MG/0.5ML ~~LOC~~ SOAJ: 5.0000 mg | SUBCUTANEOUS | 0 refills | Status: DC

## 2023-02-07 NOTE — Telephone Encounter (Signed)
Printed awaiting provider signature

## 2023-02-08 DIAGNOSIS — L814 Other melanin hyperpigmentation: Secondary | ICD-10-CM | POA: Diagnosis not present

## 2023-02-08 DIAGNOSIS — Z1283 Encounter for screening for malignant neoplasm of skin: Secondary | ICD-10-CM | POA: Diagnosis not present

## 2023-02-08 DIAGNOSIS — D485 Neoplasm of uncertain behavior of skin: Secondary | ICD-10-CM | POA: Diagnosis not present

## 2023-02-08 DIAGNOSIS — D225 Melanocytic nevi of trunk: Secondary | ICD-10-CM | POA: Diagnosis not present

## 2023-02-11 ENCOUNTER — Encounter: Payer: Self-pay | Admitting: Family Medicine

## 2023-02-13 MED ORDER — BUDESONIDE-FORMOTEROL FUMARATE 160-4.5 MCG/ACT IN AERO: INHALATION_SPRAY | RESPIRATORY_TRACT | 1 refills | Status: DC

## 2023-02-13 NOTE — Telephone Encounter (Signed)
May send in the Symbicort 3 inhalers, 1 refill with same directions  As for cream as I typically do not send in hide powered steroid cream as 28-month prescriptions because over use of strong steroid creams can have significant negative impact on skin  I am unable to see readily the reason for her using clobetasol.  Perhaps more information would allow me to feel more comfortable prescribing more otherwise I would stick with the current way thank you

## 2023-02-14 NOTE — Telephone Encounter (Signed)
Nurses This message is FYI I reviewed over the document that he needs sent Often in the situations insurance company and even with prior approval may or may not approve Symbicort.  They may request that other than dual action inhalers are the preferred on her formulary  We will not know this until the prior authorization is attempted  Zunaira should notify us closer to when she needs refills of the Symbicort then we can go through all the hoops thank you

## 2023-02-15 ENCOUNTER — Encounter (INDEPENDENT_AMBULATORY_CARE_PROVIDER_SITE_OTHER): Payer: Self-pay | Admitting: Family Medicine

## 2023-02-15 ENCOUNTER — Ambulatory Visit (INDEPENDENT_AMBULATORY_CARE_PROVIDER_SITE_OTHER): Payer: BC Managed Care – PPO | Admitting: Family Medicine

## 2023-02-15 VITALS — BP 90/55 | HR 68 | Temp 98.2°F | Ht 66.0 in | Wt 208.0 lb

## 2023-02-15 DIAGNOSIS — E88819 Insulin resistance, unspecified: Secondary | ICD-10-CM

## 2023-02-15 DIAGNOSIS — K760 Fatty (change of) liver, not elsewhere classified: Secondary | ICD-10-CM | POA: Diagnosis not present

## 2023-02-15 DIAGNOSIS — E782 Mixed hyperlipidemia: Secondary | ICD-10-CM | POA: Diagnosis not present

## 2023-02-15 DIAGNOSIS — E559 Vitamin D deficiency, unspecified: Secondary | ICD-10-CM

## 2023-02-15 DIAGNOSIS — Z6833 Body mass index (BMI) 33.0-33.9, adult: Secondary | ICD-10-CM

## 2023-02-15 DIAGNOSIS — E668 Other obesity: Secondary | ICD-10-CM

## 2023-02-15 DIAGNOSIS — E669 Obesity, unspecified: Secondary | ICD-10-CM

## 2023-02-15 MED ORDER — VITAMIN D (ERGOCALCIFEROL) 1.25 MG (50000 UNIT) PO CAPS
50000.0000 [IU] | ORAL_CAPSULE | ORAL | 0 refills | Status: DC
Start: 2023-02-15 — End: 2023-03-15

## 2023-02-15 NOTE — Progress Notes (Signed)
Carlye Grippe, D.O.  ABFM, ABOM Specializing in Clinical Bariatric Medicine  Office located at: [address] [place], [place]  [address]     Assessment and Plan:   Medications Discontinued During This Encounter  Medication Reason   tirzepatide (ZEPBOUND) 5 MG/0.5ML Pen Change in therapy     Meds ordered this encounter  Medications   Vitamin D, Ergocalciferol, (DRISDOL) 1.25 MG (50000 UNIT) CAPS capsule    Sig: Take 1 capsule (50,000 Units total) by mouth every 7 (seven) days.    Dispense:  4 capsule    Refill:  0     Insulin Resistance Assessment: Condition is new and not optimized. Labs were reviewed.  Lab Results  Component Value Date   HGBA1C 5.0 02/01/2023   HGBA1C 5.6 03/06/2014   INSULIN 21.5 02/01/2023   Lab Results  Component Value Date   VITAMINB12 561 02/01/2023   Lab Results  Component Value Date   TSH 0.807 02/01/2023   FREET4 1.49 02/01/2023   Lab Results  Component Value Date   WBC 4.7 02/01/2023   HGB 13.7 02/01/2023   HCT 40.6 02/01/2023   MCV 87 02/01/2023   PLT 201 02/01/2023   Lab Results  Component Value Date   CREATININE 0.70 02/01/2023   BUN 16 02/01/2023   NA 142 02/01/2023   K 4.3 02/01/2023   CL 103 02/01/2023   CO2 23 02/01/2023      Component Value Date/Time   PROT 6.7 02/01/2023 0903   ALBUMIN 4.7 02/01/2023 0903   AST 22 02/01/2023 0903   ALT 30 02/01/2023 0903   ALKPHOS 69 02/01/2023 0903   BILITOT 0.3 02/01/2023 0903   BILIDIR 0.10 11/01/2022 0843   IBILI NOT CALCULATED 10/26/2018 1300  She is not currently on any medications for this condition. This is diet controlled. Her hunger and cravings were mostly controlled when eating on plan. Labs indicate that: - Insulin levels are approximately 4 times the normal value. Counseling was done on the physiological aspect of Insulin Resistance.  - B12 levels are within the normal range of 500+ - Thyroid function is normal. - Blood counts are normal and she is  not anemic. - Kidney and Liver function are normal.  - Sodium levels are near the high end, which likely indicates that she is not drinking enough water.   Plan: - Continue to decrease simple carbs/ sugars; increase fiber and proteins -> follow her meal plan.   -Unless pre-existing renal or cardiopulmonary conditions exist which patient was told to limit their fluid intake by another provider, I recommended roughly one half of their weight in pounds, to be the approximate ounces of non-caloric, non-caffeinated beverages they should drink per day; including more if they are engaging in exercise.  - Explained role of simple carbs and insulin levels on hunger and cravings - Missie will continue to work on weight loss, exercise, via their meal plan we devised to help decrease the risk of progressing to diabetes.  - We will recheck A1c and fasting insulin level in approximately 3 months from last check, or as deemed appropriate.     Vitamin D Deficiency Assessment: Condition is new and not optimized. Lab Results  Component Value Date   VD25OH 37.7 02/01/2023   VD25OH 66.9 02/14/2020  - Labs indicate that her Vitamin D levels are not within the recommended range of 50-80. - She is not currently on any Vitamin D supplement.   Plan: - Begin Ergocalciferol 50K IU  weekly.  - I discussed the importance of vitamin D to the patient's health and well-being as well as to their ability to lose weight.  - I reviewed possible symptoms of low Vitamin D:  low energy, depressed mood, muscle aches, joint aches, osteoporosis etc. with patient - It has been show that administration of vitamin D supplementation leads to improved satiety and a decrease in inflammatory markers.  Hence, low Vitamin D levels may be linked to an increased risk of cardiovascular events and even increased risk of cancers- such as colon and breast. - weight loss will likely improve availability of vitamin D, thus encouraged Anuja to continue  with meal plan and their weight loss efforts to further improve this condition.  Thus, we will need to monitor levels regularly (every 3-4 mo on average) to keep levels within normal limits and prevent over supplementation.   NAFLD (nonalcoholic fatty liver disease) Assessment: Condition is stable. Labs were reviewed.  Lab Results  Component Value Date   ALT 30 02/01/2023  - On 08/16/21, patient saw GI and was diagnosed with NAFLD. Diagnoses was confirmed via ultrasound.  - Labs indicate that her liver enzyme levels are stable.  Plan: - Continue following up with PCP/specialist for this condition. - The mainstay of treatment of NAFLD includes lifestyle modification to achieve weight loss, at least 7% of current body weight. Low carbohydrate diets can be beneficial in improving NAFLD liver histology. Additionally, exercise, even the absence of weight loss can have beneficial effects on the patient's metabolic profile and liver health.    Mixed hyperlipidemia Assessment: Condition is stable. Labs were reviewed.  Lab Results  Component Value Date   CHOL 119 02/01/2023   HDL 45 02/01/2023   LDLCALC 56 02/01/2023   TRIG 91 02/01/2023   CHOLHDL 2.6 02/01/2023  No issues with Crestor 10 mg daily, compliance good.  Labs were reviewed with patient and indicate that: - Her LDL levels have improved from 123 on 11/01/22 to 56.  - Her TRIG levels have also improved from 155 on 11/02/22 to 91.   Plan:  - Continue with med as prescribed by PCP.  - We extensively discussed several lifestyle modifications today and she will continue to work on diet, exercise and weight loss efforts.  - I stressed the importance that patient continue with our prudent nutritional plan that is low in saturated and trans fats, and low in fatty carbs to improve these numbers.  - We will continue routine screening as patient continues to achieve health goals along their weight loss journey    TREATMENT PLAN FOR  OBESITY: Class 1 obesity with serious comorbidity and body mass index (BMI) of 34.0 to 34.9 in adult, unspecified obesity type Assessment: Condition is improving, but not optimized. Biometric data collected today, was reviewed with patient.  Fat mass has decreased by 4.6 lb. Muscle mass has decreased by 0.4 lb. Total body water has increased by 1.8 lb.  - No issues with Zepbound 2.5 mg weekly per Dr.Luking. Denies any side effects.  Plan:  - Continue with Zepbound as recommended by PCP. - Evianna is currently in the action stage of change. As such, her goal is to continue weight management plan. - Delecia will work on healthier eating habits and continue the Category 2 meal plan and journaling  1100-1200 calories and 85+ protein daily - We discussed ways that patient can increase her protein intake throughout the day. For example, she can have a light cottage cheese with strawberries  or a Fiber one protein bar.  - I informed her that high protein foods have a 10:1 ratio of calories to protein.   - I showed her a Black Bean Spaghetti (high in protein and fiber, low in calorie), which she may  incorporate into her meal plan.   Behavioral Intervention Additional resources provided today: Insulin Resistance handout. Evidence-based interventions for health behavior change were utilized today including the discussion of self monitoring techniques, problem-solving barriers and SMART goal setting techniques.   Regarding patient's less desirable eating habits and patterns, we employed the technique of small changes.  Pt will specifically work on: continue prudent nutritional plan and journaling for next visit.    Recommended Physical Activity Goals Dorthey has been advised to work up to 150 minutes of moderate intensity aerobic activity a week and strengthening exercises 2-3 times per week for cardiovascular health, weight loss maintenance and preservation of muscle mass.  She has agreed to Continue current level  of physical activity   FOLLOW UP: Return in about 2 weeks (around 03/01/2023). She was informed of the importance of frequent follow up visits to maximize her success with intensive lifestyle modifications for her multiple health conditions.   Subjective:   Chief complaint: Obesity Brynnlee is here to discuss her progress with her obesity treatment plan. She is on the the Category 2 Plan and states she is following her eating plan approximately 100 % of the time. She states she is walking 35-45 minutes 2-3 days per week.  Interval History:  Wana P Roundtree is here today for her first follow-up office visit since starting the program with [place].  Since last office visit: - She reports that following the meal plan was "not as hard as she thought it would be".  - She endorses that her desire for snacking has decreased because she has been eating most of her lean protein.  - For lunch, she typically eats 4 ounces of protein.  - In general, the data from her food journaling shows that she is eating under 1,000 calories daily.  - She has made it a rule to not eat after 7:30 pm.  - She has not been struggling with emotional eating since last OV.   All blood work/ lab tests that were recently ordered by myself or an outside provider were reviewed with patient today per their request. Extended time was spent counseling her on all new disease processes that were discovered or preexisting ones that are affected by BMI.  she understands that many of these abnormalities will need to monitored regularly along with the current treatment plan of prudent dietary changes, in which we are making each and every office visit, to improve these health parameters.  Pharmacotherapy for weight loss: She is currently taking Zepbound for medical weight loss.  Denies side effects.    Review of Systems:  Pertinent positives were addressed with patient today.  Weight Summary and Biometrics   Weight Lost Since Last Visit: 5  lb  Weight Gained Since Last Visit: 0   Vitals Temp: 98.2 F (36.8 C) BP: (!) 90/55 Pulse Rate: 68 SpO2: 97 %   Anthropometric Measurements Height: 5\' 6"  (1.676 m) Weight: 208 lb (94.3 kg) BMI (Calculated): 33.59 Weight at Last Visit: 213 lb Weight Lost Since Last Visit: 5 lb Weight Gained Since Last Visit: 0 Starting Weight: 213 lb Total Weight Loss (lbs): 5 lb (2.268 kg) Peak Weight: 229 lb   Body Composition  Body Fat %: 42.2 % Fat  Mass (lbs): 87.8 lbs Muscle Mass (lbs): 114.2 lbs Total Body Water (lbs): 77 lbs Visceral Fat Rating : 11   Other Clinical Data Fasting: No Labs: No Today's Visit #: 2 Starting Date: 02/01/23   Objective:   PHYSICAL EXAM:  Blood pressure (!) 90/55, pulse 68, temperature 98.2 F (36.8 C), height 5\' 6"  (1.676 m), weight 208 lb (94.3 kg), last menstrual period 11/04/2011, SpO2 97 %. Body mass index is 33.57 kg/m.  General: Well Developed, well nourished, and in no acute distress.  HEENT: Normocephalic, atraumatic Skin: Warm and dry, cap RF less 2 sec, good turgor Chest:  Normal excursion, shape, no gross abn Respiratory: speaking in full sentences, no conversational dyspnea NeuroM-Sk: Ambulates w/o assistance, moves * 4 Psych: A and O *3, insight good, mood-full  DIAGNOSTIC DATA REVIEWED:  BMET    Component Value Date/Time   NA 142 02/01/2023 0903   K 4.3 02/01/2023 0903   CL 103 02/01/2023 0903   CO2 23 02/01/2023 0903   GLUCOSE 80 02/01/2023 0903   GLUCOSE 94 10/26/2018 1300   BUN 16 02/01/2023 0903   CREATININE 0.70 02/01/2023 0903   CALCIUM 9.3 02/01/2023 0903   GFRNONAA 91 08/14/2020 0815   GFRAA 105 08/14/2020 0815   Lab Results  Component Value Date   HGBA1C 5.0 02/01/2023   HGBA1C 5.6 03/06/2014   Lab Results  Component Value Date   INSULIN 21.5 02/01/2023   Lab Results  Component Value Date   TSH 0.807 02/01/2023   CBC    Component Value Date/Time   WBC 4.7 02/01/2023 0903   WBC 6.1  10/26/2018 1300   RBC 4.65 02/01/2023 0903   RBC 4.40 10/26/2018 1300   HGB 13.7 02/01/2023 0903   HCT 40.6 02/01/2023 0903   PLT 201 02/01/2023 0903   MCV 87 02/01/2023 0903   MCH 29.5 02/01/2023 0903   MCH 29.8 10/26/2018 1300   MCHC 33.7 02/01/2023 0903   MCHC 32.2 10/26/2018 1300   RDW 13.0 02/01/2023 0903   Iron Studies No results found for: "IRON", "TIBC", "FERRITIN", "IRONPCTSAT" Lipid Panel     Component Value Date/Time   CHOL 119 02/01/2023 0903   TRIG 91 02/01/2023 0903   HDL 45 02/01/2023 0903   CHOLHDL 2.6 02/01/2023 0903   CHOLHDL 4.2 11/29/2014 0902   VLDL 27 11/29/2014 0902   LDLCALC 56 02/01/2023 0903   Hepatic Function Panel     Component Value Date/Time   PROT 6.7 02/01/2023 0903   ALBUMIN 4.7 02/01/2023 0903   AST 22 02/01/2023 0903   ALT 30 02/01/2023 0903   ALKPHOS 69 02/01/2023 0903   BILITOT 0.3 02/01/2023 0903   BILIDIR 0.10 11/01/2022 0843   IBILI NOT CALCULATED 10/26/2018 1300      Component Value Date/Time   TSH 0.807 02/01/2023 0903   Nutritional Lab Results  Component Value Date   VD25OH 37.7 02/01/2023   VD25OH 66.9 02/14/2020    Attestations:   Reviewed by clinician on day of visit: allergies, medications, problem list, medical history, surgical history, family history, social history, and previous encounter notes.   Patient was in the office today and time spent on visit including pre-visit chart review and post-visit care/coordination of care and electronic medical record documentation was 60+ minutes. 50% of the time was in face to face counseling of this patient's medical condition(s) and providing education on treatment options to include the first-line treatment of diet and lifestyle modification.  I,Special Puri,acting as a Neurosurgeon for [employer]  Jahnai Slingerland, DO.,have documented all relevant documentation on the behalf of Thomasene Lot, DO,as directed by  Thomasene Lot, DO while in the presence of Thomasene Lot, DO.   I,  Thomasene Lot, DO, have reviewed all documentation for this visit. The documentation on 02/15/23 for the exam, diagnosis, procedures, and orders are all accurate and complete.

## 2023-02-21 ENCOUNTER — Telehealth (INDEPENDENT_AMBULATORY_CARE_PROVIDER_SITE_OTHER): Payer: BC Managed Care – PPO | Admitting: Psychology

## 2023-02-21 ENCOUNTER — Other Ambulatory Visit: Payer: Self-pay | Admitting: Family Medicine

## 2023-03-01 ENCOUNTER — Encounter (INDEPENDENT_AMBULATORY_CARE_PROVIDER_SITE_OTHER): Payer: Self-pay | Admitting: Family Medicine

## 2023-03-01 ENCOUNTER — Ambulatory Visit (INDEPENDENT_AMBULATORY_CARE_PROVIDER_SITE_OTHER): Payer: BC Managed Care – PPO | Admitting: Family Medicine

## 2023-03-01 VITALS — BP 101/67 | HR 63 | Temp 98.2°F | Ht 66.0 in | Wt 206.0 lb

## 2023-03-01 DIAGNOSIS — E559 Vitamin D deficiency, unspecified: Secondary | ICD-10-CM

## 2023-03-01 DIAGNOSIS — E668 Other obesity: Secondary | ICD-10-CM

## 2023-03-01 DIAGNOSIS — Z6834 Body mass index (BMI) 34.0-34.9, adult: Secondary | ICD-10-CM

## 2023-03-01 DIAGNOSIS — E88819 Insulin resistance, unspecified: Secondary | ICD-10-CM | POA: Diagnosis not present

## 2023-03-01 DIAGNOSIS — Z6833 Body mass index (BMI) 33.0-33.9, adult: Secondary | ICD-10-CM

## 2023-03-01 NOTE — Progress Notes (Signed)
Nancy Robertson, D.O.  ABFM, ABOM Specializing in Clinical Bariatric Medicine  Office located at: [address] [place], [place]  [address]     Assessment and Plan:   Insulin resistance Assessment: Condition is not optimized.   Lab Results  Component Value Date   HGBA1C 5.0 02/01/2023   HGBA1C 5.6 03/06/2014   INSULIN 21.5 02/01/2023  - Pt is not on any medication for this condition. This is diet controlled.  - Her hunger and cravings are mostly controlled when eating on plan.   Plan: - Rayleigh will continue to work on weight loss, exercise, via their meal plan we devised to help decrease the risk of progressing to prediabetes. - We will recheck A1c and fasting insulin level in approximately 3 months from last check, or as deemed appropriate.     Vitamin D deficiency Assessment: Condition is not optimized. Lab Results  Component Value Date   VD25OH 37.7 02/01/2023   VD25OH 66.9 02/14/2020  - No issues with Ergocalciferol 50K IU weekly. Compliance good since last labs reviewed.   Plan: - Continue with supplement.  - Will continue to monitor to keep levels within normal limits and prevent over supplementation.   TREATMENT PLAN FOR OBESITY: Class 1 obesity with serious comorbidity and body mass index (BMI) of 34.0 to 34.9 in adult, unspecified obesity type Assessment:  [name] is here to discuss her progress with her obesity treatment plan along with follow-up of her obesity related diagnoses. See Medical Weight Management Flowsheet for complete bioelectrical impedance results.  Condition is improving, but not optimized. Biometric data collected today, was reviewed with patient.   Since last office visit on 02/15/23 patient's Fat mass has decreased by 1.2 lb. Muscle mass has not changed.  Total body water has increased by 1.4 lb.  Counseling done on how various foods will affect these numbers and how to maximize success  Total lbs lost to date: -7  Total weight  loss percentage to date: 3.29   No issues with Zepbound 2.5 mg weekly per her PCP. Denies any side effects.  Endorses that it has been difficult to find the Zepbound but she has been able to.   Plan:  - Continue with med as recommended by Dr.Luking.  - Continue the Category 2 meal plan and journaling 1100-1200 calories and 85+ protein daily.   Behavioral Intervention Additional resources provided today:  Food journaling log Evidence-based interventions for health behavior change were utilized today including the discussion of self monitoring techniques, problem-solving barriers and SMART goal setting techniques.   Regarding patient's less desirable eating habits and patterns, we employed the technique of small changes.  Pt will specifically work on: drinking at least 80 ounces of water daily and staying within caloric and protein goals for next visit.    Recommended Physical Activity Goals  Nairi has been advised to slowly work up to 150 minutes of moderate intensity aerobic activity a week and strengthening exercises 2-3 times per week for cardiovascular health, weight loss maintenance and preservation of muscle mass.   She has agreed to Continue current level of physical activity   FOLLOW UP: Return in about 2 weeks (around 03/15/2023). She was informed of the importance of frequent follow up visits to maximize her success with intensive lifestyle modifications for her multiple health conditions.   Subjective:   Chief complaint: Obesity [name] is here to discuss her progress with her obesity treatment plan. She is on the the Category 2 Plan  and journaling 1100-1200 calories and 85+ protein daily. and states she is following her eating plan approximately 99% of the time. She states she is walking some.  Interval History:  [name] is here for a follow up office visit.     Since last office visit:   - Her following caloric values on different days from journaling her intake: 1299,  919, 878, 1091, 1443, 1279, 1173.  - In general, some days she was over in calories and on other days she was under.  - When eating on plan, her hunger and cravings are well controlled.  - Endorses drinking 64-72 ounces of water daily.     Pharmacotherapy for weight loss: She is currently taking  Zepbound  for medical weight loss.  Denies side effects.    Review of Systems:  Pertinent positives were addressed with patient today.  Weight Summary and Biometrics   Weight Lost Since Last Visit: 2lb  Weight Gained Since Last Visit: 0    Vitals Temp: 98.2 F (36.8 C) BP: 101/67 Pulse Rate: 63 SpO2: 98 %   Anthropometric Measurements Height: 5\' 6"  (1.676 m) Weight: 206 lb (93.4 kg) BMI (Calculated): 33.27 Weight at Last Visit: 208lb Weight Lost Since Last Visit: 2lb Weight Gained Since Last Visit: 0 Starting Weight: 213lb Total Weight Loss (lbs): 7 lb (3.175 kg)   Body Composition  Body Fat %: 41.9 % Fat Mass (lbs): 86.6 lbs Muscle Mass (lbs): 114.2 lbs Total Body Water (lbs): 78.4 lbs Visceral Fat Rating : 11   Other Clinical Data Fasting: no Labs: no Today's Visit #: 3 Starting Date: 02/01/23   Objective:   PHYSICAL EXAM: Blood pressure 101/67, pulse 63, temperature 98.2 F (36.8 C), height 5\' 6"  (1.676 m), weight 206 lb (93.4 kg), last menstrual period 11/04/2011, SpO2 98 %. Body mass index is 33.25 kg/m.  General: Well Developed, well nourished, and in no acute distress.  HEENT: Normocephalic, atraumatic Skin: Warm and dry, cap RF less 2 sec, good turgor Chest:  Normal excursion, shape, no gross abn Respiratory: speaking in full sentences, no conversational dyspnea NeuroM-Sk: Ambulates w/o assistance, moves * 4 Psych: A and O *3, insight good, mood-full  DIAGNOSTIC DATA REVIEWED:  BMET    Component Value Date/Time   NA 142 02/01/2023 0903   K 4.3 02/01/2023 0903   CL 103 02/01/2023 0903   CO2 23 02/01/2023 0903   GLUCOSE 80 02/01/2023 0903    GLUCOSE 94 10/26/2018 1300   BUN 16 02/01/2023 0903   CREATININE 0.70 02/01/2023 0903   CALCIUM 9.3 02/01/2023 0903   GFRNONAA 91 08/14/2020 0815   GFRAA 105 08/14/2020 0815   Lab Results  Component Value Date   HGBA1C 5.0 02/01/2023   HGBA1C 5.6 03/06/2014   Lab Results  Component Value Date   INSULIN 21.5 02/01/2023   Lab Results  Component Value Date   TSH 0.807 02/01/2023   CBC    Component Value Date/Time   WBC 4.7 02/01/2023 0903   WBC 6.1 10/26/2018 1300   RBC 4.65 02/01/2023 0903   RBC 4.40 10/26/2018 1300   HGB 13.7 02/01/2023 0903   HCT 40.6 02/01/2023 0903   PLT 201 02/01/2023 0903   MCV 87 02/01/2023 0903   MCH 29.5 02/01/2023 0903   MCH 29.8 10/26/2018 1300   MCHC 33.7 02/01/2023 0903   MCHC 32.2 10/26/2018 1300   RDW 13.0 02/01/2023 0903   Iron Studies No results found for: "IRON", "TIBC", "FERRITIN", "IRONPCTSAT" Lipid Panel  Component Value Date/Time   CHOL 119 02/01/2023 0903   TRIG 91 02/01/2023 0903   HDL 45 02/01/2023 0903   CHOLHDL 2.6 02/01/2023 0903   CHOLHDL 4.2 11/29/2014 0902   VLDL 27 11/29/2014 0902   LDLCALC 56 02/01/2023 0903   Hepatic Function Panel     Component Value Date/Time   PROT 6.7 02/01/2023 0903   ALBUMIN 4.7 02/01/2023 0903   AST 22 02/01/2023 0903   ALT 30 02/01/2023 0903   ALKPHOS 69 02/01/2023 0903   BILITOT 0.3 02/01/2023 0903   BILIDIR 0.10 11/01/2022 0843   IBILI NOT CALCULATED 10/26/2018 1300      Component Value Date/Time   TSH 0.807 02/01/2023 0903   Nutritional Lab Results  Component Value Date   VD25OH 37.7 02/01/2023   VD25OH 66.9 02/14/2020    Attestations:   Reviewed by clinician on day of visit: allergies, medications, problem list, medical history, surgical history, family history, social history, and previous encounter notes.  Patient was in the office today and time spent on visit including pre-visit chart review and post-visit care/coordination of care and electronic medical  record documentation was 30 minutes. 50% of that time was in face to face counseling of this patient's medical condition(s) and providing education on treatment options to always include the first-line treatment of diet and lifestyle modification.    I,Special Puri,acting as a Neurosurgeon for [employer], DO.,have documented all relevant documentation on the behalf of Thomasene Lot, DO,as directed by  Thomasene Lot, DO while in the presence of Thomasene Lot, DO.   I, Thomasene Lot, DO, have reviewed all documentation for this visit. The documentation on 03/01/23 for the exam, diagnosis, procedures, and orders are all accurate and complete.

## 2023-03-03 ENCOUNTER — Encounter: Payer: Self-pay | Admitting: Genetic Counselor

## 2023-03-03 NOTE — Progress Notes (Signed)
UPDATE: PMS2 [id number] ([id number]) VUS has been amended to Benign. Amended report date is 12/14/2022.

## 2023-03-07 ENCOUNTER — Encounter: Payer: Self-pay | Admitting: Family Medicine

## 2023-03-07 MED ORDER — ROSUVASTATIN CALCIUM 10 MG PO TABS: 10.0000 mg | ORAL_TABLET | Freq: Every day | ORAL | 1 refills | Status: DC

## 2023-03-15 ENCOUNTER — Encounter (INDEPENDENT_AMBULATORY_CARE_PROVIDER_SITE_OTHER): Payer: Self-pay | Admitting: Family Medicine

## 2023-03-15 ENCOUNTER — Ambulatory Visit (INDEPENDENT_AMBULATORY_CARE_PROVIDER_SITE_OTHER): Payer: BC Managed Care – PPO | Admitting: Family Medicine

## 2023-03-15 VITALS — BP 116/67 | HR 74 | Temp 98.1°F | Ht 66.0 in | Wt 205.0 lb

## 2023-03-15 DIAGNOSIS — E668 Other obesity: Secondary | ICD-10-CM | POA: Diagnosis not present

## 2023-03-15 DIAGNOSIS — E559 Vitamin D deficiency, unspecified: Secondary | ICD-10-CM

## 2023-03-15 DIAGNOSIS — E669 Obesity, unspecified: Secondary | ICD-10-CM

## 2023-03-15 DIAGNOSIS — Z6833 Body mass index (BMI) 33.0-33.9, adult: Secondary | ICD-10-CM

## 2023-03-15 DIAGNOSIS — E88819 Insulin resistance, unspecified: Secondary | ICD-10-CM | POA: Diagnosis not present

## 2023-03-15 MED ORDER — VITAMIN D (ERGOCALCIFEROL) 1.25 MG (50000 UNIT) PO CAPS
50000.0000 [IU] | ORAL_CAPSULE | ORAL | 0 refills | Status: DC
Start: 2023-03-15 — End: 2023-04-12

## 2023-03-15 NOTE — Progress Notes (Signed)
Nancy Robertson, D.O.  ABFM, ABOM Specializing in Clinical Bariatric Medicine  Office located at: [address] [place], [place]  [address]     Assessment and Plan:   Medications Discontinued During This Encounter  Medication Reason   Vitamin D, Ergocalciferol, (DRISDOL) 1.25 MG (50000 UNIT) CAPS capsule Reorder     Meds ordered this encounter  Medications   Vitamin D, Ergocalciferol, (DRISDOL) 1.25 MG (50000 UNIT) CAPS capsule    Sig: Take 1 capsule (50,000 Units total) by mouth every 7 (seven) days.    Dispense:  4 capsule    Refill:  0     Vitamin D deficiency Assessment: Condition is not optimized. Lab Results  Component Value Date   VD25OH 37.7 02/01/2023   VD25OH 66.9 02/14/2020  - She has been compliant and tolerant with Ergocalciferol 50K IU weekly. Denies any N/V/D.   Plan: - Continue with OTC supplement. Will reorder this today.      - weight loss will likely improve availability of vitamin D, thus encouraged Nancy Robertson to continue with meal plan and their weight loss efforts to further improve this condition.  Thus, we will need to monitor levels regularly (every 3-4 mo on average) to keep levels within normal limits and prevent over supplementation.   Insulin resistance Assessment: Condition is diet controlled and not optimized.  Lab Results  Component Value Date   HGBA1C 5.0 02/01/2023   HGBA1C 5.6 03/06/2014   INSULIN 21.5 02/01/2023  - Patient has been working on increasing her protein intake and notes that her hunger and cravings are controlled.   Plan:  - Continue her prudent nutritional plan that is low in simple carbohydrates, saturated fats and trans fats to goal of 5-10% weight loss to achieve significant health benefits.  Pt encouraged to continually advance exercise and cardiovascular fitness as tolerated throughout weight loss journey.    TREATMENT PLAN FOR OBESITY: Class 1 obesity with serious comorbidity and body mass index (BMI) of 34.0  to 34.9 in adult, unspecified obesity type Assessment:  Nancy Robertson is here to discuss her progress with her obesity treatment plan along with follow-up of her obesity related diagnoses. See Medical Weight Management Flowsheet for complete bioelectrical impedance results.  Condition is improving. Biometric data collected today, was reviewed with patient.   Since last office visit on 03/01/23 patient's  Muscle mass has increased by 0.8 lb. Fat mass has decreased by 2.4 lb. Total body water has decreased by 1.8 lb.  Counseling done on how various foods will affect these numbers and how to maximize success  Total lbs lost to date: 8 Total weight loss percentage to date: 3.76   Tolerating Zepbound 2.5 mg weekly well. No concerns.   Plan:  - Continue with Zepbound as prescribed by PCP. - Continue the Category 2 meal plan and journaling 1100-1200 calories and 85+ protein daily if she desires.   Behavioral Intervention Additional resources provided today: Protein Overnight Oats Recipe and Greek Yogurt Chicken Salad Recipe  Evidence-based interventions for health behavior change were utilized today including the discussion of self monitoring techniques, problem-solving barriers and SMART goal setting techniques.   Regarding patient's less desirable eating habits and patterns, we employed the technique of small changes.  Pt will specifically work on: continue to focus on eating healthy for next visit.    Recommended Physical Activity Goals  Nancy Robertson of moderate intensity aerobic activity a week and strengthening  exercises 2-3 times per week for cardiovascular health, weight loss maintenance and preservation of muscle mass.   She has agreed to Think about ways to increase daily physical activity and overcoming barriers to exercise   FOLLOW UP: Return in about 2 weeks (around 03/29/2023). She was informed of the importance of frequent follow up visits  to maximize her success with intensive lifestyle modifications for her multiple health conditions.   Subjective:   Chief complaint: Obesity Nancy Robertson is here to discuss her progress with her obesity treatment plan. She is on the the Category 2 Plan and keeping a food journal and adhering to recommended goals of 1100-1200 calories and 85+ protein and states she is following her eating plan approximately 75% of the time. She states she is walking 30-45 Robertson 1 days per week.  Interval History:  Nancy Robertson is here for a follow up office visit.     Since last office visit:   - She has not been journaling her intake, however has been mindful of her food, protein, and water intake. - Pt states that she needs to work on not being too critical on herself.  - It has been difficult for her to eat 8 ounces of lean protein at night.  - Not having issues with hunger or cravings.   Pharmacotherapy Current Anti-obesity medications: Zepbound Reported side effects: None  Review of Systems:  Pertinent positives were addressed with patient today.  Weight Summary and Biometrics   Weight Lost Since Last Visit: 1 lb  Weight Gained Since Last Visit: 0    Vitals Temp: 98.1 F (36.7 C) BP: 116/67 Pulse Rate: 74 SpO2: 97 %   Anthropometric Measurements Height: 5\' 6"  (1.676 m) Weight: 205 lb (93 kg) BMI (Calculated): 33.1 Weight at Last Visit: 206 lb Weight Lost Since Last Visit: 1 lb Weight Gained Since Last Visit: 0 Starting Weight: 213 lb Total Weight Loss (lbs): 8 lb (3.629 kg) Peak Weight: 229 lb   Body Composition  Body Fat %: 40.9 % Fat Mass (lbs): 84.2 lbs Muscle Mass (lbs): 115.4 lbs Total Body Water (lbs): 76.6 lbs Visceral Fat Rating : 11   Other Clinical Data Fasting: No Labs: No Today's Visit #: 4 Starting Date: 02/01/23   Objective:   PHYSICAL EXAM: Blood pressure 116/67, pulse 74, temperature 98.1 F (36.7 C), height 5\' 6"  (1.676 m), weight 205 lb (93 kg),  last menstrual period 11/04/2011, SpO2 97 %. Body mass index is 33.09 kg/m.  General: Well Developed, well nourished, and in no acute distress.  HEENT: Normocephalic, atraumatic Skin: Warm and dry, cap RF less 2 sec, good turgor Chest:  Normal excursion, shape, no gross abn Respiratory: speaking in full sentences, no conversational dyspnea NeuroM-Sk: Ambulates w/o assistance, moves * 4 Psych: A and O *3, insight good, mood-full  DIAGNOSTIC DATA REVIEWED:  BMET    Component Value Date/Time   NA 142 02/01/2023 0903   K 4.3 02/01/2023 0903   CL 103 02/01/2023 0903   CO2 23 02/01/2023 0903   GLUCOSE 80 02/01/2023 0903   GLUCOSE 94 10/26/2018 1300   BUN 16 02/01/2023 0903   CREATININE 0.70 02/01/2023 0903   CALCIUM 9.3 02/01/2023 0903   GFRNONAA 91 08/14/2020 0815   GFRAA 105 08/14/2020 0815   Lab Results  Component Value Date   HGBA1C 5.0 02/01/2023   HGBA1C 5.6 03/06/2014   Lab Results  Component Value Date   INSULIN 21.5 02/01/2023   Lab Results  Component Value Date  TSH 0.807 02/01/2023   CBC    Component Value Date/Time   WBC 4.7 02/01/2023 0903   WBC 6.1 10/26/2018 1300   RBC 4.65 02/01/2023 0903   RBC 4.40 10/26/2018 1300   HGB 13.7 02/01/2023 0903   HCT 40.6 02/01/2023 0903   PLT 201 02/01/2023 0903   MCV 87 02/01/2023 0903   MCH 29.5 02/01/2023 0903   MCH 29.8 10/26/2018 1300   MCHC 33.7 02/01/2023 0903   MCHC 32.2 10/26/2018 1300   RDW 13.0 02/01/2023 0903   Iron Studies No results found for: "IRON", "TIBC", "FERRITIN", "IRONPCTSAT" Lipid Panel     Component Value Date/Time   CHOL 119 02/01/2023 0903   TRIG 91 02/01/2023 0903   HDL 45 02/01/2023 0903   CHOLHDL 2.6 02/01/2023 0903   CHOLHDL 4.2 11/29/2014 0902   VLDL 27 11/29/2014 0902   LDLCALC 56 02/01/2023 0903   Hepatic Function Panel     Component Value Date/Time   PROT 6.7 02/01/2023 0903   ALBUMIN 4.7 02/01/2023 0903   AST 22 02/01/2023 0903   ALT 30 02/01/2023 0903   ALKPHOS  69 02/01/2023 0903   BILITOT 0.3 02/01/2023 0903   BILIDIR 0.10 11/01/2022 0843   IBILI NOT CALCULATED 10/26/2018 1300      Component Value Date/Time   TSH 0.807 02/01/2023 0903   Nutritional Lab Results  Component Value Date   VD25OH 37.7 02/01/2023   VD25OH 66.9 02/14/2020    Attestations:   Reviewed by clinician on day of visit: allergies, medications, problem list, medical history, surgical history, family history, social history, and previous encounter notes.    I,Special Puri,acting as a Neurosurgeon for [employer], DO.,have documented all relevant documentation on the behalf of Thomasene Lot, DO,as directed by  Thomasene Lot, DO while in the presence of Thomasene Lot, DO.   I, Thomasene Lot, DO, have reviewed all documentation for this visit. The documentation on 03/15/23 for the exam, diagnosis, procedures, and orders are all accurate and complete.

## 2023-03-21 ENCOUNTER — Telehealth: Payer: Self-pay | Admitting: Nurse Practitioner

## 2023-03-21 NOTE — Telephone Encounter (Signed)
Patient has physical on 7/26 and needing labs done

## 2023-03-24 ENCOUNTER — Encounter (INDEPENDENT_AMBULATORY_CARE_PROVIDER_SITE_OTHER): Payer: Self-pay | Admitting: Family Medicine

## 2023-03-24 ENCOUNTER — Other Ambulatory Visit: Payer: Self-pay | Admitting: Nurse Practitioner

## 2023-03-24 DIAGNOSIS — E781 Pure hyperglyceridemia: Secondary | ICD-10-CM

## 2023-03-24 DIAGNOSIS — E782 Mixed hyperlipidemia: Secondary | ICD-10-CM

## 2023-03-24 NOTE — Telephone Encounter (Signed)
Patient has physical on 7/26 and needing labs done, please advise

## 2023-03-24 NOTE — Telephone Encounter (Signed)
Patient aware per provider notes and recommendations .

## 2023-03-27 NOTE — Telephone Encounter (Signed)
Please reschedule

## 2023-03-29 ENCOUNTER — Ambulatory Visit (INDEPENDENT_AMBULATORY_CARE_PROVIDER_SITE_OTHER): Payer: BC Managed Care – PPO | Admitting: Family Medicine

## 2023-04-03 ENCOUNTER — Other Ambulatory Visit: Payer: Self-pay | Admitting: Family Medicine

## 2023-04-06 ENCOUNTER — Other Ambulatory Visit (HOSPITAL_COMMUNITY): Payer: Self-pay | Admitting: Nurse Practitioner

## 2023-04-06 DIAGNOSIS — Z1231 Encounter for screening mammogram for malignant neoplasm of breast: Secondary | ICD-10-CM

## 2023-04-12 ENCOUNTER — Encounter (INDEPENDENT_AMBULATORY_CARE_PROVIDER_SITE_OTHER): Payer: Self-pay | Admitting: Physician Assistant

## 2023-04-12 ENCOUNTER — Ambulatory Visit (INDEPENDENT_AMBULATORY_CARE_PROVIDER_SITE_OTHER): Payer: BC Managed Care – PPO | Admitting: Physician Assistant

## 2023-04-12 VITALS — BP 98/63 | HR 77 | Temp 98.2°F | Ht 66.0 in | Wt 199.0 lb

## 2023-04-12 DIAGNOSIS — Z6832 Body mass index (BMI) 32.0-32.9, adult: Secondary | ICD-10-CM

## 2023-04-12 DIAGNOSIS — E559 Vitamin D deficiency, unspecified: Secondary | ICD-10-CM

## 2023-04-12 DIAGNOSIS — E669 Obesity, unspecified: Secondary | ICD-10-CM | POA: Diagnosis not present

## 2023-04-12 DIAGNOSIS — E88819 Insulin resistance, unspecified: Secondary | ICD-10-CM

## 2023-04-12 DIAGNOSIS — E782 Mixed hyperlipidemia: Secondary | ICD-10-CM | POA: Diagnosis not present

## 2023-04-12 MED ORDER — VITAMIN D (ERGOCALCIFEROL) 1.25 MG (50000 UNIT) PO CAPS
50000.0000 [IU] | ORAL_CAPSULE | ORAL | 0 refills | Status: DC
Start: 2023-04-12 — End: 2023-05-12

## 2023-04-12 NOTE — Progress Notes (Signed)
.[facility]  Office: [phone]  /  Fax: [phone]  WEIGHT SUMMARY AND BIOMETRICS  Vitals Temp: 98.2 F (36.8 C) BP: 98/63 Pulse Rate: 77 SpO2: 97 %   Anthropometric Measurements Height: 5\' 6"  (1.676 m) Weight: 199 lb (90.3 kg) BMI (Calculated): 32.13 Weight at Last Visit: 205 lb Weight Lost Since Last Visit: 6 lb Weight Gained Since Last Visit: 0 Starting Weight: 213 lb Total Weight Loss (lbs): 14 lb (6.35 kg) Peak Weight: 229 lb   Body Composition  Body Fat %: 40.6 % Fat Mass (lbs): 80.8 lbs Muscle Mass (lbs): 112.4 lbs Total Body Water (lbs): 73.4 lbs Visceral Fat Rating : 11   Other Clinical Data Fasting: no Labs: no Today's Visit #: 5 Starting Date: 02/01/23     HPI  Chief Complaint: OBESITY  Zakyia is here to discuss her progress with her obesity treatment plan. She is on the the Category 2 Plan and keeping a food journal and adhering to recommended goals of [phone] calories and 85 grams of protein and states she is following her eating plan approximately 75 % of the time. She states she is exercising 30-35 minutes 2 times per week.   Interval History:  Since last office visit she down 6 lbs Good adherence to nutrition plan overall.  Was using Lose It app to journal, but struggling more with journaling lately and we discussed journaling at least 3 days a week to keep on track with protein and calorie goals.   Hunger/appetite-moderate control Cravings- Significant decrease in cravings with Zepbound therapy Stress- Manageable Sleep- improved sleep quality Exercise-working on increasing walking and discussed adding in some ankle weights while walking Hydration-4-5 20 oz servings daily. Protein- adequate  Has AWV with PCP at end of month.  Vacation planned for next week and we discussed travel strategies.   Pharmacotherapy: Zepbound 5 mg weekly prescribed by PCP. Denies mass in neck, dysphagia, dyspepsia, persistent hoarseness, abdominal pain, or  N/V/Constipation or diarrhea. Has annual eye exam. Mood is stable.    TREATMENT PLAN FOR OBESITY:  Recommended Dietary Goals  Kalissa is currently in the action stage of change. As such, her goal is to continue weight management plan. She has agreed to the Category 2 Plan and keeping a food journal and adhering to recommended goals of 1100-1200 calories and 85+ grams  of protein.  Behavioral Intervention  We discussed the following Behavioral Modification Strategies today: increasing lean protein intake, decreasing simple carbohydrates , increasing vegetables, increasing lower glycemic fruits, increasing fiber rich foods, avoiding skipping meals, increasing water intake, continue to practice mindfulness when eating, planning for success, and staying on track while traveling and vacationing.  Additional resources provided today: NA  Recommended Physical Activity Goals  Rhylynn has been advised to work up to 150 minutes of moderate intensity aerobic activity a week and strengthening exercises 2-3 times per week for cardiovascular health, weight loss maintenance and preservation of muscle mass.   She has agreed to Continue current level of physical activity  and add ankle weights to walking   Pharmacotherapy We discussed various medication options to help Veronique with her weight loss efforts and we both agreed to continue Zepbound 5 mg weekly to promote weight loss and continue to work on nutritional and behavioral strategies to promote weight loss.      Return in about 2 weeks (around 04/26/2023).Marland Kitchen She was informed of the importance of frequent follow up visits to maximize her success with intensive lifestyle modifications for her multiple health conditions.  PHYSICAL EXAM:  Blood pressure 98/63, pulse 77, temperature 98.2 F (36.8 C), height 5\' 6"  (1.676 m), weight 199 lb (90.3 kg), last menstrual period 11/04/2011, SpO2 97 %. Body mass index is 32.12 kg/m.  General: She is overweight,  cooperative, alert, well developed, and in no acute distress. PSYCH: Has normal mood, affect and thought process.   Cardiovascular: HR 70's BP 98/63 which is typical for the patient Lungs: Normal breathing effort, no conversational dyspnea. Neuro: no focal deficits  DIAGNOSTIC DATA REVIEWED:  BMET    Component Value Date/Time   NA 142 02/01/2023 0903   K 4.3 02/01/2023 0903   CL 103 02/01/2023 0903   CO2 23 02/01/2023 0903   GLUCOSE 80 02/01/2023 0903   GLUCOSE 94 10/26/2018 1300   BUN 16 02/01/2023 0903   CREATININE 0.70 02/01/2023 0903   CALCIUM 9.3 02/01/2023 0903   GFRNONAA 91 08/14/2020 0815   GFRAA 105 08/14/2020 0815   Lab Results  Component Value Date   HGBA1C 5.0 02/01/2023   HGBA1C 5.6 03/06/2014   Lab Results  Component Value Date   INSULIN 21.5 02/01/2023   Lab Results  Component Value Date   TSH 0.807 02/01/2023   CBC    Component Value Date/Time   WBC 4.7 02/01/2023 0903   WBC 6.1 10/26/2018 1300   RBC 4.65 02/01/2023 0903   RBC 4.40 10/26/2018 1300   HGB 13.7 02/01/2023 0903   HCT 40.6 02/01/2023 0903   PLT 201 02/01/2023 0903   MCV 87 02/01/2023 0903   MCH 29.5 02/01/2023 0903   MCH 29.8 10/26/2018 1300   MCHC 33.7 02/01/2023 0903   MCHC 32.2 10/26/2018 1300   RDW 13.0 02/01/2023 0903   Iron Studies No results found for: "IRON", "TIBC", "FERRITIN", "IRONPCTSAT" Lipid Panel     Component Value Date/Time   CHOL 119 02/01/2023 0903   TRIG 91 02/01/2023 0903   HDL 45 02/01/2023 0903   CHOLHDL 2.6 02/01/2023 0903   CHOLHDL 4.2 11/29/2014 0902   VLDL 27 11/29/2014 0902   LDLCALC 56 02/01/2023 0903   Hepatic Function Panel     Component Value Date/Time   PROT 6.7 02/01/2023 0903   ALBUMIN 4.7 02/01/2023 0903   AST 22 02/01/2023 0903   ALT 30 02/01/2023 0903   ALKPHOS 69 02/01/2023 0903   BILITOT 0.3 02/01/2023 0903   BILIDIR 0.10 11/01/2022 0843   IBILI NOT CALCULATED 10/26/2018 1300      Component Value Date/Time   TSH 0.807  02/01/2023 0903   Nutritional Lab Results  Component Value Date   VD25OH 37.7 02/01/2023   VD25OH 66.9 02/14/2020    ASSOCIATED CONDITIONS ADDRESSED TODAY  ASSESSMENT AND PLAN  Problem List Items Addressed This Visit     Mixed hyperlipidemia   Other Visit Diagnoses     Insulin resistance    -  Primary   Vitamin D deficiency       Relevant Medications   Vitamin D, Ergocalciferol, (DRISDOL) 1.25 MG (50000 UNIT) CAPS capsule      Insulin Resistance Last fasting insulin was 21.5- not at goal. A1c was 5.0. Polyphagia:No Medication(s): Zepbound 5.0 mg SQ weekly Denies mass in neck, dysphagia, dyspepsia, persistent hoarseness, abdominal pain, or N/V/Constipation or diarrhea. Has annual eye exam. Mood is stable.  She is working  on Engineer, technical sales to decrease simple carbohydrates, increase lean proteins and exercise to promote weight loss, improve glycemic control/insulin resistance and prevent progression to Type 2 diabetes.   Lab Results  Component  Value Date   HGBA1C 5.0 02/01/2023   HGBA1C 5.6 03/06/2014   Lab Results  Component Value Date   INSULIN 21.5 02/01/2023    Plan: Continue Zepbound 5.0 mg SQ weekly prescribed by PCP. Has AWV with PCP later this month.  Continue working on nutrition plan to decrease simple carbohydrates, increase lean proteins and exercise to promote weight loss, improve glycemic control/insulin resistance and prevent progression to Type 2 diabetes.   Hyperlipidemia LDL is at goal. Medication(s): Crestor 10 mg daily. No side effects with Crestor.  Cardiovascular risk factors: dyslipidemia and obesity (BMI >= 30 kg/m2)  Lab Results  Component Value Date   CHOL 119 02/01/2023   HDL 45 02/01/2023   LDLCALC 56 02/01/2023   TRIG 91 02/01/2023   CHOLHDL 2.6 02/01/2023   CHOLHDL 4.3 11/01/2022   CHOLHDL 3.7 04/26/2022   Lab Results  Component Value Date   ALT 30 02/01/2023   AST 22 02/01/2023   ALKPHOS 69 02/01/2023   BILITOT 0.3  02/01/2023   The ASCVD Risk score (Arnett DK, et al., 2019) failed to calculate for the following reasons:   The valid total cholesterol range is 130 to 320 mg/dL  Plan: Continue statin. Continue to work on nutrition plan -decreasing simple carbohydrates, increasing lean proteins, decreasing saturated fats and cholesterol , avoiding trans fats and exercise as able to promote weight loss, improve lipids and decrease cardiovascular risks.    Vitamin D Deficiency Vitamin D is not at goal of 50.  Most recent vitamin D level was 37.7. She is on  prescription ergocalciferol 50,000 IU weekly. No N/V or muscle weakness or other side effects with Ergocalciferol.  Lab Results  Component Value Date   VD25OH 37.7 02/01/2023   VD25OH 66.9 02/14/2020    Plan: Continue and refill  prescription ergocalciferol 50,000 IU weekly Low vitamin D levels can be associated with adiposity and may result in leptin resistance and weight gain. Also associated with fatigue. Currently on vitamin D supplementation without any adverse effects.  Recheck Vitamin D level every 3-4 months to optimize therapy.   ATTESTASTION STATEMENTS:  Reviewed by clinician on day of visit: allergies, medications, problem list, medical history, surgical history, family history, social history, and previous encounter notes.   I have personally spent 30 minutes total time today in preparation, patient care, nutritional counseling and documentation for this visit, including the following: review of clinical lab tests; review of medical tests/procedures/services.      Raysean Graumann, PA-C

## 2023-04-26 ENCOUNTER — Ambulatory Visit (INDEPENDENT_AMBULATORY_CARE_PROVIDER_SITE_OTHER): Payer: BC Managed Care – PPO | Admitting: Family Medicine

## 2023-05-03 DIAGNOSIS — E079 Disorder of thyroid, unspecified: Secondary | ICD-10-CM | POA: Diagnosis not present

## 2023-05-03 DIAGNOSIS — E349 Endocrine disorder, unspecified: Secondary | ICD-10-CM | POA: Diagnosis not present

## 2023-05-03 DIAGNOSIS — E569 Vitamin deficiency, unspecified: Secondary | ICD-10-CM | POA: Diagnosis not present

## 2023-05-03 DIAGNOSIS — Z139 Encounter for screening, unspecified: Secondary | ICD-10-CM | POA: Diagnosis not present

## 2023-05-04 DIAGNOSIS — R5382 Chronic fatigue, unspecified: Secondary | ICD-10-CM | POA: Diagnosis not present

## 2023-05-04 DIAGNOSIS — R5383 Other fatigue: Secondary | ICD-10-CM | POA: Diagnosis not present

## 2023-05-04 DIAGNOSIS — R799 Abnormal finding of blood chemistry, unspecified: Secondary | ICD-10-CM | POA: Diagnosis not present

## 2023-05-04 DIAGNOSIS — E559 Vitamin D deficiency, unspecified: Secondary | ICD-10-CM | POA: Diagnosis not present

## 2023-05-04 DIAGNOSIS — E039 Hypothyroidism, unspecified: Secondary | ICD-10-CM | POA: Diagnosis not present

## 2023-05-05 ENCOUNTER — Encounter: Payer: BC Managed Care – PPO | Admitting: Nurse Practitioner

## 2023-05-08 ENCOUNTER — Ambulatory Visit (HOSPITAL_COMMUNITY)
Admission: RE | Admit: 2023-05-08 | Discharge: 2023-05-08 | Disposition: A | Discharge status: Home / Self Care | Payer: BC Managed Care – PPO | Source: Ambulatory Visit | Attending: Nurse Practitioner | Admitting: Nurse Practitioner

## 2023-05-08 ENCOUNTER — Encounter (HOSPITAL_COMMUNITY): Payer: Self-pay

## 2023-05-08 DIAGNOSIS — Z1231 Encounter for screening mammogram for malignant neoplasm of breast: Secondary | ICD-10-CM | POA: Insufficient documentation

## 2023-05-12 ENCOUNTER — Encounter: Payer: Self-pay | Admitting: Nurse Practitioner

## 2023-05-12 ENCOUNTER — Ambulatory Visit: Payer: BC Managed Care – PPO | Admitting: Nurse Practitioner

## 2023-05-12 VITALS — BP 113/74 | HR 82 | Ht 64.5 in | Wt 200.4 lb

## 2023-05-12 DIAGNOSIS — Z01419 Encounter for gynecological examination (general) (routine) without abnormal findings: Secondary | ICD-10-CM | POA: Diagnosis not present

## 2023-05-12 NOTE — Patient Instructions (Signed)
[place]  ?

## 2023-05-12 NOTE — Progress Notes (Unsigned)
Subjective:    Patient ID: Nancy Robertson, female    DOB: [birth year], 57 y.o.   MRN: [id number]  HPI The patient comes in today for a wellness visit.    A review of their health history was completed.  A review of medications was also completed.  Any needed refills; no  Eating habits: eating healthy  Falls/  MVA accidents in past few months: no  Regular exercise: stays active; nothing routine  Specialist pt sees on regular basis: no  Preventative health issues were discussed.   Additional concerns: no concerns or issues   Followed by [facility] Started Zepbound 7.5 mg yesterday. Denies any adverse effects. Has had vaginal hysterectomy with BSO. Married, same female sexual partner. Colonoscopy and Mammogram UTD. Regular vision and dental exams.  Vaccines UTD.    Review of Systems  Constitutional:  Negative for activity change, appetite change and fatigue.  HENT:  Negative for sore throat and trouble swallowing.   Respiratory:  Negative for cough, chest tightness, shortness of breath and wheezing.   Cardiovascular:  Negative for chest pain.  Gastrointestinal:  Negative for abdominal distention, abdominal pain, constipation, diarrhea, nausea and vomiting.  Genitourinary:  Negative for difficulty urinating, dysuria, enuresis, frequency, genital sores, pelvic pain, urgency and vaginal discharge.       Mild vaginal dryness.       05/12/2023    9:48 AM  Depression screen PHQ 2/9  Decreased Interest 0  Down, Depressed, Hopeless 0  PHQ - 2 Score 0  Altered sleeping 0  Tired, decreased energy 0  Change in appetite 0  Feeling bad or failure about yourself  0  Trouble concentrating 0  Moving slowly or fidgety/restless 0  Suicidal thoughts 0  PHQ-9 Score 0      05/12/2023    9:48 AM 01/13/2023    4:30 PM 12/09/2022    8:29 AM 11/04/2022   10:28 AM  GAD 7 : Generalized Anxiety Score  Nervous, Anxious, on Edge 0 0 1 3  Control/stop worrying 0 2 0 2   Worry too much - different things 0 0 0 2  Trouble relaxing 0 0 0 1  Restless 0 0 0 1  Easily annoyed or irritable 0 2 0 3  Afraid - awful might happen 0 0 0 1  Total GAD 7 Score 0 4 1 13   Anxiety Difficulty  Not difficult at all Not difficult at all Somewhat difficult         Objective:   Physical Exam Constitutional:      General: She is not in acute distress. Neck:     Thyroid: No thyromegaly.     Trachea: No tracheal deviation.     Comments: Thyroid non tender to palpation. No mass or goiter noted.  Cardiovascular:     Rate and Rhythm: Normal rate and regular rhythm.     Heart sounds: Normal heart sounds. No murmur heard. Pulmonary:     Effort: Pulmonary effort is normal.     Breath sounds: Normal breath sounds.  Chest:  Breasts:    Right: No swelling, inverted nipple, mass, skin change or tenderness.     Left: No swelling, inverted nipple, mass, skin change or tenderness.  Abdominal:     General: There is no distension.     Palpations: Abdomen is soft.     Tenderness: There is no abdominal tenderness.  Genitourinary:    General: Normal vulva.     Vagina: No vaginal  discharge.     Comments: Bimanual exam: normal.  Musculoskeletal:     Cervical back: Normal range of motion and neck supple.  Lymphadenopathy:     Cervical: No cervical adenopathy.     Upper Body:     Right upper body: No supraclavicular, axillary or pectoral adenopathy.     Left upper body: No supraclavicular, axillary or pectoral adenopathy.  Skin:    General: Skin is warm and dry.  Neurological:     Mental Status: She is alert and oriented to person, place, and time.  Psychiatric:        Mood and Affect: Mood normal.        Behavior: Behavior normal.        Thought Content: Thought content normal.        Judgment: Judgment normal.   Today's Vitals   05/12/23 0939  BP: 113/74  Pulse: 82  SpO2: 95%  Weight: 200 lb 6.4 oz (90.9 kg)  Height: 5' 4.5" (1.638 m)   Body mass index is 33.87  kg/m.  Results for orders placed or performed in visit on 02/01/23  CBC with Differential/Platelet  Result Value Ref Range   WBC 4.7 3.4 - 10.8 x10E3/uL   RBC 4.65 3.77 - 5.28 x10E6/uL   Hemoglobin 13.7 11.1 - 15.9 g/dL   Hematocrit 16.1 09.6 - 46.6 %   MCV 87 79 - 97 fL   MCH 29.5 26.6 - 33.0 pg   MCHC 33.7 31.5 - 35.7 g/dL   RDW 04.5 40.9 - 81.1 %   Platelets 201 150 - 450 x10E3/uL   Neutrophils 56 Not Estab. %   Lymphs 34 Not Estab. %   Monocytes 6 Not Estab. %   Eos 3 Not Estab. %   Basos 1 Not Estab. %   Neutrophils Absolute 2.6 1.4 - 7.0 x10E3/uL   Lymphocytes Absolute 1.6 0.7 - 3.1 x10E3/uL   Monocytes Absolute 0.3 0.1 - 0.9 x10E3/uL   EOS (ABSOLUTE) 0.1 0.0 - 0.4 x10E3/uL   Basophils Absolute 0.0 0.0 - 0.2 x10E3/uL   Immature Granulocytes 0 Not Estab. %   Immature Grans (Abs) 0.0 0.0 - 0.1 x10E3/uL  Comprehensive metabolic panel  Result Value Ref Range   Glucose 80 70 - 99 mg/dL   BUN 16 6 - 24 mg/dL   Creatinine, Ser 9.14 0.57 - 1.00 mg/dL   eGFR 782 >95 AO/ZHY/8.65   BUN/Creatinine Ratio 23 9 - 23   Sodium 142 134 - 144 mmol/L   Potassium 4.3 3.5 - 5.2 mmol/L   Chloride 103 96 - 106 mmol/L   CO2 23 20 - 29 mmol/L   Calcium 9.3 8.7 - 10.2 mg/dL   Total Protein 6.7 6.0 - 8.5 g/dL   Albumin 4.7 3.8 - 4.9 g/dL   Globulin, Total 2.0 1.5 - 4.5 g/dL   Albumin/Globulin Ratio 2.4 (H) 1.2 - 2.2   Bilirubin Total 0.3 0.0 - 1.2 mg/dL   Alkaline Phosphatase 69 44 - 121 IU/L   AST 22 0 - 40 IU/L   ALT 30 0 - 32 IU/L  Hemoglobin A1c  Result Value Ref Range   Hgb A1c MFr Bld 5.0 4.8 - 5.6 %   Est. average glucose Bld gHb Est-mCnc 97 mg/dL  Insulin, random  Result Value Ref Range   INSULIN 21.5 2.6 - 24.9 uIU/mL  Lipid panel  Result Value Ref Range   Cholesterol, Total 119 100 - 199 mg/dL   Triglycerides 91 0 - 149 mg/dL  HDL 45 >39 mg/dL   VLDL Cholesterol Cal 18 5 - 40 mg/dL   LDL Chol Calc (NIH) 56 0 - 99 mg/dL   Chol/HDL Ratio 2.6 0.0 - 4.4 ratio  T4, free   Result Value Ref Range   Free T4 1.49 0.82 - 1.77 ng/dL  TSH  Result Value Ref Range   TSH 0.807 0.450 - 4.500 uIU/mL  VITAMIN D 25 Hydroxy (Vit-D Deficiency, Fractures)  Result Value Ref Range   Vit D, 25-Hydroxy 37.7 30.0 - 100.0 ng/mL  Vitamin B12  Result Value Ref Range   Vitamin B-12 561 232 - 1,245 pg/mL        Assessment & Plan:  Well woman exam  Follow up with weight loss clinic as planned.  Encouraged increased activity.  Continue current meds as directed. Recommend flu vaccine this fall.  Return in about 1 year (around 05/11/2024) for physical.

## 2023-05-12 NOTE — Telephone Encounter (Signed)
See dermatology reports

## 2023-05-14 ENCOUNTER — Encounter: Payer: Self-pay | Admitting: Nurse Practitioner

## 2023-05-17 NOTE — Progress Notes (Signed)
This encounter was created in error - please disregard.

## 2023-05-30 ENCOUNTER — Ambulatory Visit (INDEPENDENT_AMBULATORY_CARE_PROVIDER_SITE_OTHER): Payer: BC Managed Care – PPO | Admitting: Physician Assistant

## 2023-05-30 ENCOUNTER — Encounter: Payer: Self-pay | Admitting: Family Medicine

## 2023-06-22 DIAGNOSIS — E349 Endocrine disorder, unspecified: Secondary | ICD-10-CM | POA: Diagnosis not present

## 2023-06-22 DIAGNOSIS — E569 Vitamin deficiency, unspecified: Secondary | ICD-10-CM | POA: Diagnosis not present

## 2023-06-22 DIAGNOSIS — E079 Disorder of thyroid, unspecified: Secondary | ICD-10-CM | POA: Diagnosis not present

## 2023-06-22 DIAGNOSIS — Z139 Encounter for screening, unspecified: Secondary | ICD-10-CM | POA: Diagnosis not present

## 2023-06-30 ENCOUNTER — Encounter: Payer: Self-pay | Admitting: Nurse Practitioner

## 2023-07-01 ENCOUNTER — Other Ambulatory Visit: Payer: Self-pay | Admitting: Nurse Practitioner

## 2023-07-01 MED ORDER — LUBIPROSTONE 8 MCG PO CAPS: 8.0000 ug | ORAL_CAPSULE | Freq: Two times a day (BID) | ORAL | 5 refills | Status: DC

## 2023-07-03 ENCOUNTER — Encounter: Payer: Self-pay | Admitting: Nurse Practitioner

## 2023-07-03 NOTE — Telephone Encounter (Signed)
Her insurance will only cover Linzess Linzess is a similar medication to Amitiza Please touch base with patient make sure we have consent from the patient to make the switch

## 2023-07-17 IMAGING — MR MR [PERSON_NAME]
11 series · 40 of 40 positions shown · non-contrast
Comparison: None.

CLINICAL DATA: Articular disc disorder (reducing or non-reducing)
of temporomandibular joint. Left temporomandibular joint jaw pain
since [DATE].

EXAM:
MRI OF TEMPOROMANDIBULAR JOINT WITHOUT CONTRAST
TECHNIQUE: Multiplanar, multisequence MR imaging of the temporomandibular joint
was performed following the standard protocol. No intravenous
contrast was administered.

[Series 3: T1 · axial · 4.0mm · 0.59mm/px · z∈[-30,+18]mm · 2 of 13 slices shown (1 of 2)]
[im 1/13]
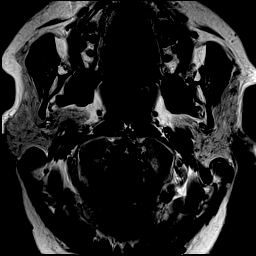
[im 13/13]
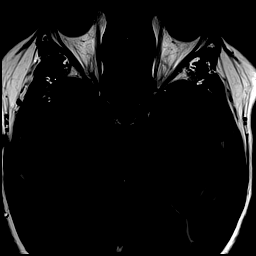

[Series 4: PD · coronal · 4.0mm · 0.44mm/px · 4 of 18 slices shown (1 of 8)]
[im 1/18]
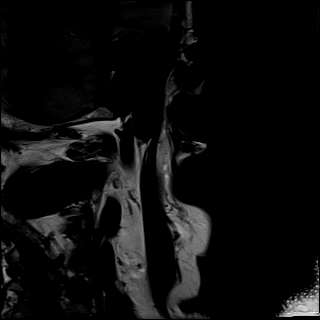
[im 6/18]
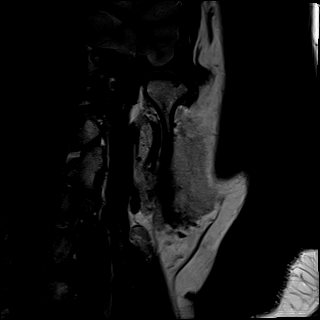
[im 12/18]
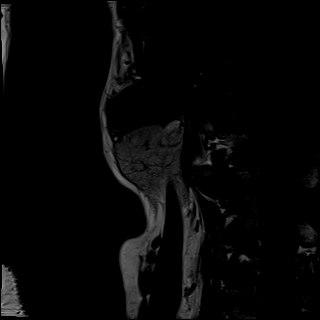
[im 18/18]
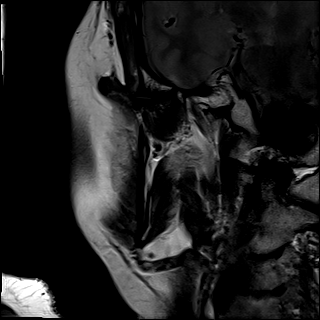

[Series 5: T2 fat-sat · sagittal · 4.0mm · 0.62mm/px · 4 of 18 slices shown]
[im 1/18]
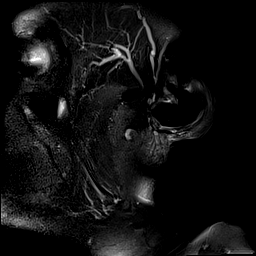
[im 6/18]
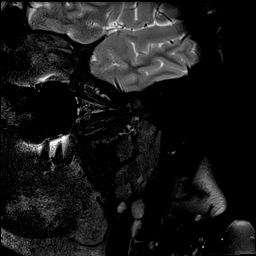
[im 12/18]
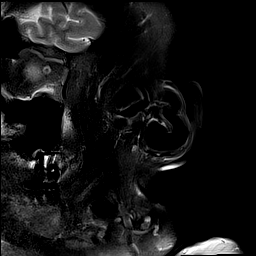
[im 18/18]
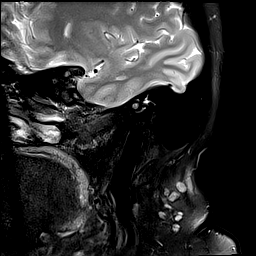

[Series 6: PD · sagittal · 4.0mm · 0.44mm/px · 4 of 18 slices shown (2 of 8)]
[im 1/18]
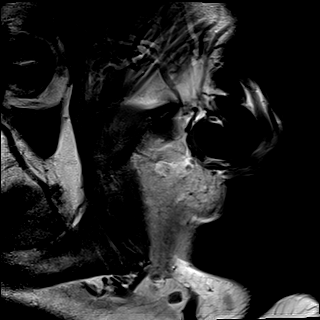
[im 6/18]
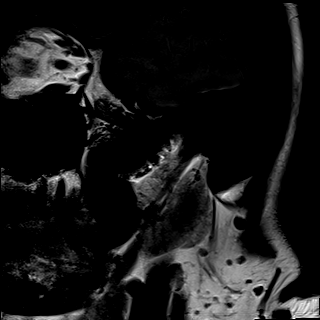
[im 12/18]
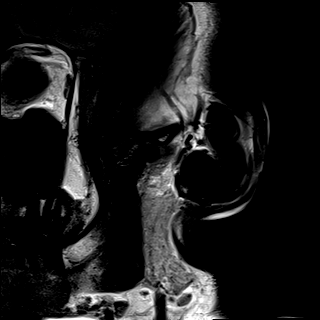
[im 18/18]
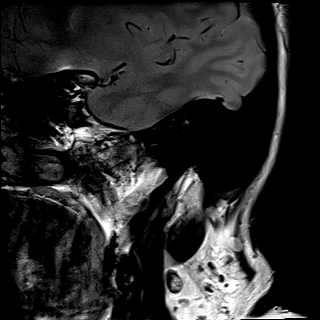

[Series 7: T1 · axial · 4.0mm · 0.31mm/px · z∈[-24,+16]mm · 2 of 10 slices shown (2 of 2)]
[im 1/10]
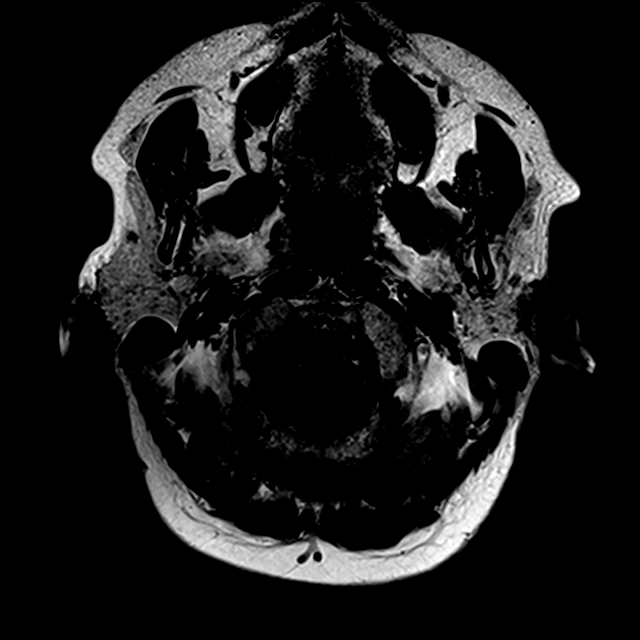
[im 10/10]
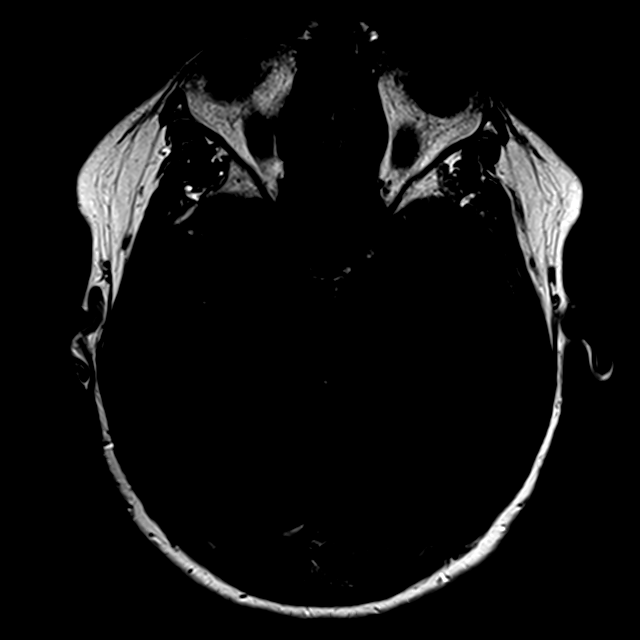

[Series 8: PD · sagittal · 4.0mm · 0.44mm/px · 4 of 18 slices shown (3 of 8)]
[im 1/18]
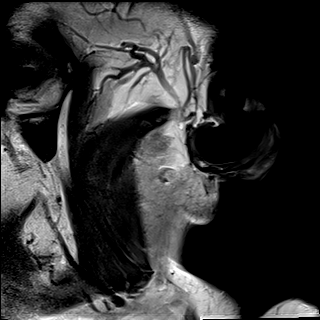
[im 6/18]
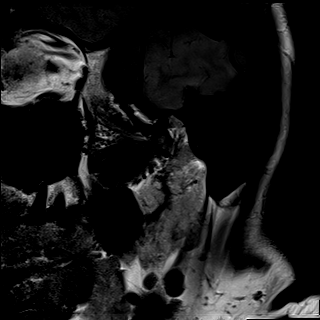
[im 12/18]
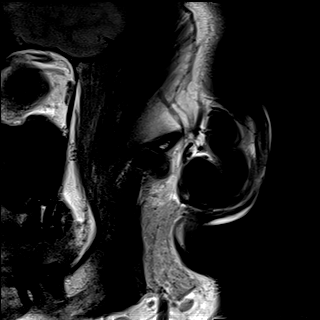
[im 18/18]
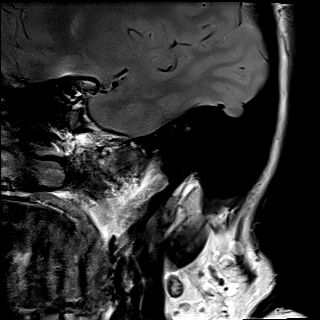

[Series 9: PD · sagittal · 4.0mm · 0.44mm/px · 4 of 18 slices shown (4 of 8)]
[im 1/18]
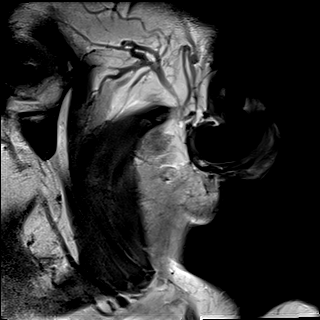
[im 6/18]
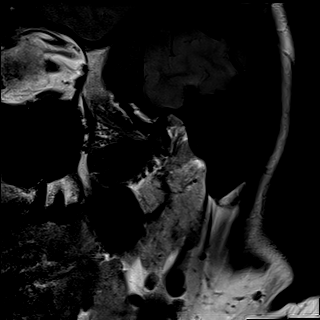
[im 12/18]
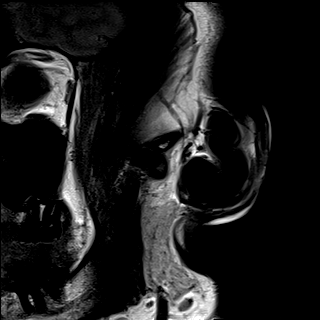
[im 18/18]
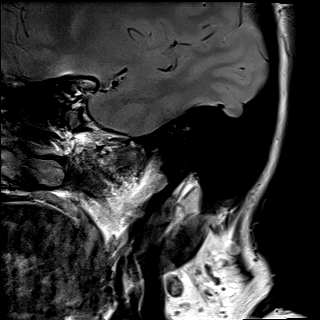

[Series 10: PD · sagittal · 4.0mm · 0.44mm/px · 4 of 18 slices shown (5 of 8)]
[im 1/18]
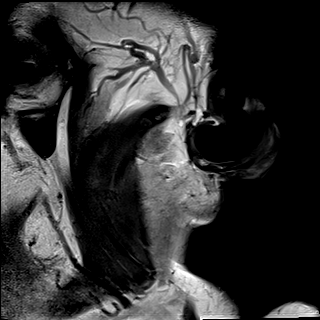
[im 6/18]
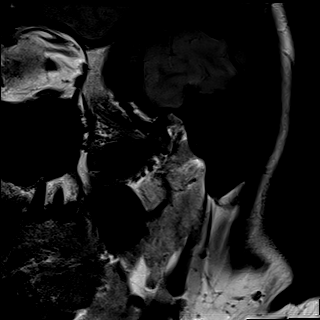
[im 12/18]
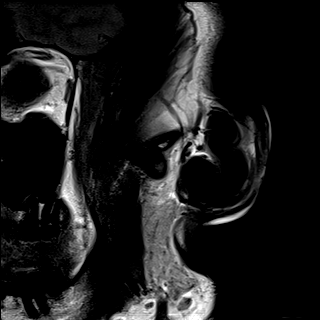
[im 18/18]
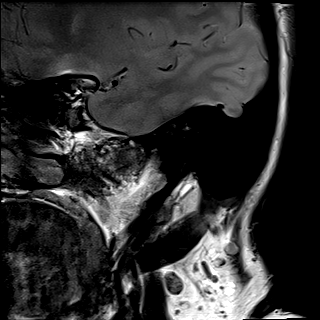

[Series 11: PD · sagittal · 4.0mm · 0.44mm/px · 4 of 18 slices shown (6 of 8)]
[im 1/18]
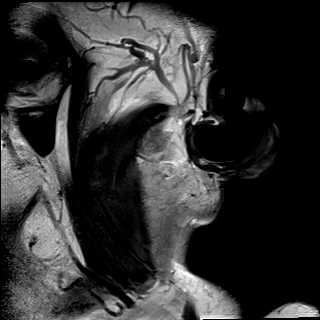
[im 6/18]
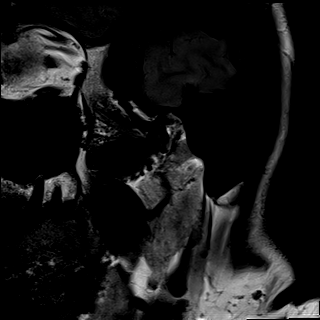
[im 12/18]
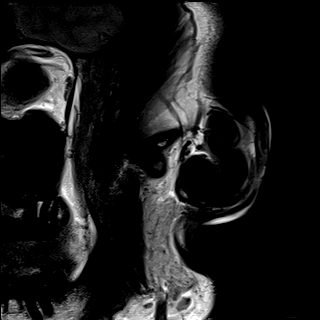
[im 18/18]
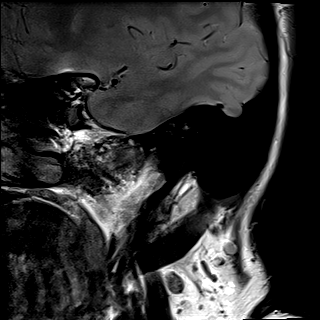

[Series 12: PD · sagittal · 4.0mm · 0.44mm/px · 4 of 18 slices shown (7 of 8)]
[im 1/18]
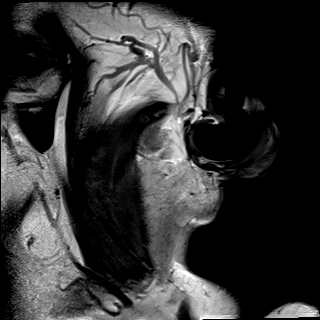
[im 6/18]
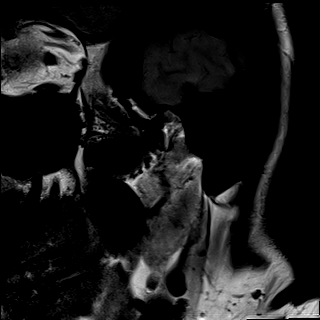
[im 12/18]
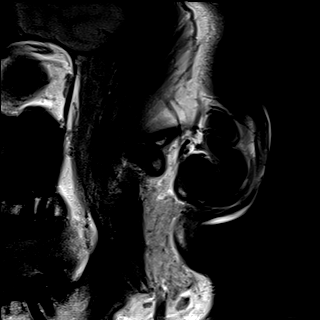
[im 18/18]
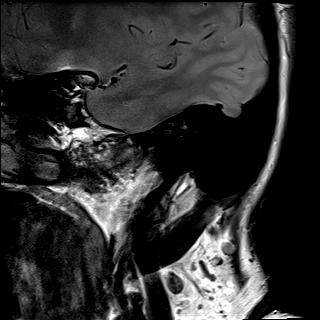

[Series 13: PD · sagittal · 4.0mm · 0.44mm/px · 4 of 18 slices shown (8 of 8)]
[im 1/18]
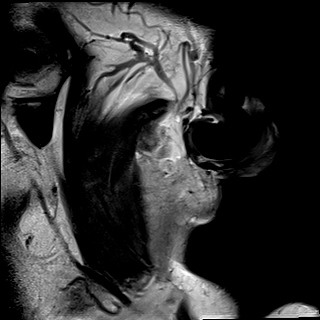
[im 6/18]
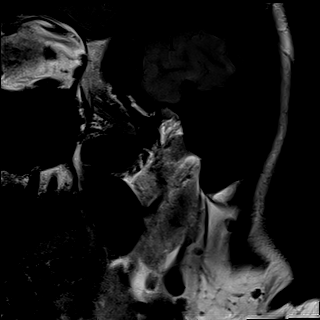
[im 12/18]
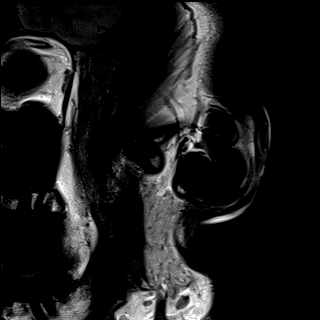
[im 18/18]
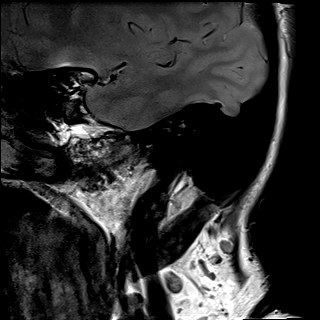

[40 of 40 positions shown; findings below may reference images not displayed]

FINDINGS: Right temporomandibular joint: The articular disc is normally
positioned between the mandibular condyle and the temporal bone in
both open and closed positions. There is normal anterior translation
of the mandibular condyle with jaw opening. There is no joint
effusion.

Left temporomandibular joint: The left articular disc is displaced
anteriorly with normal morphology. There is normal anterior
translation of the mandibular condyle with normal articulation with
the articular eminence of the temporal bone, but the articular disc
remains anteriorly displaced. There is no joint effusion.

Other: No acute fracture or dislocation. No focal marrow signal
abnormality. No aggressive osseous lesion. Visualized brain
demonstrates no focal abnormality. Visualized paranasal sinuses are
clear.
IMPRESSION: 1. Left articular disc is displaced anteriorly with normal
morphology. Normal anterior translation of the left mandibular
condyle with normal articulation with the articular eminence of the
temporal bone, but the articular disc remains anteriorly displaced.
2. No focal abnormality of the right temporomandibular joint.

## 2023-08-04 ENCOUNTER — Other Ambulatory Visit: Payer: Self-pay | Admitting: Family Medicine

## 2023-08-15 DIAGNOSIS — R799 Abnormal finding of blood chemistry, unspecified: Secondary | ICD-10-CM | POA: Diagnosis not present

## 2023-08-15 DIAGNOSIS — R5382 Chronic fatigue, unspecified: Secondary | ICD-10-CM | POA: Diagnosis not present

## 2023-08-23 ENCOUNTER — Encounter: Payer: Self-pay | Admitting: Family Medicine

## 2023-09-04 DIAGNOSIS — D225 Melanocytic nevi of trunk: Secondary | ICD-10-CM | POA: Diagnosis not present

## 2023-09-04 DIAGNOSIS — Z1283 Encounter for screening for malignant neoplasm of skin: Secondary | ICD-10-CM | POA: Diagnosis not present

## 2023-09-04 DIAGNOSIS — L218 Other seborrheic dermatitis: Secondary | ICD-10-CM | POA: Diagnosis not present

## 2023-12-28 ENCOUNTER — Telehealth: Payer: Self-pay | Admitting: Allergy & Immunology

## 2023-12-28 NOTE — Telephone Encounter (Signed)
 Patient called and stated she would be paying out of pocket for her Symbicort and Ryaltris as her insurance will not be able to cover for it.  Patient was calling to see if there was anything Dr.Gallagher can do or send an Alternative.  Best Contact: [phone]

## 2023-12-29 ENCOUNTER — Encounter: Payer: Self-pay | Admitting: Family Medicine

## 2023-12-29 ENCOUNTER — Other Ambulatory Visit: Payer: Self-pay

## 2023-12-29 MED ORDER — BUDESONIDE-FORMOTEROL FUMARATE 160-4.5 MCG/ACT IN AERO: INHALATION_SPRAY | RESPIRATORY_TRACT | 1 refills | Status: DC

## 2024-01-02 MED ORDER — FLUTICASONE PROPIONATE 50 MCG/ACT NA SUSP: 1.0000 | Freq: Two times a day (BID) | NASAL | 5 refills | Status: DC

## 2024-01-02 MED ORDER — AZELASTINE HCL 0.1 % NA SOLN: 1.0000 | Freq: Two times a day (BID) | NASAL | 5 refills | Status: DC

## 2024-01-02 NOTE — Addendum Note (Signed)
 Addended by: Alfonse Spruce on: 01/02/2024 01:35 PM   Modules accepted: Orders

## 2024-01-02 NOTE — Telephone Encounter (Signed)
 Called and notified patient of the note and scheduled her a follow up as she is past due for her follow up.

## 2024-01-02 NOTE — Telephone Encounter (Signed)
 That is insane.  I am printing off a Symbicort co-pay card for her which should bring the cost down to $35.  We can send to the patient or bring to [facility] for her to pick up.  The Ryaltris can be split to Flonase and Astelin.  I sent those to prescription sent in.  Malachi Bonds, MD [facility]

## 2024-01-12 ENCOUNTER — Encounter: Payer: Self-pay | Admitting: Allergy & Immunology

## 2024-01-12 ENCOUNTER — Other Ambulatory Visit: Payer: Self-pay

## 2024-01-12 ENCOUNTER — Ambulatory Visit (INDEPENDENT_AMBULATORY_CARE_PROVIDER_SITE_OTHER): Admitting: Allergy & Immunology

## 2024-01-12 VITALS — HR 80 | Temp 98.3°F | Wt 179.9 lb

## 2024-01-12 DIAGNOSIS — J3089 Other allergic rhinitis: Secondary | ICD-10-CM | POA: Diagnosis not present

## 2024-01-12 DIAGNOSIS — J302 Other seasonal allergic rhinitis: Secondary | ICD-10-CM | POA: Diagnosis not present

## 2024-01-12 DIAGNOSIS — K219 Gastro-esophageal reflux disease without esophagitis: Secondary | ICD-10-CM | POA: Diagnosis not present

## 2024-01-12 DIAGNOSIS — J4540 Moderate persistent asthma, uncomplicated: Secondary | ICD-10-CM

## 2024-01-12 MED ORDER — AZELASTINE-FLUTICASONE 137-50 MCG/ACT NA SUSP: 2.0000 | Freq: Two times a day (BID) | NASAL | 5 refills | Status: AC

## 2024-01-12 NOTE — Progress Notes (Signed)
 FOLLOW UP  Date of Service/Encounter:  01/12/24   Assessment:   Moderate persistent asthma, uncomplicated   Perennial and seasonal allergic rhinitis (indoor molds, dust mites, cat, and dog)  Plan/Recommendations:   Asthma - Lung testing looked great today. - We are not going to make any changes at this time. - Daily controller medication(s): Symbicort 80/4.64mcg two puffs twice daily with spacer during certain times of the year - Prior to physical activity: albuterol 2 puffs 10-15 minutes before physical activity. - Rescue medications: albuterol 4 puffs every 4-6 hours as needed - Asthma control goals:  * Full participation in all desired activities (may need albuterol before activity) * Albuterol use two time or less a week on average (not counting use with activity) * Cough interfering with sleep two time or less a month * Oral steroids no more than once a year * No hospitalizations  Allergic rhinitis (indoor molds, dust mites, cat, and dog) - Continue with Allegra (fexofeandine) once a day as needed for runny nose or itch - Continue with the Astelin (azelastine) two sprays per nostril twice daily as needed.  - I sent in Dymista generic to see if that is covered.   Reflux - Continue dietary and lifestyle modifications as listed below - Continue pantoprazole 40 mg once a day.   Return in about 1 year (around 01/11/2025). You can have the follow up appointment with Dr. Dellis Anes or a Nurse Practicioner (our Nurse Practitioners are excellent and always have Physician oversight!).    Subjective:   Nancy Robertson is a 58 y.o. female presenting today for follow up of  Chief Complaint  Patient presents with   Follow-up    Asthma Allergies     Nancy Robertson has a history of the following: Patient Active Problem List   Diagnosis Date Noted   Obesity (BMI 30.0-34.9) 01/14/2023   Genetic testing 09/21/2022   Family history of CHEK2 gene mutation 08/19/2022   Family  history of breast cancer 08/19/2022   Family history of ovarian cancer 08/19/2022   Sleep apnea 04/29/2022   Lichen sclerosus et atrophicus 04/29/2022   Seasonal allergic conjunctivitis 03/25/2022   Seasonal and perennial allergic rhinitis 09/13/2021   Penicillin allergy 09/13/2021   Food intolerance 09/13/2021   Systolic murmur 08/30/2021   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  Discussed the use of AI scribe software for clinical note transcription with the patient and/or guardian, who gave verbal consent to proceed.  Nancy Robertson is a 58 y.o. female presenting for a follow up visit.  She was last seen in June 2023 by Thermon Leyland, one of our esteemed nurse practitioners.  At that time, she was continued on Symbicort 80 mcg 2 puffs twice daily and albuterol as needed.  For her allergic rhinitis, she continued on cetirizine and myalgias.  She also continued on Pataday.  Reflux was controlled with pantoprazole 40 mg daily.  Since last visit, she has done well.   Asthma/Respiratory Symptom History: She has been using the Symbicort BID in the spring and the fall.  She does fine in the winter and the summer. She has been using Symbicort regularly, with doses in the morning and evening, particularly starting around February. She has not needed to use her albuterol inhaler since an episode in 2022 or 2023, indicating good control of her asthma symptoms. No emergency room visits or urgent care needs related to asthma recently. Quenisha's  asthma has been well controlled. She has not required rescue medication, experienced nocturnal awakenings due to lower respiratory symptoms, nor have activities of daily living been limited. She has required no Emergency Department or Urgent Care visits for her asthma. She has required zero courses of systemic steroids for asthma exacerbations since the last visit. ACT score today is 25, indicating excellent asthma symptom control.   Allergic Rhinitis Symptom History: She discusses her experience with various medications, including Breyna, which she obtained at a cost of $115. She also uses Flonase and azelastine, although she finds the taste of azelastine unpleasant. She has switched to Allegra instead of Zyrtec and occasionally takes Mucinex. She has not been on antibiotics at all for any sinus or ear infections.   GERD Symptom History: She remains on Protonix daily for her GERD.  She has experienced significant weight loss, which she attributes to the use of ZepBound. She has noticed improvements in her overall health, including reduced inflammation. She is currently on pantoprazole and reports that her heart is fine with no issues.  Otherwise, there have been no changes to her past medical history, surgical history, family history, or social history.    Review of systems otherwise negative other than that mentioned in the HPI.    Objective:   Pulse 80, temperature 98.3 F (36.8 C), temperature source Temporal, weight 179 lb 14.3 oz (81.6 kg), last menstrual period 11/04/2011, SpO2 96%. Body mass index is 30.4 kg/m.    Physical Exam Vitals reviewed.  Constitutional:      Appearance: She is well-developed.     Comments: Pleasant. Cooperative with the exam. Smiling.   HENT:     Head: Normocephalic and atraumatic.     Right Ear: Tympanic membrane, ear canal and external ear normal. No drainage, swelling or tenderness. Tympanic membrane is not injected, scarred, erythematous, retracted  or bulging.     Left Ear: Tympanic membrane, ear canal and external ear normal. No drainage, swelling or tenderness. Tympanic membrane is not injected, scarred, erythematous, retracted or bulging.     Nose: Mucosal edema and rhinorrhea present. No nasal deformity or septal deviation.     Right Turbinates: Enlarged, swollen and pale.     Left Turbinates: Enlarged, swollen and pale.     Right Sinus: No maxillary sinus tenderness or frontal sinus tenderness.     Left Sinus: No maxillary sinus tenderness or frontal sinus tenderness.     Comments: No polyps noted.     Mouth/Throat:     Lips: Pink.     Mouth: Mucous membranes are moist. Mucous membranes are not pale and not dry.     Pharynx: Uvula midline.     Comments: Minimal cobblestoning.  Eyes:     General: Lids are normal. Allergic shiner present.        Right eye: No discharge.        Left eye: No discharge.     Conjunctiva/sclera: Conjunctivae normal.     Right eye: Right conjunctiva is not injected. No chemosis.    Left eye: Left conjunctiva is not injected. No chemosis.    Pupils: Pupils are  equal, round, and reactive to light.  Cardiovascular:     Rate and Rhythm: Normal rate and regular rhythm.     Heart sounds: Normal heart sounds.  Pulmonary:     Effort: Pulmonary effort is normal. No tachypnea, accessory muscle usage or respiratory distress.     Breath sounds: Normal breath sounds. No wheezing, rhonchi or rales.     Comments: Moving air well in all lung fields.  No increased work of breathing. Chest:     Chest wall: No tenderness.  Abdominal:     Tenderness: There is no abdominal tenderness. There is no guarding or rebound.  Lymphadenopathy:     Head:     Right side of head: No submandibular, tonsillar or occipital adenopathy.     Left side of head: No submandibular, tonsillar or occipital adenopathy.     Cervical: No cervical adenopathy.  Skin:    General: Skin is warm.     Capillary Refill: Capillary refill takes  less than 2 seconds.     Coloration: Skin is not pale.     Findings: No abrasion, erythema, petechiae or rash. Rash is not papular, urticarial or vesicular.     Comments: No eczematous or urticarial lesions noted.  Neurological:     Mental Status: She is alert.  Psychiatric:        Behavior: Behavior is cooperative.      Diagnostic studies:    Spirometry: results normal (FEV1: 2.65/95%, FVC: 3.38/95%, FEV1/FVC: 78%).    Spirometry consistent with normal pattern.   Allergy Studies: none       Malachi Bonds, MD  [facility]

## 2024-01-12 NOTE — Patient Instructions (Addendum)
 Asthma - Lung testing looked great today. - We are not going to make any changes at this time. - Daily controller medication(s): Symbicort 80/4.59mcg two puffs twice daily with spacer during certain times of the year - Prior to physical activity: albuterol 2 puffs 10-15 minutes before physical activity. - Rescue medications: albuterol 4 puffs every 4-6 hours as needed - Asthma control goals:  * Full participation in all desired activities (may need albuterol before activity) * Albuterol use two time or less a week on average (not counting use with activity) * Cough interfering with sleep two time or less a month * Oral steroids no more than once a year * No hospitalizations  Allergic rhinitis (indoor molds, dust mites, cat, and dog) - Continue with Allegra (fexofeandine) once a day as needed for runny nose or itch - Continue with the Astelin (azelastine) two sprays per nostril twice daily as needed.  - I sent in Dymista generic to see if that is covered.   Reflux - Continue dietary and lifestyle modifications as listed below - Continue pantoprazole 40 mg once a day.   Return in about 1 year (around 01/11/2025). You can have the follow up appointment with Dr. Dellis Anes or a Nurse Practicioner (our Nurse Practitioners are excellent and always have Physician oversight!).    Please inform us of any Emergency Department visits, hospitalizations, or changes in symptoms. Call us before going to the ED for breathing or allergy symptoms since we might be able to fit you in for a sick visit. Feel free to contact us anytime with any questions, problems, or concerns.  It was a pleasure to meet you today!  Websites that have reliable patient information: 1. American Academy of Asthma, Allergy, and Immunology: www.aaaai.org 2. Food Allergy Research and Education (FARE): foodallergy.org 3. Mothers of Asthmatics: http://www.asthmacommunitynetwork.org 4. American College of Allergy, Asthma, and  Immunology: www.acaai.org      "Like" [place] on Facebook and Instagram for our latest updates!      A healthy democracy works best when [employer] participate! Make sure you are registered to vote! If you have moved or changed any of your contact information, you will need to get this updated before voting! Scan the QR codes below to learn more!

## 2024-01-15 NOTE — Addendum Note (Signed)
 Addended by: Elsworth Soho on: 01/15/2024 04:39 PM   Modules accepted: Orders

## 2024-01-19 ENCOUNTER — Other Ambulatory Visit: Payer: Self-pay | Admitting: Family Medicine

## 2024-01-22 ENCOUNTER — Other Ambulatory Visit: Payer: Self-pay

## 2024-01-22 MED ORDER — ROSUVASTATIN CALCIUM 10 MG PO TABS: 10.0000 mg | ORAL_TABLET | Freq: Every day | ORAL | 1 refills | Status: DC

## 2024-02-23 ENCOUNTER — Encounter: Payer: Self-pay | Admitting: Nurse Practitioner

## 2024-03-08 NOTE — Telephone Encounter (Signed)
 This was handled through patient's chart

## 2024-04-06 ENCOUNTER — Encounter: Payer: Self-pay | Admitting: Family Medicine

## 2024-04-06 ENCOUNTER — Other Ambulatory Visit: Payer: Self-pay | Admitting: Family Medicine

## 2024-05-02 ENCOUNTER — Encounter: Payer: Self-pay | Admitting: Nurse Practitioner

## 2024-05-03 ENCOUNTER — Other Ambulatory Visit: Payer: Self-pay

## 2024-05-03 DIAGNOSIS — R809 Proteinuria, unspecified: Secondary | ICD-10-CM

## 2024-05-03 NOTE — Telephone Encounter (Signed)
 Nurses Patient sent labs for Nancy Robertson to review I am covering for Nancy Robertson today I reviewed over the labs overall they look good she does not need to do any additional blood work  Please order urine ACR patient can do this at her convenience through [facility]-the purpose of the urine ACR is because her urine dipstick protein was slightly elevated-what she submitted for us  to review.  Therefore if she can do the urine ACR before her follow-up visit with Nancy Robertson that would be great-thanks-Dr. Glendia

## 2024-05-09 DIAGNOSIS — R809 Proteinuria, unspecified: Secondary | ICD-10-CM | POA: Diagnosis not present

## 2024-05-10 ENCOUNTER — Ambulatory Visit: Payer: Self-pay | Admitting: Family Medicine

## 2024-05-10 LAB — MICROALBUMIN / CREATININE URINE RATIO
Creatinine, Urine: 105.5 mg/dL
Microalb/Creat Ratio: 4 mg/g{creat} (ref 0–29)
Microalbumin, Urine: 4.2 ug/mL

## 2024-05-13 ENCOUNTER — Other Ambulatory Visit: Payer: Self-pay | Admitting: Nurse Practitioner

## 2024-05-13 ENCOUNTER — Ambulatory Visit: Admitting: Nurse Practitioner

## 2024-05-13 ENCOUNTER — Encounter: Payer: Self-pay | Admitting: Nurse Practitioner

## 2024-05-13 VITALS — BP 99/61 | Temp 98.4°F | Ht 64.5 in | Wt 168.0 lb

## 2024-05-13 DIAGNOSIS — L900 Lichen sclerosus et atrophicus: Secondary | ICD-10-CM | POA: Diagnosis not present

## 2024-05-13 DIAGNOSIS — Z01411 Encounter for gynecological examination (general) (routine) with abnormal findings: Secondary | ICD-10-CM | POA: Diagnosis not present

## 2024-05-13 DIAGNOSIS — Z01419 Encounter for gynecological examination (general) (routine) without abnormal findings: Secondary | ICD-10-CM

## 2024-05-13 DIAGNOSIS — Z23 Encounter for immunization: Secondary | ICD-10-CM | POA: Diagnosis not present

## 2024-05-13 DIAGNOSIS — Z1231 Encounter for screening mammogram for malignant neoplasm of breast: Secondary | ICD-10-CM

## 2024-05-13 NOTE — Progress Notes (Signed)
 Subjective:    Patient ID: Nancy Robertson, female    DOB: July 17, 1966, 58 y.o.   MRN: [id number]  HPI The patient comes in today for a wellness visit.    A review of their health history was completed.  A review of medications was also completed.  Any needed refills: none  Eating habits: excellent  Falls/  MVA accidents in past few months: no  Regular exercise: stays active  Specialist pt sees on regular basis: [facility] - weight loss and hormone therapy; on Tirzepatide  compound; also on estrogen and testosterone  pellets  Preventative health issues were discussed.   Additional concerns:  cervical exam and breast exam routine Mother has breast cancer; patient has had negative genetic screening Had hysterectomy and BSO. History of abnormal PAP and atypical cells on endometrial biopsy. Had 3 PAP smears after this which were negative. History of lichen sclerosis. Has not used Clobetasol  in a long time.  Takes vitamin D  supplement.  Regular skin cancer screening with dermatology. Married, same female sexual partner.  Colonoscopy up to date. Due for yearly mammogram. Regular vision and dental exams.  Labs done for insurance in May; states Dr. Glendia has reviewed and discussed with her.    Review of Systems  Constitutional:  Negative for activity change and appetite change.  HENT:  Negative for sore throat and trouble swallowing.   Respiratory:  Negative for cough, chest tightness, shortness of breath and wheezing.        Rare use of Symbicort ; mainly when excessively hot  Cardiovascular:  Negative for chest pain.  Gastrointestinal:  Negative for abdominal distention, abdominal pain, constipation, diarrhea, nausea and vomiting.       GERD stable with daily Protonix .   Genitourinary:  Negative for difficulty urinating, dysuria, enuresis, frequency, genital sores, pelvic pain, urgency and vaginal discharge.      05/13/2024    9:03 AM  Depression screen PHQ 2/9  Decreased  Interest 0  Down, Depressed, Hopeless 0  PHQ - 2 Score 0  Altered sleeping 0  Tired, decreased energy 0  Change in appetite 0  Feeling bad or failure about yourself  0  Trouble concentrating 0  Moving slowly or fidgety/restless 0  Suicidal thoughts 0  PHQ-9 Score 0  Difficult doing work/chores Not difficult at all      05/13/2024    9:04 AM 05/12/2023    9:48 AM 01/13/2023    4:30 PM 12/09/2022    8:29 AM  GAD 7 : Generalized Anxiety Score  Nervous, Anxious, on Edge 0 0 0 1  Control/stop worrying 0 0 2 0  Worry too much - different things 0 0 0 0  Trouble relaxing 0 0 0 0  Restless 0 0 0 0  Easily annoyed or irritable 0 0 2 0  Afraid - awful might happen 0 0 0 0  Total GAD 7 Score 0 0 4 1  Anxiety Difficulty Not difficult at all  Not difficult at all Not difficult at all    Social History   Tobacco Use   Smoking status: Never   Smokeless tobacco: Never  Vaping Use   Vaping status: Never Used  Substance Use Topics   Alcohol use: Yes    Comment: occasionally wine    Drug use: No        Objective:   Physical Exam Vitals and nursing note reviewed. Chaperone present: defers chaperone.  Constitutional:      General: She is not in acute distress.  Appearance: She is well-developed.  Neck:     Thyroid : No thyromegaly.     Trachea: No tracheal deviation.     Comments: Thyroid  non tender to palpation. No mass or goiter noted.  Cardiovascular:     Rate and Rhythm: Normal rate and regular rhythm.     Heart sounds: Normal heart sounds. No murmur heard. Pulmonary:     Effort: Pulmonary effort is normal.     Breath sounds: Normal breath sounds.  Chest:  Breasts:    Right: No swelling, inverted nipple, mass, skin change or tenderness.     Left: No swelling, inverted nipple, mass, skin change or tenderness.     Comments: Dense tissue bilaterally.  Abdominal:     General: There is no distension.     Palpations: Abdomen is soft.     Tenderness: There is no abdominal  tenderness.  Genitourinary:    General: Normal vulva.     Vagina: No vaginal discharge.     Comments: External GU: pink, no rashes or lesions; no signs of lichen sclerosis. Vagina: pink and moist. No discharge. Vaginal cuff: normal. Bimanual exam: no tenderness or obvious masses.  Musculoskeletal:     Cervical back: Normal range of motion and neck supple.  Lymphadenopathy:     Cervical: No cervical adenopathy.     Upper Body:     Right upper body: No supraclavicular, axillary or pectoral adenopathy.     Left upper body: No supraclavicular, axillary or pectoral adenopathy.  Skin:    General: Skin is warm and dry.     Findings: No rash.  Neurological:     Mental Status: She is alert and oriented to person, place, and time.  Psychiatric:        Mood and Affect: Mood normal.        Behavior: Behavior normal.        Thought Content: Thought content normal.        Judgment: Judgment normal.    Today's Vitals   05/13/24 0839  BP: 99/61  Temp: 98.4 F (36.9 C)  SpO2: 98%  Weight: 168 lb (76.2 kg)  Height: 5' 4.5 (1.638 m)   Body mass index is 28.39 kg/m.        Assessment & Plan:   Problem List Items Addressed This Visit       Other   Lichen sclerosus et atrophicus   Other Visit Diagnoses       Well woman exam    -  Primary     Immunization due       Relevant Orders   Pneumococcal conjugate vaccine 20-valent (Prevnar 20) (Completed)      Prevnar 20 vaccine today. Discussed potential risks associated with HRT. Adult wellness-complete.wellness physical was conducted today. Importance of diet and exercise were discussed in detail.  Importance of stress reduction and healthy living were discussed.  In addition to this a discussion regarding safety was also covered.  We also reviewed over immunizations and gave recommendations regarding current immunization needed for age.  Return in about 1 year (around 05/13/2025) for physical.

## 2024-05-14 ENCOUNTER — Encounter: Payer: Self-pay | Admitting: Nurse Practitioner

## 2024-05-19 ENCOUNTER — Ambulatory Visit
Admission: EM | Admit: 2024-05-19 | Discharge: 2024-05-19 | Disposition: A | Discharge status: Home / Self Care | Attending: Family Medicine | Admitting: Family Medicine

## 2024-05-19 DIAGNOSIS — W57XXXA Bitten or stung by nonvenomous insect and other nonvenomous arthropods, initial encounter: Secondary | ICD-10-CM | POA: Diagnosis not present

## 2024-05-19 DIAGNOSIS — L237 Allergic contact dermatitis due to plants, except food: Secondary | ICD-10-CM | POA: Diagnosis not present

## 2024-05-19 MED ORDER — METHYLPREDNISOLONE ACETATE 80 MG/ML IJ SUSP
80.0000 mg | Freq: Once | INTRAMUSCULAR | Status: AC
  Administered 2024-05-19: 80 mg via INTRAMUSCULAR

## 2024-05-19 MED ORDER — CLOBETASOL PROPIONATE 0.05 % EX OINT
1.0000 | TOPICAL_OINTMENT | Freq: Two times a day (BID) | CUTANEOUS | 0 refills | Status: AC
Start: 2024-05-19 — End: ?

## 2024-05-19 NOTE — ED Triage Notes (Signed)
 Pt reports she has had small ticks all over her body x 5 days

## 2024-05-20 ENCOUNTER — Encounter: Payer: Self-pay | Admitting: Family Medicine

## 2024-05-21 ENCOUNTER — Other Ambulatory Visit: Payer: Self-pay | Admitting: Family Medicine

## 2024-05-21 ENCOUNTER — Other Ambulatory Visit: Payer: Self-pay

## 2024-05-21 MED ORDER — HYDROXYZINE PAMOATE 25 MG PO CAPS: ORAL_CAPSULE | ORAL | 1 refills | Status: DC

## 2024-05-21 MED ORDER — PREDNISONE 20 MG PO TABS
ORAL_TABLET | ORAL | 0 refills | Status: DC
Start: 2024-05-21 — End: 2024-08-20

## 2024-05-21 NOTE — ED Provider Notes (Signed)
 [facility]    CSN: [id number] Arrival date & time: 05/19/24  9148      History   Chief Complaint No chief complaint on file.   HPI Nancy Robertson is a 58 y.o. female.   Patient presenting today with about 5 days of finding multiple small ticks across body after doing significant yard work the past week or so. Has been able to fully remove them but states they have left behind lingering red rashes and now she has some rash areas spreading to arms and legs. Denies significant itching, burning, drainage, fever, chills, So far trying neosporin with minimal relief.    Past Medical History:  Diagnosis Date   Allergy    Anxiety    Asthma    seasonal only   Chest pain 04/17/2012   Depression    Dyspepsia 03/27/2019   Family history of breast cancer 08/19/2022   Family history of ovarian cancer 08/19/2022   Fatty liver    Genetic testing 09/21/2022   GERD (gastroesophageal reflux disease)    Infertility, female    Mixed hyperlipidemia 04/17/2012   Palpitations 04/17/2012   Sleep apnea    TMJ (dislocation of temporomandibular joint) 04/17/2012    Patient Active Problem List   Diagnosis Date Noted   Obesity (BMI 30.0-34.9) 01/14/2023   Genetic testing 09/21/2022   Family history of CHEK2 gene mutation 08/19/2022   Family history of breast cancer 08/19/2022   Family history of ovarian cancer 08/19/2022   Sleep apnea 04/29/2022   Lichen sclerosus et atrophicus 04/29/2022   Seasonal allergic conjunctivitis 03/25/2022   Seasonal and perennial allergic rhinitis 09/13/2021   Penicillin allergy 09/13/2021   Food intolerance 09/13/2021   Systolic murmur 08/30/2021   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   Irritable bowel syndrome with constipation 07/03/2021   Gastroesophageal reflux disease 07/02/2021   Anxiety and depression 07/02/2021   Moderate persistent asthma, uncomplicated 10/16/2020   Dyspepsia 03/27/2019   Hypertriglyceridemia 01/21/2014   Renal cyst, left  07/15/2013   Chronic low back pain 04/29/2013   Ischemic colitis (HCC) 12/11/2012   Chest pain 04/17/2012   Mixed hyperlipidemia 04/17/2012   Palpitations 04/17/2012    Past Surgical History:  Procedure Laterality Date   BLADDER REPAIR  2010   BREAST BIOPSY Right    2020   COLONOSCOPY N/A 07/07/2016   Procedure: COLONOSCOPY;  Surgeon: Claudis RAYMOND Rivet, MD;  Location: [facility];  Service: Endoscopy;  Laterality: N/A;  1030    CYSTOSCOPY  11/10/2011   Procedure: CYSTOSCOPY;  Surgeon: Jon CINDERELLA Rummer, MD;  Location: [facility];  Service: Gynecology;  Laterality: N/A;   KNEE SURGERY  2007   right   SALPINGOOPHORECTOMY  11/10/2011   Procedure: SALPINGO OOPHERECTOMY;  Surgeon: Jon CINDERELLA Rummer, MD;  Location: [facility];  Service: Gynecology;  Laterality: Bilateral;   VAGINAL HYSTERECTOMY  11/10/2011   Procedure: HYSTERECTOMY VAGINAL;  Surgeon: Jon CINDERELLA Rummer, MD;  Location: [facility];  Service: Gynecology;  Laterality: N/A;  Total Vaginal Hysterectomy, bilateral salpingo-oopherectomy, cystoscopy    OB History     Gravida  2   Para  2   Term      Preterm      AB      Living         SAB      IAB      Ectopic      Multiple      Live Births  Home Medications    Prior to Admission medications   Medication Sig Start Date End Date Taking? Authorizing Provider  clobetasol  ointment (TEMOVATE ) 0.05 % Apply 1 Application topically 2 (two) times daily. Avoid use on face or private areas 05/19/24  Yes Stuart Vernell Norris, PA-C  Azelastine -Fluticasone  137-50 MCG/ACT SUSP Place 2 sprays into the nose in the morning and at bedtime. 01/12/24   Iva Marty Saltness, MD  budesonide -formoterol  (SYMBICORT ) 160-4.5 MCG/ACT inhaler USE 2 INHALATIONS TWICE A DAY 12/29/23   Alphonsa Glendia LABOR, MD  clobetasol  (TEMOVATE ) 0.05 % external solution  09/04/23   [provider]  fexofenadine (ALLEGRA) 180 MG tablet Take 180 mg by mouth daily.    [provider]   finasteride (PROSCAR) 5 MG tablet Take 5 mg by mouth daily. 12/29/23   [provider]  pantoprazole  (PROTONIX ) 40 MG tablet TAKE 1 TABLET DAILY 02/21/23   Alphonsa Glendia LABOR, MD  predniSONE  (DELTASONE ) 20 MG tablet 3 daily for 2 days then 2 daily for 3 days then 1 daily for 3 days 05/21/24   Alphonsa Glendia LABOR, MD  progesterone  (PROMETRIUM ) 100 MG capsule Take 100 mg by mouth daily. 05/10/24   [provider]  rosuvastatin  (CRESTOR ) 10 MG tablet Take 1 tablet by mouth daily for cholesterol. 04/08/24   Mauro Elveria BROCKS, NP    Family History Family History  Problem Relation Age of Onset   Breast cancer Mother        two primaries; dx 23 and 85; CHEK2 mutation   Hypertension Father    Diabetes Father    Hyperlipidemia Father    Lung cancer Father    COPD Father    Stroke Father    Colon polyps Father    Ovarian cancer Maternal Aunt 40   Bladder Cancer Paternal Aunt        father's pat half sibling   Cancer Paternal Aunt        unknown primary/ father's mat half sibling   Bladder Cancer Paternal Uncle    Prostate cancer Paternal Uncle    Cancer Other        PGF's siblings w/ breast, stomach, and lung cancers   Lymphoma Other        MGM's sister   Cancer Cousin        paternal cousins w/ prostate, throat, skin, lung cancers   Colon cancer Neg Hx    Esophageal cancer Neg Hx    Liver disease Neg Hx    Pancreatic cancer Neg Hx     Social History Social History   Tobacco Use   Smoking status: Never   Smokeless tobacco: Never  Vaping Use   Vaping status: Never Used  Substance Use Topics   Alcohol use: Yes    Comment: occasionally wine    Drug use: No     Allergies   Azithromycin and Erythromycin   Review of Systems Review of Systems PER HPI  Physical Exam Triage Vital Signs ED Triage Vitals [05/19/24 0858]  Encounter Vitals Group     BP 108/70     Girls Systolic BP Percentile      Girls Diastolic BP Percentile      Boys Systolic BP Percentile       Boys Diastolic BP Percentile      Pulse Rate 63     Resp 20     Temp 97.7 F (36.5 C)     Temp Source Oral     SpO2 97 %  Weight      Height      Head Circumference      Peak Flow      Pain Score 0     Pain Loc      Pain Education      Exclude from Growth Chart    No data found.  Updated Vital Signs BP 108/70 (BP Location: Right Arm)   Pulse 63   Temp 97.7 F (36.5 C) (Oral)   Resp 20   LMP 11/04/2011   SpO2 97%   Visual Acuity Right Eye Distance:   Left Eye Distance:   Bilateral Distance:    Right Eye Near:   Left Eye Near:    Bilateral Near:     Physical Exam Vitals and nursing note reviewed.  Constitutional:      Appearance: Normal appearance. She is not ill-appearing.  HENT:     Head: Atraumatic.  Eyes:     Extraocular Movements: Extraocular movements intact.     Conjunctiva/sclera: Conjunctivae normal.  Cardiovascular:     Rate and Rhythm: Normal rate.  Pulmonary:     Effort: Pulmonary effort is normal.     Breath sounds: Normal breath sounds.  Musculoskeletal:        General: Normal range of motion.     Cervical back: Normal range of motion and neck supple.  Skin:    General: Skin is warm and dry.     Findings: Rash present.     Comments: Several erythematous papular lesions to extremities in sites of tick bites, no remaining portions of tick remaining. Several other areas of erythematous linear blistering and papular rashes to extremities   Neurological:     Mental Status: She is alert and oriented to person, place, and time.  Psychiatric:        Mood and Affect: Mood normal.        Thought Content: Thought content normal.        Judgment: Judgment normal.      UC Treatments / Results  Labs (all labs ordered are listed, but only abnormal results are displayed) Labs Reviewed - No data to display  EKG   Radiology No results found.  Procedures Procedures (including critical care time)  Medications Ordered in UC Medications   methylPREDNISolone  acetate (DEPO-MEDROL ) injection 80 mg (80 mg Intramuscular Given 05/19/24 0935)    Initial Impression / Assessment and Plan / UC Course  I have reviewed the triage vital signs and the nursing notes.  Pertinent labs & imaging results that were available during my care of the patient were reviewed by me and considered in my medical decision making (see chart for details).     Consistent with tick bite localized reactions as well as some developing and spreading poison ivy dermatitis. Treat with IM depo medrol , clobetasol  cream, supportive home care. Return for worsening sxs.  Final Clinical Impressions(s) / UC Diagnoses   Final diagnoses:  Poison ivy dermatitis  Tick bite, unspecified site, initial encounter   Discharge Instructions   None    ED Prescriptions     Medication Sig Dispense Auth. Provider   clobetasol  ointment (TEMOVATE ) 0.05 % Apply 1 Application topically 2 (two) times daily. Avoid use on face or private areas 60 g Stuart Vernell Norris, [place]      PDMP not reviewed this encounter.   Stuart Vernell Norris, [place] 05/21/24 2113

## 2024-05-21 NOTE — Telephone Encounter (Signed)
 Nurses I read through the message  Please send in prednisone  20 mg tablet 3 daily for 2 days then 2 daily for 3 days then 1 daily for 3 days #15  You may also share the following message with Kalle  Hi Nancy Robertson  Sorry you are having troubles-prednisone  tablets tapered over 7 days should help this go away quicker.  I would not recommend additional shots.  Should you have any ongoing troubles or problems let me know  Hope the wedding went well  TakeCare-Dr. Glendia

## 2024-06-05 ENCOUNTER — Encounter: Payer: Self-pay | Admitting: Family Medicine

## 2024-06-05 NOTE — Telephone Encounter (Signed)
 Hi Elivia Actually after your first message when I sent you the response I also stated I would reach out to Dr. Lanny  I did in fact do so She responded that she will also discuss with the breast center to see what can be ordered up  I also see where Elveria responded to your message as well Currently we have multiple different input regarding this issue I would recommend that we wait to see what Dr.Feng is recommending then at that point should be able to have a cohesive plan  Unfortunately insurance companies often do not approve advanced testing unless screening test show a problem Quite frankly the cost factor I am just not aware of regarding contrast mammogram versus MRI of the breast  With your genetics being negative that is reassuring but as you are aware breast cancer still has high prevalence for many people despite their genetics  Once I hear back again from Dr.Feng I will include you in on the loop. It would be fine for you to connect with the breast center as well to see what you find out. Thanks-Dicy Smigel

## 2024-06-06 ENCOUNTER — Encounter: Payer: Self-pay | Admitting: Nurse Practitioner

## 2024-06-15 ENCOUNTER — Other Ambulatory Visit: Payer: Self-pay | Admitting: Nurse Practitioner

## 2024-06-19 DIAGNOSIS — R799 Abnormal finding of blood chemistry, unspecified: Secondary | ICD-10-CM | POA: Diagnosis not present

## 2024-06-19 DIAGNOSIS — E559 Vitamin D deficiency, unspecified: Secondary | ICD-10-CM | POA: Diagnosis not present

## 2024-06-19 DIAGNOSIS — D518 Other vitamin B12 deficiency anemias: Secondary | ICD-10-CM | POA: Diagnosis not present

## 2024-06-19 DIAGNOSIS — R946 Abnormal results of thyroid function studies: Secondary | ICD-10-CM | POA: Diagnosis not present

## 2024-07-07 DIAGNOSIS — R030 Elevated blood-pressure reading, without diagnosis of hypertension: Secondary | ICD-10-CM | POA: Diagnosis not present

## 2024-07-18 ENCOUNTER — Encounter: Payer: Self-pay | Admitting: Nurse Practitioner

## 2024-07-18 ENCOUNTER — Encounter: Payer: Self-pay | Admitting: Allergy & Immunology

## 2024-07-18 MED ORDER — BUDESONIDE-FORMOTEROL FUMARATE 160-4.5 MCG/ACT IN AERO: INHALATION_SPRAY | RESPIRATORY_TRACT | 1 refills | Status: AC

## 2024-07-22 ENCOUNTER — Other Ambulatory Visit: Payer: Self-pay | Admitting: Nurse Practitioner

## 2024-07-23 ENCOUNTER — Other Ambulatory Visit: Payer: Self-pay | Admitting: Nurse Practitioner

## 2024-07-23 DIAGNOSIS — R9230 Dense breasts, unspecified: Secondary | ICD-10-CM

## 2024-07-23 DIAGNOSIS — Z803 Family history of malignant neoplasm of breast: Secondary | ICD-10-CM

## 2024-07-23 DIAGNOSIS — Z8041 Family history of malignant neoplasm of ovary: Secondary | ICD-10-CM

## 2024-07-23 DIAGNOSIS — Z8481 Family history of carrier of genetic disease: Secondary | ICD-10-CM

## 2024-08-08 DIAGNOSIS — D3132 Benign neoplasm of left choroid: Secondary | ICD-10-CM | POA: Diagnosis not present

## 2024-08-08 DIAGNOSIS — H43813 Vitreous degeneration, bilateral: Secondary | ICD-10-CM | POA: Diagnosis not present

## 2024-08-08 DIAGNOSIS — H43391 Other vitreous opacities, right eye: Secondary | ICD-10-CM | POA: Diagnosis not present

## 2024-08-08 DIAGNOSIS — H35423 Microcystoid degeneration of retina, bilateral: Secondary | ICD-10-CM | POA: Diagnosis not present

## 2024-08-20 ENCOUNTER — Inpatient Hospital Stay: Attending: Hematology and Oncology | Admitting: Hematology and Oncology

## 2024-08-20 ENCOUNTER — Inpatient Hospital Stay

## 2024-08-20 VITALS — BP 114/52 | HR 68 | Temp 97.8°F | Resp 18 | Ht 64.5 in | Wt 174.7 lb

## 2024-08-20 DIAGNOSIS — Z8041 Family history of malignant neoplasm of ovary: Secondary | ICD-10-CM | POA: Diagnosis not present

## 2024-08-20 DIAGNOSIS — Z9189 Other specified personal risk factors, not elsewhere classified: Secondary | ICD-10-CM | POA: Diagnosis not present

## 2024-08-20 DIAGNOSIS — Z90710 Acquired absence of both cervix and uterus: Secondary | ICD-10-CM | POA: Diagnosis not present

## 2024-08-20 DIAGNOSIS — Z803 Family history of malignant neoplasm of breast: Secondary | ICD-10-CM | POA: Diagnosis not present

## 2024-08-20 DIAGNOSIS — R9230 Dense breasts, unspecified: Secondary | ICD-10-CM | POA: Diagnosis not present

## 2024-08-20 NOTE — Progress Notes (Signed)
 [facility] CONSULT NOTE  Patient Care Team: Alphonsa Glendia LABOR, MD as PCP - General  CHIEF COMPLAINTS/PURPOSE OF CONSULTATION:  At high risk for breast cancer  HISTORY OF PRESENTING ILLNESS:  History of Present Illness Nancy Robertson is a 58 year old female who presents for evaluation of breast cancer risk and surveillance due to family history of breast cancer. She was referred for evaluation of breast density and high-risk assessment.  She has undergone two breast biopsies on the same breast, in June 2014 and December 2022, both of which were negative for cancer. She has extremely dense breast tissue and undergoes annual mammograms.  Her mother was diagnosed with breast cancer at age 68, with recurrences in 2023 and 2025. Genetic testing revealed that she and her brother do not carry the mutation her mother has. Her maternal grandmother had dense breast tissue and benign lumps removed. There is a family history of ovarian cancer on her mother's side.  Menarche occurred at age 60, and she had her first child at age 18. A complete hysterectomy was performed in January 2013 due to abnormal cells and fibrocystic changes, influenced by a family history of ovarian cancer. She initially used hormone replacement therapy for two years post-hysterectomy and resumed hormone therapy with estrogen and testosterone  pellets, along with oral progesterone , about a year ago.     I reviewed her records extensively and collaborated the history with the patient.  SUMMARY OF ONCOLOGIC HISTORY: Oncology History   No history exists.     MEDICAL HISTORY:  Past Medical History:  Diagnosis Date   Allergy    Anxiety    Asthma    seasonal only   Chest pain 04/17/2012   Depression    Dyspepsia 03/27/2019   Family history of breast cancer 08/19/2022   Family history of ovarian cancer 08/19/2022   Fatty liver    Genetic testing 09/21/2022   GERD (gastroesophageal reflux disease)     Infertility, female    Mixed hyperlipidemia 04/17/2012   Palpitations 04/17/2012   Sleep apnea    TMJ (dislocation of temporomandibular joint) 04/17/2012    SURGICAL HISTORY: Past Surgical History:  Procedure Laterality Date   BLADDER REPAIR  2010   BREAST BIOPSY Right    2020   COLONOSCOPY N/A 07/07/2016   Procedure: COLONOSCOPY;  Surgeon: Claudis RAYMOND Rivet, MD;  Location: [facility];  Service: Endoscopy;  Laterality: N/A;  1030    CYSTOSCOPY  11/10/2011   Procedure: CYSTOSCOPY;  Surgeon: Jon CINDERELLA Rummer, MD;  Location: [facility];  Service: Gynecology;  Laterality: N/A;   KNEE SURGERY  2007   right   SALPINGOOPHORECTOMY  11/10/2011   Procedure: SALPINGO OOPHERECTOMY;  Surgeon: Jon CINDERELLA Rummer, MD;  Location: [facility];  Service: Gynecology;  Laterality: Bilateral;   VAGINAL HYSTERECTOMY  11/10/2011   Procedure: HYSTERECTOMY VAGINAL;  Surgeon: Jon CINDERELLA Rummer, MD;  Location: [facility];  Service: Gynecology;  Laterality: N/A;  Total Vaginal Hysterectomy, bilateral salpingo-oopherectomy, cystoscopy    SOCIAL HISTORY: Social History   Socioeconomic History   Marital status: Married    Spouse name: Not on file   Number of children: 2   Years of education: Not on file   Highest education level: 12th grade  Occupational History   Not on file  Tobacco Use   Smoking status: Never   Smokeless tobacco: Never  Vaping Use   Vaping status: Never Used  Substance and Sexual Activity   Alcohol use: Yes  Comment: occasionally wine    Drug use: No   Sexual activity: Yes    Birth control/protection: Surgical  Other Topics Concern   Not on file  Social History Narrative   Not on file   Social Drivers of Health   Financial Resource Strain: Low Risk  (05/10/2024)   Overall Financial Resource Strain (CARDIA)    Difficulty of Paying Living Expenses: Not hard at all  Food Insecurity: No Food Insecurity (08/19/2024)   Hunger Vital Sign    Worried About Running Out of Food in the Last Year:  Never true    Ran Out of Food in the Last Year: Never true  Transportation Needs: No Transportation Needs (08/19/2024)   PRAPARE - Administrator, Civil Service (Medical): No    Lack of Transportation (Non-Medical): No  Physical Activity: Inactive (05/10/2024)   Exercise Vital Sign    Days of Exercise per Week: 0 days    Minutes of Exercise per Session: Not on file  Stress: No Stress Concern Present (05/10/2024)   [employer] of Occupational Health - Occupational Stress Questionnaire    Feeling of Stress: Not at all  Social Connections: Socially Integrated (05/10/2024)   Social Connection and Isolation Panel    Frequency of Communication with Friends and Family: More than three times a week    Frequency of Social Gatherings with Friends and Family: More than three times a week    Attends Religious Services: More than 4 times per year    Active Member of [employer] or Organizations: Yes    Attends Engineer, Structural: More than 4 times per year    Marital Status: Married  Catering Manager Violence: Not on file    FAMILY HISTORY: Family History  Problem Relation Age of Onset   Breast cancer Mother        two primaries; dx 72 and 30; CHEK2 mutation   Hypertension Father    Diabetes Father    Hyperlipidemia Father    Lung cancer Father    COPD Father    Stroke Father    Colon polyps Father    Ovarian cancer Maternal Aunt 40   Bladder Cancer Paternal Aunt        father's pat half sibling   Cancer Paternal Aunt        unknown primary/ father's mat half sibling   Bladder Cancer Paternal Uncle    Prostate cancer Paternal Uncle    Cancer Other        PGF's siblings w/ breast, stomach, and lung cancers   Lymphoma Other        MGM's sister   Cancer Cousin        paternal cousins w/ prostate, throat, skin, lung cancers   Colon cancer Neg Hx    Esophageal cancer Neg Hx    Liver disease Neg Hx    Pancreatic cancer Neg Hx     ALLERGIES:  is allergic to  azithromycin and erythromycin.  MEDICATIONS:  Current Outpatient Medications  Medication Sig Dispense Refill   Azelastine -Fluticasone  137-50 MCG/ACT SUSP Place 2 sprays into the nose in the morning and at bedtime. 23 g 5   budesonide -formoterol  (SYMBICORT ) 160-4.5 MCG/ACT inhaler USE 2 INHALATIONS TWICE A DAY 3 each 1   clobetasol  ointment (TEMOVATE ) 0.05 % Apply 1 Application topically 2 (two) times daily. Avoid use on face or private areas (Patient not taking: Reported on 08/20/2024) 60 g 0   fexofenadine (ALLEGRA) 180 MG tablet Take 180 mg  by mouth daily.     finasteride (PROSCAR) 5 MG tablet Take 5 mg by mouth daily.     pantoprazole  (PROTONIX ) 40 MG tablet TAKE 1 TABLET DAILY 90 tablet 0   progesterone  (PROMETRIUM ) 100 MG capsule Take 100 mg by mouth daily.     rosuvastatin  (CRESTOR ) 10 MG tablet Take 1 tablet by mouth daily for cholesterol. 90 tablet 0   No current facility-administered medications for this visit.    REVIEW OF SYSTEMS:   Constitutional: Denies fevers, chills or abnormal night sweats Breast: Lumpiness in the right breast upper outer quadrant All other systems were reviewed with the patient and are negative.  PHYSICAL EXAMINATION: ECOG PERFORMANCE STATUS: 0 - Asymptomatic  Vitals:   08/20/24 1301  BP: (!) 114/52  Pulse: 68  Resp: 18  Temp: 97.8 F (36.6 C)  SpO2: 97%   Filed Weights   08/20/24 1301  Weight: 174 lb 11.2 oz (79.2 kg)    GENERAL:alert, no distress and comfortable BREAST: No palpable nodules in breast. No palpable axillary or supraclavicular lymphadenopathy (exam performed in the presence of a chaperone)   LABORATORY DATA:  I have reviewed the data as listed Lab Results  Component Value Date   WBC 4.7 02/01/2023   HGB 13.7 02/01/2023   HCT 40.6 02/01/2023   MCV 87 02/01/2023   PLT 201 02/01/2023   Lab Results  Component Value Date   NA 142 02/01/2023   K 4.3 02/01/2023   CL 103 02/01/2023   CO2 23 02/01/2023    RADIOGRAPHIC  STUDIES: I have personally reviewed the radiological reports and agreed with the findings in the report.  ASSESSMENT AND PLAN:  Assessment and Plan Assessment & Plan Elevated breast cancer risk Family history, previous biopsies, and dense breast tissue increase risk. Five-year risk 4.1%, ten-year risk 13.2%, lifetime risk 25.4%. No BRCA mutations. Current breast density category C. Previous biopsies showed fibrocystic changes. Has history of hormone replacement therapy. Discussed non-pharmacological risk reduction and surveillance options. - Ordered annual contrast-enhanced mammogram (CEM). - Scheduled follow-up in one year for surveillance and risk assessment. - Encouraged lifestyle modifications: reduce alcohol intake, increase exercise, reduce red meat consumption, increase fruits and vegetables. - Recommended vitamin D  and turmeric supplementation.  Return to clinic in 1 year for follow-up   All questions were answered. The patient knows to call the clinic with any problems, questions or concerns. I personally spent a total of 30 minutes in the care of the patient today including preparing to see the patient, getting/reviewing separately obtained history, performing a medically appropriate exam/evaluation, counseling and educating, placing orders, referring and communicating with other health care professionals, documenting clinical information in the EHR, independently interpreting results, communicating results, and coordinating care.   Viinay K Laythan Hayter, MD 08/20/24

## 2024-08-30 ENCOUNTER — Ambulatory Visit
Admission: RE | Admit: 2024-08-30 | Discharge: 2024-08-30 | Disposition: A | Discharge status: Home / Self Care | Source: Ambulatory Visit | Attending: Hematology and Oncology | Admitting: Hematology and Oncology

## 2024-08-30 DIAGNOSIS — R928 Other abnormal and inconclusive findings on diagnostic imaging of breast: Secondary | ICD-10-CM | POA: Diagnosis not present

## 2024-08-30 DIAGNOSIS — Z9189 Other specified personal risk factors, not elsewhere classified: Secondary | ICD-10-CM

## 2024-08-30 DIAGNOSIS — Z1239 Encounter for other screening for malignant neoplasm of breast: Secondary | ICD-10-CM | POA: Diagnosis not present

## 2024-08-30 MED ORDER — IOPAMIDOL (ISOVUE-370) INJECTION 76%
100.0000 mL | Freq: Once | INTRAVENOUS | Status: AC | PRN
  Administered 2024-08-30: 100 mL via INTRAVENOUS

## 2024-09-04 ENCOUNTER — Other Ambulatory Visit: Payer: Self-pay | Admitting: Family Medicine

## 2024-09-09 DIAGNOSIS — H43391 Other vitreous opacities, right eye: Secondary | ICD-10-CM | POA: Diagnosis not present

## 2024-09-09 DIAGNOSIS — H43813 Vitreous degeneration, bilateral: Secondary | ICD-10-CM | POA: Diagnosis not present

## 2024-09-09 DIAGNOSIS — D3132 Benign neoplasm of left choroid: Secondary | ICD-10-CM | POA: Diagnosis not present

## 2024-09-09 DIAGNOSIS — H35423 Microcystoid degeneration of retina, bilateral: Secondary | ICD-10-CM | POA: Diagnosis not present

## 2024-10-14 ENCOUNTER — Telehealth: Payer: Self-pay | Admitting: Family Medicine

## 2024-10-14 NOTE — Telephone Encounter (Signed)
 Received a call from [id number] who had the patient on the other line. She transferred patient to me.   [facility] states [facility] told her to call and ask us  to re-code the MM 3D DIAG BILAT WITH CONTRAST. She states that they are saying Elveria Quarry is the provider who referred her for her mammogram. Elveria referred her to [facility] Dr. Odean, who is the one that referred her for this procedure. I advised patient she would need to contact Dr. Gara office. Patient verbalized understanding.

## 2025-01-15 ENCOUNTER — Ambulatory Visit: Admitting: Allergy & Immunology

## 2025-05-13 ENCOUNTER — Encounter: Admitting: Nurse Practitioner

## 2025-08-20 ENCOUNTER — Inpatient Hospital Stay: Admitting: Hematology and Oncology
